# Patient Record
Sex: Female | Born: 1955
Health system: Southern US, Community
[De-identification: ages and names within clinical notes are randomized; demographics above are authoritative.]

## PROBLEM LIST (undated history)

## (undated) DIAGNOSIS — I444 Left anterior fascicular block: Secondary | ICD-10-CM

## (undated) DIAGNOSIS — E119 Type 2 diabetes mellitus without complications: Secondary | ICD-10-CM

## (undated) DIAGNOSIS — E079 Disorder of thyroid, unspecified: Secondary | ICD-10-CM

## (undated) DIAGNOSIS — J45909 Unspecified asthma, uncomplicated: Secondary | ICD-10-CM

## (undated) DIAGNOSIS — I10 Essential (primary) hypertension: Secondary | ICD-10-CM

## (undated) DIAGNOSIS — K219 Gastro-esophageal reflux disease without esophagitis: Secondary | ICD-10-CM

## (undated) DIAGNOSIS — E785 Hyperlipidemia, unspecified: Secondary | ICD-10-CM

## (undated) DIAGNOSIS — G4730 Sleep apnea, unspecified: Secondary | ICD-10-CM

## (undated) DIAGNOSIS — K589 Irritable bowel syndrome without diarrhea: Secondary | ICD-10-CM

## (undated) HISTORY — DX: Type 2 diabetes mellitus without complications: E11.9

## (undated) HISTORY — DX: Essential (primary) hypertension: I10

## (undated) HISTORY — DX: Hyperlipidemia, unspecified: E78.5

## (undated) HISTORY — DX: Unspecified asthma, uncomplicated: J45.909

## (undated) HISTORY — DX: Disorder of thyroid, unspecified: E07.9

## (undated) HISTORY — DX: Left anterior fascicular block: I44.4

---

## 1978-08-26 HISTORY — PX: TONSILLECTOMY: SUR1361

## 1988-08-26 HISTORY — PX: FUNCTIONAL ENDOSCOPIC SINUS SURGERY: SUR616

## 1998-02-07 ENCOUNTER — Other Ambulatory Visit: Admission: RE | Admit: 1998-02-07 | Discharge: 1998-02-07 | Payer: Self-pay | Admitting: Obstetrics and Gynecology

## 1998-06-12 ENCOUNTER — Encounter: Payer: Self-pay | Admitting: Orthopedic Surgery

## 1998-06-12 ENCOUNTER — Ambulatory Visit (HOSPITAL_COMMUNITY): Admission: RE | Admit: 1998-06-12 | Discharge: 1998-06-12 | Payer: Self-pay | Admitting: Orthopedic Surgery

## 1998-09-04 ENCOUNTER — Ambulatory Visit (HOSPITAL_BASED_OUTPATIENT_CLINIC_OR_DEPARTMENT_OTHER): Admission: RE | Admit: 1998-09-04 | Discharge: 1998-09-04 | Payer: Self-pay | Admitting: *Deleted

## 1998-10-19 ENCOUNTER — Ambulatory Visit (HOSPITAL_COMMUNITY): Admission: RE | Admit: 1998-10-19 | Discharge: 1998-10-19 | Payer: Self-pay | Admitting: *Deleted

## 1998-10-27 ENCOUNTER — Encounter: Payer: Self-pay | Admitting: Family Medicine

## 1998-10-27 ENCOUNTER — Ambulatory Visit (HOSPITAL_COMMUNITY): Admission: RE | Admit: 1998-10-27 | Discharge: 1998-10-27 | Payer: Self-pay | Admitting: Family Medicine

## 1998-11-02 ENCOUNTER — Ambulatory Visit (HOSPITAL_COMMUNITY): Admission: RE | Admit: 1998-11-02 | Discharge: 1998-11-02 | Payer: Self-pay | Admitting: Orthopedic Surgery

## 1998-12-22 ENCOUNTER — Ambulatory Visit (HOSPITAL_COMMUNITY): Admission: RE | Admit: 1998-12-22 | Discharge: 1998-12-22 | Payer: Self-pay | Admitting: Orthopaedic Surgery

## 1999-01-05 ENCOUNTER — Ambulatory Visit (HOSPITAL_COMMUNITY): Admission: RE | Admit: 1999-01-05 | Discharge: 1999-01-05 | Payer: Self-pay | Admitting: Orthopaedic Surgery

## 1999-01-19 ENCOUNTER — Ambulatory Visit (HOSPITAL_COMMUNITY): Admission: RE | Admit: 1999-01-19 | Discharge: 1999-01-19 | Payer: Self-pay | Admitting: Orthopaedic Surgery

## 1999-01-30 ENCOUNTER — Ambulatory Visit (HOSPITAL_COMMUNITY): Admission: RE | Admit: 1999-01-30 | Discharge: 1999-01-31 | Payer: Self-pay | Admitting: Orthopaedic Surgery

## 1999-03-06 ENCOUNTER — Other Ambulatory Visit: Admission: RE | Admit: 1999-03-06 | Discharge: 1999-03-06 | Payer: Self-pay | Admitting: Obstetrics and Gynecology

## 2000-03-06 ENCOUNTER — Other Ambulatory Visit: Admission: RE | Admit: 2000-03-06 | Discharge: 2000-03-06 | Payer: Self-pay | Admitting: Obstetrics and Gynecology

## 2000-07-16 ENCOUNTER — Ambulatory Visit (HOSPITAL_COMMUNITY): Admission: RE | Admit: 2000-07-16 | Discharge: 2000-07-16 | Payer: Self-pay | Admitting: Orthopaedic Surgery

## 2000-07-30 ENCOUNTER — Ambulatory Visit (HOSPITAL_COMMUNITY): Admission: RE | Admit: 2000-07-30 | Discharge: 2000-07-30 | Payer: Self-pay | Admitting: Orthopaedic Surgery

## 2000-08-14 ENCOUNTER — Ambulatory Visit (HOSPITAL_COMMUNITY): Admission: RE | Admit: 2000-08-14 | Discharge: 2000-08-14 | Payer: Self-pay | Admitting: *Deleted

## 2000-08-26 HISTORY — PX: LUMBAR LAMINECTOMY: SHX95

## 2001-02-04 ENCOUNTER — Encounter: Payer: Self-pay | Admitting: Gastroenterology

## 2001-02-04 ENCOUNTER — Ambulatory Visit (HOSPITAL_COMMUNITY): Admission: RE | Admit: 2001-02-04 | Discharge: 2001-02-04 | Payer: Self-pay | Admitting: Gastroenterology

## 2001-02-05 ENCOUNTER — Ambulatory Visit (HOSPITAL_COMMUNITY): Admission: RE | Admit: 2001-02-05 | Discharge: 2001-02-05 | Payer: Self-pay | Admitting: Gastroenterology

## 2001-02-12 ENCOUNTER — Encounter: Payer: Self-pay | Admitting: Gastroenterology

## 2001-02-12 ENCOUNTER — Ambulatory Visit (HOSPITAL_COMMUNITY): Admission: RE | Admit: 2001-02-12 | Discharge: 2001-02-12 | Payer: Self-pay | Admitting: Gastroenterology

## 2001-05-12 ENCOUNTER — Other Ambulatory Visit: Admission: RE | Admit: 2001-05-12 | Discharge: 2001-05-12 | Payer: Self-pay | Admitting: Obstetrics and Gynecology

## 2001-07-16 ENCOUNTER — Encounter: Admission: RE | Admit: 2001-07-16 | Discharge: 2001-07-16 | Payer: Self-pay | Admitting: Otolaryngology

## 2001-07-16 ENCOUNTER — Encounter: Payer: Self-pay | Admitting: Otolaryngology

## 2001-08-12 ENCOUNTER — Encounter: Admission: RE | Admit: 2001-08-12 | Discharge: 2001-08-12 | Payer: Self-pay | Admitting: Orthopaedic Surgery

## 2002-04-22 ENCOUNTER — Encounter: Payer: Self-pay | Admitting: Emergency Medicine

## 2002-04-22 ENCOUNTER — Emergency Department (HOSPITAL_COMMUNITY): Admission: EM | Admit: 2002-04-22 | Discharge: 2002-04-22 | Payer: Self-pay | Admitting: Emergency Medicine

## 2002-05-13 ENCOUNTER — Other Ambulatory Visit: Admission: RE | Admit: 2002-05-13 | Discharge: 2002-05-13 | Payer: Self-pay | Admitting: Obstetrics and Gynecology

## 2003-03-02 ENCOUNTER — Encounter: Payer: Self-pay | Admitting: Internal Medicine

## 2003-03-02 ENCOUNTER — Encounter: Admission: RE | Admit: 2003-03-02 | Discharge: 2003-03-02 | Payer: Self-pay | Admitting: Internal Medicine

## 2003-06-07 ENCOUNTER — Other Ambulatory Visit: Admission: RE | Admit: 2003-06-07 | Discharge: 2003-06-07 | Payer: Self-pay | Admitting: Obstetrics and Gynecology

## 2003-08-27 HISTORY — PX: LUMBAR LAMINECTOMY: SHX95

## 2003-09-16 ENCOUNTER — Ambulatory Visit (HOSPITAL_COMMUNITY): Admission: RE | Admit: 2003-09-16 | Discharge: 2003-09-17 | Payer: Self-pay | Admitting: Orthopaedic Surgery

## 2004-06-20 ENCOUNTER — Other Ambulatory Visit: Admission: RE | Admit: 2004-06-20 | Discharge: 2004-06-20 | Payer: Self-pay | Admitting: Obstetrics and Gynecology

## 2005-06-26 ENCOUNTER — Other Ambulatory Visit: Admission: RE | Admit: 2005-06-26 | Discharge: 2005-06-26 | Payer: Self-pay | Admitting: Obstetrics and Gynecology

## 2005-12-09 ENCOUNTER — Other Ambulatory Visit: Admission: RE | Admit: 2005-12-09 | Discharge: 2005-12-09 | Payer: Self-pay | Admitting: Obstetrics and Gynecology

## 2006-03-10 ENCOUNTER — Other Ambulatory Visit: Admission: RE | Admit: 2006-03-10 | Discharge: 2006-03-10 | Payer: Self-pay | Admitting: Obstetrics and Gynecology

## 2006-07-07 ENCOUNTER — Other Ambulatory Visit: Admission: RE | Admit: 2006-07-07 | Discharge: 2006-07-07 | Payer: Self-pay | Admitting: Obstetrics and Gynecology

## 2006-11-03 ENCOUNTER — Other Ambulatory Visit: Admission: RE | Admit: 2006-11-03 | Discharge: 2006-11-03 | Payer: Self-pay | Admitting: Obstetrics and Gynecology

## 2007-03-05 ENCOUNTER — Encounter: Admission: RE | Admit: 2007-03-05 | Discharge: 2007-03-05 | Payer: Self-pay | Admitting: Orthopaedic Surgery

## 2007-07-13 ENCOUNTER — Other Ambulatory Visit: Admission: RE | Admit: 2007-07-13 | Discharge: 2007-07-13 | Payer: Self-pay | Admitting: Obstetrics and Gynecology

## 2011-01-11 NOTE — Procedures (Signed)
[place]  Patient:    Heather Stout, Heather Stout                     MRN: [id number] Proc. Date: 02/05/01 Adm. Date:  [id number] Attending:  Charna Elizabeth CC:         Soyla Murphy. Renne Crigler, M.D.   Procedure Report  DATE OF BIRTH:  November 25, 1965  REFERRING PHYSICIAN:  Soyla Murphy. Renne Crigler, M.D.  PROCEDURE PERFORMED:  Colonoscopy.  ENDOSCOPIST:  Anselmo Rod, M.D.  INSTRUMENT USED:  Olympus video colonoscope.  INDICATIONS FOR PROCEDURE:  Rectal bleeding and severe diarrhea in a 55 year old white female rule out colonic polyps, masses, hemorrhoids, etc.  PREPROCEDURE PREPARATION:  Informed consent was procured from the patient. The patient was fasted for eight hours prior to the procedure and prepped with a bottle of magnesium citrate and a gallon of NuLytely the night prior to the procedure.  PREPROCEDURE PHYSICAL:  The patient had stable vital signs.  Neck supple. Chest clear to auscultation.  S1, S2 regular.  Abdomen soft with normal abdominal bowel sounds.  DESCRIPTION OF PROCEDURE:  The patient was placed in the left lateral decubitus position and sedated with 70 mg of Demerol and 7 mg of Versed intravenously.  Once the patient was adequately sedated and maintained on low-flow oxygen and continuous cardiac monitoring, the Olympus video colonoscope was advanced from the rectum to the cecum and terminal ileum without difficulty.  Except for small internal hemorrhoids, the entire colonic mucosa appeared healthy with a normal vascular pattern.  No erosions, ulcerations, masses or polyps were seen.  IMPRESSION:  Healthy-appearing colon up to the terminal ileum except for small nonbleeding internal hemorrhoids.  RECOMMENDATIONS: 1. It is my suspicion that the patient suffers from severe irritable bowel    syndrome, so a high fiber diet has been advised. 2. Outpatient follow-up is advised in the next two weeks or earlier if needed.DD:  02/05/01 TD:   02/06/01 Job: [id number]

## 2011-01-11 NOTE — Op Note (Signed)
NAME:  Heather Stout, Heather Stout                        ACCOUNT NO.:  [id number]   MEDICAL RECORD NO.:  [id number]                   PATIENT TYPE:  OIB   LOCATION:  2550                                 FACILITY:  [facility]   PHYSICIAN:  Mark C. Ophelia Charter, M.D.                 DATE OF BIRTH:  12/21/1955   DATE OF PROCEDURE:  09/16/2003  DATE OF DISCHARGE:                                 OPERATIVE REPORT   PREOPERATIVE DIAGNOSIS:  Left L5-S1 recurrent herniated nucleus pulposus.   POSTOPERATIVE DIAGNOSIS:  Left L5-S1 recurrent herniated nucleus pulposus.   PROCEDURE:  L5-S1 left microdiskectomy for recurrent herniated nucleus  pulposus.   SURGEON:  Mark C. Ophelia Charter, M.D.   ANESTHESIA:  General endotracheal anesthesia.   ESTIMATED BLOOD LOSS:  Less than 100 cc.   HISTORY:  This 55 year old female had an L5-S1 herniated nucleus pulposus  five years ago in June of 2000.  She did well for a period of time and has  had some recurrent episodes which have been severe for the last few months  with recurrent radicular symptoms and MRI positive for recurrent herniated  nucleus pulposus.  Discussion about fusion was held with the patient and  since she has adjacent disk degenerative changes at L4-5 as well, she  elected to proceed with recurrent micro diskectomy for her radicular  symptoms.  After the first procedure she did well for a period of about  three years.   DESCRIPTION OF PROCEDURE:  After the induction of general anesthesia,  preoperative Ancef prophylaxis, the patient was placed on the Andrews frame  in the kneeling position.  The back was prepped with Duraprep, squared with  towels, sterile skin marker used on the old skin, and Betadine and Ioban  drape application.  Laminectomy sheet and drape.  The back was exposed.  Subperiosteal dissection down to the interlaminar space on the left was  performed.  The #4 was placed lateral in the gutter.  Cross-table lateral x-  ray confirmed it was just  below the level of the disk space.  The bone was  cleaned off with cup curet.  Once the margin of the bone was identified,  laminotomy was performed taking almost all the lamina on the left at L5 and  the top portion of the S1 lamina.  The nerve root was identified and there  was some recurrent ligament in the gutter.  This was removed and the nerve  root was stuck down with a wad of disk material that was recurrent, sticking  to the anterior portion of the dura.  Using the operative microscope with  micro dissection technique, using the titanium micro dissection instruments  and a Black nerve hook, ball-tipped nerve hook, dural separator, and  D'Errico, and a 15-scalpel blade, piece by piece the recurrent disk fragment  was cut loose and it was gently separated from the dura with micro  dissection techniques.  This continued until there was complete  decompression.  The disk space was collapsed and a #4 Penfield tip would  just enter the space and micro pituitary could only be introduced about 5 mm  into the disk space.  There was no disk felt laterally and hockey-stick was  run anterior to the dura with a 180-degree clean sweep, as well as out the  foramina in front of the nerve root, over the shoulder, and down the axilla  of the nerve root with no areas of compression.  There was some small amount  of remaining disk near the midline and the scope was angled progressively  further and further and using the D'Errico and Epstein curets, the pieces  gently separated from the dura, cut with a 15-scalpel blade, removed with  the micro pituitary.  With full decompression and no compression anterior to  the dura and no  compression on the nerve root, the area was irrigated, 0 Vicryl placed in  the fascia, 2-0 in the subcutaneous tissue, 4-0 Vicryl subcuticular skin  closure, tincture of benzoin, Steri-Strips, Marcaine infiltration, Adaptic,  4x4s, and tape dressing.  Instrument count and needle  count was correct.                                               Mark C. Ophelia Charter, M.D.    MCY/MEDQ  D:  09/16/2003  T:  09/16/2003  Job:  [id number]

## 2011-08-27 HISTORY — PX: KNEE ARTHROSCOPY: SUR90

## 2014-03-26 ENCOUNTER — Encounter: Payer: Self-pay | Admitting: *Deleted

## 2014-10-29 ENCOUNTER — Encounter: Payer: 59 | Attending: Internal Medicine

## 2014-10-29 VITALS — Ht 67.0 in | Wt 193.7 lb

## 2014-10-29 DIAGNOSIS — E119 Type 2 diabetes mellitus without complications: Secondary | ICD-10-CM | POA: Insufficient documentation

## 2014-10-29 DIAGNOSIS — Z713 Dietary counseling and surveillance: Secondary | ICD-10-CM | POA: Insufficient documentation

## 2014-10-29 DIAGNOSIS — Z794 Long term (current) use of insulin: Secondary | ICD-10-CM | POA: Diagnosis not present

## 2014-10-29 NOTE — Progress Notes (Signed)
Patient was seen on 10/29/14 for the complete diabetes self-management series at the [facility]. This is a part of the Link to [employer].  Handouts given during class include:  Living Well with Diabetes book  Carb Counting and Meal Planning book  Meal Plan Card  Carbohydrate guide  Meal planning worksheet  Low Sodium Flavoring Tips  The diabetes portion plate  Low Carbohydrate Snack Suggestions  A1c to eAG Conversion Chart  Diabetes Medications  Stress Management  Diabetes Recommended Care Schedule  Diabetes Success Plan  Core Class Satisfaction Survey  The following learning objectives were met by the patient during this course:  Describe diabetes  State some common risk factors for diabetes  Defines the role of glucose and insulin  Identifies type of diabetes and pathophysiology  Describe the relationship between diabetes and cardiovascular risk  State the members of the Healthcare Team  States the rationale for glucose monitoring  State when to test glucose  State their individual Target Range  State the importance of logging glucose readings  Describe how to interpret glucose readings  Identifies A1C target  Explain the correlation between A1c and eAG values  State symptoms and treatment of high blood glucose  State symptoms and treatment of low blood glucose  Explain proper technique for glucose testing  Identifies proper sharps disposal  Describe the role of different macronutrients on glucose  Explain how carbohydrates affect blood glucose  State what foods contain the most carbohydrates  Demonstrate carbohydrate counting  Demonstrate how to read Nutrition Facts food label  Describe effects of various fats on heart health  Describe the importance of good nutrition for health and healthy eating strategies  Describe techniques for managing your shopping, cooking and meal planning  List  strategies to follow meal plan when dining out  Describe the effects of alcohol on glucose and how to use it safely . State the amount of activity recommended for healthy living . Describe activities suitable for individual needs . Identify ways to regularly incorporate activity into daily life . Identify barriers to activity and ways to over come these barriers  Identify diabetes medications being personally used and their primary action for lowering glucose and possible side effects . Describe role of stress on blood glucose and develop strategies to address psychosocial issues . Identify diabetes complications and ways to prevent them  Explain how to manage diabetes during illness . Evaluate success in meeting personal goal . Establish 2-3 goals that they will plan to diligently work on until they return for the  76-monthfollow-up visit  Goals:   I will count my carb choices at most meals and snacks  I will take my diabetes medications as scheduled  I will test my glucose at least 1 times a day, 7 days a week  Your patient has identified these potential barriers to change:  Lack of Family Support  Your patient has identified their diabetes self-care support plan as  Link to Wellness Program   Plan[facility]

## 2014-11-29 ENCOUNTER — Other Ambulatory Visit: Payer: Self-pay

## 2014-11-29 VITALS — BP 120/80 | HR 79 | Ht 67.0 in | Wt 194.2 lb

## 2014-11-29 DIAGNOSIS — E119 Type 2 diabetes mellitus without complications: Secondary | ICD-10-CM

## 2014-11-29 NOTE — Patient Instructions (Signed)
1. Plan to check blood sugar daily either fasting or 1 -2hrs after a meal.  Goals of 80-130 fasting and less than 180 after eating.  Diabetes and Exercise Exercising regularly is important. It is not just about losing weight. It has many health benefits, such as:  Improving your overall fitness, flexibility, and endurance.  Increasing your bone density.  Helping with weight control.  Decreasing your body fat.  Increasing your muscle strength.  Reducing stress and tension.  Improving your overall health. People with diabetes who exercise gain additional benefits because exercise:  Reduces appetite.  Improves the body's use of blood sugar (glucose).  Helps lower or control blood glucose.  Decreases blood pressure.  Helps control blood lipids (such as cholesterol and triglycerides).  Improves the body's use of the hormone insulin by:  Increasing the body's insulin sensitivity.  Reducing the body's insulin needs.  Decreases the risk for heart disease because exercising:  Lowers cholesterol and triglycerides levels.  Increases the levels of good cholesterol (such as high-density lipoproteins [HDL]) in the body.  Lowers blood glucose levels. YOUR ACTIVITY PLAN  Choose an activity that you enjoy and set realistic goals. Your health care provider or diabetes educator can help you make an activity plan that works for you. Exercise regularly as directed by your health care provider. This includes:  Performing resistance training twice a week such as push-ups, sit-ups, lifting weights, or using resistance bands.  Performing 150 minutes of cardio exercises each week such as walking, running, or playing sports.  Staying active and spending no more than 90 minutes at one time being inactive. Even short bursts of exercise are good for you. Three 10-minute sessions spread throughout the day are just as beneficial as a single 30-minute session. Some exercise ideas  include:  Taking the dog for a walk.  Taking the stairs instead of the elevator.  Dancing to your favorite song.  Doing an exercise video.  Doing your favorite exercise with a friend. RECOMMENDATIONS FOR EXERCISING WITH TYPE 1 OR TYPE 2 DIABETES   Check your blood glucose before exercising. If blood glucose levels are greater than 240 mg/dL, check for urine ketones. Do not exercise if ketones are present.  Avoid injecting insulin into areas of the body that are going to be exercised. For example, avoid injecting insulin into:  The arms when playing tennis.  The legs when jogging.  Keep a record of:  Food intake before and after you exercise.  Expected peak times of insulin action.  Blood glucose levels before and after you exercise.  The type and amount of exercise you have done.  Review your records with your health care provider. Your health care provider will help you to develop guidelines for adjusting food intake and insulin amounts before and after exercising.  If you take insulin or oral hypoglycemic agents, watch for signs and symptoms of hypoglycemia. They include:  Dizziness.  Shaking.  Sweating.  Chills.  Confusion.  Drink plenty of water while you exercise to prevent dehydration or heat stroke. Body water is lost during exercise and must be replaced.  Talk to your health care provider before starting an exercise program to make sure it is safe for you. Remember, almost any type of activity is better than none. Document Released: 11/02/2003 Document Revised: 12/27/2013 Document Reviewed: 01/19/2013 [facility] Patient Information 2015 [place], [place]. This information is not intended to replace advice given to you by your health care provider. Make sure you discuss any questions you  have with your health care provider.  

## 2014-11-29 NOTE — Patient Outreach (Signed)
[place] [facility]) Care Management   11/29/2014  MERLIN EGE [birth year] [id number]  Heather Stout is an 59 y.o. female.  Member seen for initial office visit for Link to Wellness program for self management of Type 2 diabetes  Subjective: Member states that she is motivated to keep her diabetes in control and to get free medications.  States that she has been keeping her hemoglobin A1C below 7 but states that last year it did go up without any changes in her diet or lifestyle.  States that is when they started her on Invokana which has helped with her readings.  States she has difficulty with being consistent with exercise because she will develop joint pain.  Objective:   Review of Systems  All other systems reviewed and are negative.   Physical Exam  Filed Vitals:   11/29/14 1523  BP: 120/80  Pulse: 79    Current Medications:   Current Outpatient Prescriptions  Medication Sig Dispense Refill  . albuterol (PROVENTIL HFA;VENTOLIN HFA) 108 (90 BASE) MCG/ACT inhaler Inhale into the lungs every 6 (six) hours as needed for wheezing or shortness of breath.    Marland Kitchen aspirin 81 MG tablet Take 81 mg by mouth daily.    . Azilsartan-Chlorthalidone (EDARBYCLOR) 40-12.5 MG TABS Take by mouth.    . Calcium Carbonate-Vitamin D (CALCIUM 500 + D) 500-125 MG-UNIT TABS Take by mouth.    . canagliflozin (INVOKANA) 100 MG TABS tablet Take 100 mg by mouth daily.    . cholecalciferol (VITAMIN D) 1000 UNITS tablet Take 1,000 Units by mouth daily.    . cholestyramine (QUESTRAN) 4 G packet Take 4 g by mouth 2 (two) times daily as needed.    . Cyanocobalamin (VITAMIN B 12 PO) Take by mouth.    . diphenhydrAMINE (BENADRYL) 25 MG tablet Take 25 mg by mouth at bedtime as needed.    . fenofibrate (TRICOR) 145 MG tablet Take 145 mg by mouth daily.    . insulin glargine (LANTUS) 100 UNIT/ML injection Inject 56 Units into the skin at bedtime.    . Liraglutide 18 MG/3ML SOPN Inject 18 mg into the  skin.    . Magnesium 250 MG TABS Take 1 tablet by mouth.    . Melatonin 10 MG TABS Take 2 tablets by mouth at bedtime.    Marland Kitchen omeprazole (PRILOSEC) 20 MG capsule Take 20 mg by mouth 2 (two) times daily before a meal.    . Probiotic Product (PROBIOTIC PO) Take 1 capsule by mouth.    . Exenatide ER (BYDUREON) 2 MG PEN Inject into the skin once a week.    Marland Kitchen glipiZIDE (GLUCOTROL) 5 MG tablet Take by mouth daily before breakfast.     No current facility-administered medications for this visit.    Functional Status:   In your present state of health, do you have any difficulty performing the following activities: 11/29/2014  Is the patient deaf or have difficulty hearing? N  Hearing N  Vision N  Difficulty concentrating or making decisions N  Walking or climbing stairs? N  Doing errands, shopping? N    Fall/Depression Screening:    PHQ 2/9 Scores 11/29/2014 10/29/2014  PHQ - 2 Score 0 0   THN CM Care Plan        Patient Outreach from 11/29/2014 in [place] Problem One  Potential for elevated blood sugars   Care Plan for Problem One  Active   Interventions  for Problem One Long Term Goal  Reviewed carbohydrate counting portion sizes. Instructed on the importance of regular exercise to control blood sugars,Discussed exercises that would be less stress on her joints and encouraged to discuss with trainer, Issued True Result glucometer and  demonstrated  use   THN Long Term Goal (31-90 days)  Member will maintain hemoglobin A1C of 7.0 or lower for the next 90 days   THN Long Term Goal Start Date  11/29/14      Assessment:  Member has good knowledge of CHO counting and disease processes.  She needs to increase activity and she is encouraged to discuss different exercises she can do with a trainer at her gym that will be less stress on her joints.     Plan: Member to return to Link to Wellness on 04/04/15 as she will see her primary care provider in  July

## 2014-11-30 ENCOUNTER — Other Ambulatory Visit: Payer: Self-pay

## 2015-04-04 ENCOUNTER — Other Ambulatory Visit: Payer: Self-pay

## 2015-04-04 VITALS — BP 118/70 | HR 85 | Resp 16 | Ht 67.0 in | Wt 191.0 lb

## 2015-04-04 DIAGNOSIS — E785 Hyperlipidemia, unspecified: Secondary | ICD-10-CM | POA: Insufficient documentation

## 2015-04-04 DIAGNOSIS — K589 Irritable bowel syndrome without diarrhea: Secondary | ICD-10-CM | POA: Insufficient documentation

## 2015-04-04 DIAGNOSIS — E119 Type 2 diabetes mellitus without complications: Secondary | ICD-10-CM

## 2015-04-04 DIAGNOSIS — I10 Essential (primary) hypertension: Secondary | ICD-10-CM | POA: Insufficient documentation

## 2015-04-04 NOTE — Patient Instructions (Signed)
1. Plan to eat 30-45 GM (2-3) servings of carbohydrate a meal and 15 GM for snacks. Plan to eat protein with snacks 2. Plan to check blood sugar 1-2 either fasting or 1 -2hrs after a meal.  Goals of 80-110 fasting and less than 140 after eating. 3. Plan to walk 3 times a week for 20 minutes 4. Plan to see pharmacist at Dr. Pennie Banter office in November 5. Plan to see Link to [facility] September 05, 2015

## 2015-04-04 NOTE — Patient Outreach (Addendum)
[place] [facility]) Care Management   04/04/2015  Heather Stout [birth year] [id number]  Heather Stout is an 59 y.o. female.   Member seen for follow up office visit for Link to Wellness program for self management of Type 2 diabetes  Subjective: Member states that she saw Heather Stout the PharmD at [place] office yesterday and she increased her Invokana to 300 mg.  Member states she would like to get her hemoglobin A1C lower and her last reading was 6.7 on 03/23/15.  States she wants her to check her blood sugars  2 hours after eating supper to see if the medication change has helped.  States she is trying to follow a low CHO diet and she has been eating more fresh vegetables from the garden.  States she has not been exercising because of the hot weather and time contrains  Objective:   Review of Systems  All other systems reviewed and are negative. Member did not bring glucometer to visit but reviewed log Fasting ranges 79-164   Physical Exam  Today's Vitals   04/04/15 1132  BP: 118/70  Pulse: 85  Resp: 16  Height: 1.702 m (5\' 7" )  Weight: 191 lb (86.637 kg)  SpO2: 96%  PainSc: 0-No pain    Current Medications:   Current Outpatient Prescriptions  Medication Sig Dispense Refill  . albuterol (PROVENTIL HFA;VENTOLIN HFA) 108 (90 BASE) MCG/ACT inhaler Inhale into the lungs every 6 (six) hours as needed for wheezing or shortness of breath.    Marland Kitchen aspirin 81 MG tablet Take 81 mg by mouth daily.    . Azilsartan-Chlorthalidone (EDARBYCLOR) 40-12.5 MG TABS Take by mouth.    . Calcium Carbonate-Vitamin D (CALCIUM 500 + D) 500-125 MG-UNIT TABS Take by mouth.    . canagliflozin (INVOKANA) 300 MG TABS tablet Take 300 mg by mouth daily before breakfast.    . cholecalciferol (VITAMIN D) 1000 UNITS tablet Take 2,000 Units by mouth daily.     . cholestyramine (QUESTRAN) 4 G packet Take 4 g by mouth 2 (two) times daily as needed.    . Cyanocobalamin (VITAMIN B 12 PO) Take by mouth.    .  fenofibrate (TRICOR) 145 MG tablet Take 145 mg by mouth daily.    . insulin glargine (LANTUS) 100 UNIT/ML injection Inject 56 Units into the skin daily.     . insulin lispro (HUMALOG KWIKPEN) 100 UNIT/ML KiwkPen Inject 8 Units into the skin daily before supper.    . Liraglutide 18 MG/3ML SOPN Inject 18 mg into the skin.    . Magnesium 250 MG TABS Take 1 tablet by mouth.    . Melatonin 10 MG TABS Take 1 tablet by mouth at bedtime.     Marland Kitchen omeprazole (PRILOSEC) 20 MG capsule Take 20 mg by mouth daily.     . Probiotic Product (PROBIOTIC PO) Take 1 capsule by mouth.    . canagliflozin (INVOKANA) 100 MG TABS tablet Take 100 mg by mouth daily. Dose changed    . diphenhydrAMINE (BENADRYL) 25 MG tablet Take 25 mg by mouth at bedtime as needed.    . Exenatide ER (BYDUREON) 2 MG PEN Inject into the skin once a week.    Marland Kitchen glipiZIDE (GLUCOTROL) 5 MG tablet Take by mouth daily before breakfast.     No current facility-administered medications for this visit.    Functional Status:   In your present state of health, do you have any difficulty performing the following activities: 04/04/2015 11/29/2014  Hearing? N N  Vision? N N  Difficulty concentrating or making decisions? N N  Walking or climbing stairs? N N  Dressing or bathing? N N  Doing errands, shopping? N N    Fall/Depression Screening:    PHQ 2/9 Scores 04/04/2015 11/29/2014 10/29/2014  PHQ - 2 Score 0 0 0   THN CM Care Plan Problem One        Patient Outreach from 04/04/2015 in [place] Problem One  Potential for elevated blood sugars   Care Plan for Problem One  Active   THN Long Term Goal (31-90 days)  Member will maintain hemoglobin A1C of 7.0 or lower for the next 90 days   THN Long Term Goal Start Date  04/04/15 [Hemoglobin A1C 6.7 on 03/23/15]   Interventions for Problem One Long Term Goal  Reviewed carbohydrate counting portion sizes. Reinforced on the importance of regular exercise to control blood sugars,Instructed  to keep appointment with PharmD at [place] office in November, Reviewed blood sugar goals fasting and post prandial,      Assessment:   Member seen for follow up office visit for Link to Wellness program for self management of Type 2 diabetes.  Member has decreased hemoglobin A1C to 6.7.  She is working with PharmD at [place] office to adjust her medications to lower her CBGs.  She reports she is following a low CHO diet.  She has not been exercising regularly.  She does plan to walk with her Mother to prepare for a trip to [facility].    Plan:  Plan to eat 30-45 GM (2-3) servings of carbohydrate a meal and 15 GM for snacks. Plan to eat protein with snacks Plan to check blood sugar 1-2 either fasting or 1 -2hrs after a meal.  Goals of 80-110 fasting and less than 140 after eating. Plan to walk 3 times a week for 20 minutes Plan to see pharmacist at Dr. Pennie Banter office in November Plan to see Heather Stout to Wellness September 05, 2015  Heather Garter RN, [facility] Care Management Coordinator-Link to [place] Management [phone]

## 2015-05-11 ENCOUNTER — Ambulatory Visit (HOSPITAL_BASED_OUTPATIENT_CLINIC_OR_DEPARTMENT_OTHER): Payer: 59 | Attending: Internal Medicine | Admitting: *Deleted

## 2015-05-11 VITALS — Ht 67.0 in | Wt 189.0 lb

## 2015-05-11 DIAGNOSIS — G4733 Obstructive sleep apnea (adult) (pediatric): Secondary | ICD-10-CM | POA: Diagnosis not present

## 2015-05-11 DIAGNOSIS — R0683 Snoring: Secondary | ICD-10-CM | POA: Insufficient documentation

## 2015-05-13 DIAGNOSIS — G4733 Obstructive sleep apnea (adult) (pediatric): Secondary | ICD-10-CM | POA: Diagnosis not present

## 2015-05-13 NOTE — Progress Notes (Signed)
Patient Name: Heather Stout, Heather Stout Date: 05/11/2015 Gender: Female D.O.B: 12-13-1955 Age (years): 59 Referring Provider: Deland Pretty Height (inches): 5 Interpreting Physician: Baird Lyons MD, ABSM Weight (lbs): 189 RPSGT: Gerhard Perches BMI: 30 MRN: [id number] Neck Size: 15.00 CLINICAL INFORMATION Sleep Study Type: Split Night CPAP  Indication for sleep study: Snoring  Epworth Sleepiness Score: 10  SLEEP STUDY TECHNIQUE As per the AASM Manual for the Scoring of Sleep and Associated Events v2.3 (April 2016) with a hypopnea requiring 4% desaturations.  The channels recorded and monitored were frontal, central and occipital EEG, electrooculogram (EOG), submentalis EMG (chin), nasal and oral airflow, thoracic and abdominal wall motion, anterior tibialis EMG, snore microphone, electrocardiogram, and pulse oximetry. Continuous positive airway pressure (CPAP) was initiated when the patient met split night criteria and was titrated according to treat sleep-disordered breathing.  MEDICATIONS Medications taken by the patient : charted for review Medications administered by patient during sleep study : Sleep medicine administered - Tylenol PM at 10:00:34 PM  RESPIRATORY PARAMETERS Diagnostic  Total AHI (/hr): 32.3 RDI (/hr): 32.3 OA Index (/hr): 5.2 CA Index (/hr): 1.0 REM AHI (/hr): 0.0 NREM AHI (/hr): 32.5 Supine AHI (/hr): 27.5 Non-supine AHI (/hr): 39.88 Min O2 Sat (%): 81.00 Mean O2 (%): 91.24 Time below 88% (min): 28.2   Titration  Optimal Pressure (cm): 9 AHI at Optimal Pressure (/hr): 1.6 Min O2 at Optimal Pressure (%): 91.0 Supine % at Optimal (%): 5 Sleep % at Optimal (%): 95   SLEEP ARCHITECTURE The recording time for the entire night was 366.3 minutes.  During a baseline period of 193.6 minutes, the patient slept for 126.1 minutes in REM and nonREM, yielding a sleep efficiency of 65.2%. Sleep onset after lights out was 24.5 minutes with a REM latency of 124.5  minutes. The patient spent 13.08% of the night in stage N1 sleep, 53.91% in stage N2 sleep, 32.61% in stage N3 and 0.40% in REM.  During the titration period of 170.0 minutes, the patient slept for 134.0 minutes in REM and nonREM, yielding a sleep efficiency of 78.8%. Sleep onset after CPAP initiation was 0.0 minutes with a REM latency of 81.1 minutes. The patient spent 5.22% of the night in stage N1 sleep, 59.70% in stage N2 sleep, 24.25% in stage N3 and 10.82% in REM.  CARDIAC DATA The 2 lead EKG demonstrated sinus rhythm. The mean heart rate was 71.57 beats per minute. Other EKG findings include: None.  LEG MOVEMENT DATA The total Periodic Limb Movements of Sleep (PLMS) were 255. The PLMS index was 58.20 . Limb movement with arousal : 4/ hr.  IMPRESSIONS Severe obstructive sleep apnea occurred during the diagnostic portion of the study (AHI = 32.3/hour). An optimal PAP pressure was selected for this patient ( 9 cm of water) No significant central sleep apnea occurred during the diagnostic portion of the study (CAI = 1.0/hour). The patient had minimal or no oxygen desaturation during the diagnostic portion of the study (Min O2 = 81.00%) The patient snored with Moderate snoring volume during the diagnostic portion of the study. No cardiac abnormalities were noted during this study. Clinically significant periodic limb movements with arousal did not occur during sleep.  DIAGNOSIS Obstructive Sleep Apnea (327.23 [G47.33 ICD-10])  RECOMMENDATIONS Trial of CPAP therapy on 9 cm H2O with a Small size Resmed Nasal Pillow Mask AirFit P10 mask and heated humidification. Avoid alcohol, sedatives and other CNS depressants that may worsen sleep apnea and disrupt normal sleep architecture. Sleep hygiene should be reviewed to  assess factors that may improve sleep quality. Weight management and regular exercise should be initiated or continued.    Deneise Lever Diplomate, American Board of Sleep  Medicine  ELECTRONICALLY SIGNED ON:  05/13/2015, 11:23 AM [place] PH: [phone]   FX: [phone] [place]

## 2015-06-29 ENCOUNTER — Institutional Professional Consult (permissible substitution): Payer: 59 | Admitting: Pulmonary Disease

## 2015-06-29 ENCOUNTER — Other Ambulatory Visit (HOSPITAL_COMMUNITY): Payer: Self-pay | Admitting: Respiratory Therapy

## 2015-06-29 DIAGNOSIS — R05 Cough: Secondary | ICD-10-CM

## 2015-06-29 DIAGNOSIS — R059 Cough, unspecified: Secondary | ICD-10-CM

## 2015-07-07 ENCOUNTER — Ambulatory Visit (HOSPITAL_COMMUNITY)
Admission: RE | Admit: 2015-07-07 | Discharge: 2015-07-07 | Disposition: A | Discharge status: Home / Self Care | Payer: 59 | Source: Ambulatory Visit | Attending: Internal Medicine | Admitting: Internal Medicine

## 2015-07-07 DIAGNOSIS — R05 Cough: Secondary | ICD-10-CM | POA: Diagnosis present

## 2015-07-07 DIAGNOSIS — R059 Cough, unspecified: Secondary | ICD-10-CM

## 2015-07-07 LAB — SPIROMETRY WITH GRAPH
FEF 25-75 PRE: 1.62 L/s
FEF2575-%PRED-PRE: 67 %
FEV1-%Pred-Pre: 79 %
FEV1-Pre: 2.1 L
FEV1FVC-%PRED-PRE: 87 %
FEV6-%Pred-Pre: 93 %
FEV6-Pre: 3.09 L
FEV6FVC-%PRED-PRE: 102 %
FVC-%Pred-Pre: 89 %
FVC-PRE: 3.09 L
Pre FEV1/FVC ratio: 68 %
Pre FEV6/FVC Ratio: 100 %

## 2015-07-26 ENCOUNTER — Encounter: Payer: Self-pay | Admitting: Pulmonary Disease

## 2015-07-27 ENCOUNTER — Encounter: Payer: Self-pay | Admitting: Pulmonary Disease

## 2015-07-27 ENCOUNTER — Ambulatory Visit (INDEPENDENT_AMBULATORY_CARE_PROVIDER_SITE_OTHER): Payer: 59 | Admitting: Pulmonary Disease

## 2015-07-27 ENCOUNTER — Institutional Professional Consult (permissible substitution): Payer: 59 | Admitting: Pulmonary Disease

## 2015-07-27 ENCOUNTER — Other Ambulatory Visit: Payer: 59

## 2015-07-27 VITALS — BP 144/88 | HR 77 | Temp 98.0°F | Ht 67.0 in | Wt 191.6 lb

## 2015-07-27 DIAGNOSIS — R0600 Dyspnea, unspecified: Secondary | ICD-10-CM

## 2015-07-27 NOTE — Patient Instructions (Signed)
We will schedule you for a full set of PFTs Check alpha 1 antitrypsin levels Continue the albuterol as needed and Protonix Continue the Flonase

## 2015-07-27 NOTE — Progress Notes (Signed)
Subjective:    Patient ID: Heather Stout, female    DOB: Oct 02, 1955, 59 y.o.   MRN: [id number]  HPI Consult for evaluation of cough  Heather Stout is a 59 year old history of chronic cough. She reports having cough for many many years. She has history of sinusitis. She was found to have fungus infection in the sinus around 1995. She had to be on amphotericin for some time. She also underwent an FEES. She continues to be on Zyrtec and still has persistent symptoms of rhinitis, postnasal drip. She saw an allergist several years ago who advised her to take omeprazole for laryngeal pharyngeal reflux even though she does not have any overt symptoms of GERD. She reports that the omeprazole has improved her cough a lot. Now she reports a different kind of cough with chest congestion. Is not associated with shortness of breath or wheezing. It is nonproductive in nature. There is no hemoptysis. Cough is exacerbated by laughing, humidity, rain, strong perfumes, strong odors. She's had a chest x-ray on 06/29/15. I do not have these images to review but is reportedly normal. She's had PFTs on 07/07/15 which showed mild-moderate obstruction  She is a never smoker and has a history of childhood asthma. She reports that her mother is a never smoker has Emphysema and feels that she might be developing at as well. She has a recent diagnosis of sleep apnea. Started on CPAP in October 2016. She reports markedly improved sleep quality on using the CPAP.  DATA: PFTs 07/07/15 FVC 3.09 [89%] FEV1 2.10 [79%] F/F 68  Chest x-ray 06/29/15 No active cardiopulmonary disease.  Past Medical History  Diagnosis Date  . Left anterior hemiblock   . Type II or unspecified type diabetes mellitus without mention of complication, not stated as uncontrolled   . Essential hypertension, benign   . Other and unspecified hyperlipidemia   . Asthma     Current outpatient prescriptions:  .  albuterol (PROVENTIL HFA;VENTOLIN HFA)  108 (90 BASE) MCG/ACT inhaler, Inhale into the lungs every 6 (six) hours as needed for wheezing or shortness of breath., Disp: , Rfl:  .  aspirin 81 MG tablet, Take 81 mg by mouth daily., Disp: , Rfl:  .  Azilsartan-Chlorthalidone (EDARBYCLOR) 40-12.5 MG TABS, Take by mouth., Disp: , Rfl:  .  Calcium Carbonate-Vitamin D (CALCIUM 500 + D) 500-125 MG-UNIT TABS, Take by mouth., Disp: , Rfl:  .  canagliflozin (INVOKANA) 300 MG TABS tablet, Take 300 mg by mouth daily before breakfast., Disp: , Rfl:  .  cetirizine (ZYRTEC) 10 MG tablet, Take 10 mg by mouth daily., Disp: , Rfl:  .  cholecalciferol (VITAMIN D) 1000 UNITS tablet, Take 2,000 Units by mouth daily. , Disp: , Rfl:  .  cholestyramine (QUESTRAN) 4 GM/DOSE powder, Take by mouth daily., Disp: , Rfl:  .  Cyanocobalamin (VITAMIN B 12 PO), Take by mouth., Disp: , Rfl:  .  fenofibrate (TRICOR) 145 MG tablet, Take 145 mg by mouth daily., Disp: , Rfl:  .  insulin glargine (LANTUS) 100 UNIT/ML injection, Inject 56 Units into the skin daily. , Disp: , Rfl:  .  insulin lispro (HUMALOG KWIKPEN) 100 UNIT/ML KiwkPen, Inject 8 Units into the skin daily before supper., Disp: , Rfl:  .  Liraglutide 18 MG/3ML SOPN, Inject 18 mg into the skin., Disp: , Rfl:  .  Magnesium 250 MG TABS, Take 1 tablet by mouth., Disp: , Rfl:  .  omeprazole (PRILOSEC) 20 MG capsule, Take 20 mg by  mouth daily. , Disp: , Rfl:  .  Probiotic Product (PROBIOTIC PO), Take 1 capsule by mouth., Disp: , Rfl:   Review of Systems Complains of cough, nonproductive in nature. No wheezing, dyspnea No chest pain, palpitations No nausea, vomiting, diarrhea, constipation. All other review of systems are negative     Objective:   Physical Exam   Blood pressure 144/88, pulse 77, temperature 98 F (36.7 C), temperature source Oral, height 5\' 7"  (1.702 m), weight 191 lb 9.6 oz (86.909 kg), SpO2 94 %.  Gen: No apparent distress Neuro: No gross focal deficits. Neck: No JVD, lymphadenopathy,  thyromegaly. RS: Clear, no wheeze, crackles CVS: S1-S2 heard, no murmurs rubs gallops. Abdomen: Soft, positive bowel sounds. Extremities: No edema.     Assessment & Plan:  Chronic cough This could be recurrence of her childhood asthma. This is suggested by her exacerbation with strong odors and perfumes.The chronic sinusitis, postnasal drip, GERD, LPR is contributing to this issue. I will get a full set of PFTs, she'll continue on her albuterol when necessary, Protonix and start Flonase nasal spray.  Her PFTs show mild-moderate obstruction in a never smoker. While this could be from asthma it is interesting that her mother is a never smoker too has developed emphysema. I'll check alpha 1 antitrypsin levels and genotype.  Sleep apnea She's just started on a CPAP and is tolerating the therapy well.  Plan: - Full set of PFTs with bronchodilator response - Alpha-1 antitrypsin testing - Continue albuterol - Start Flonase.  Marshell Garfinkel MD [facility] Pager [phone] If no answer or after 3pm call: [phone] 07/27/2015, 5:11 PM

## 2015-08-02 ENCOUNTER — Ambulatory Visit (INDEPENDENT_AMBULATORY_CARE_PROVIDER_SITE_OTHER): Payer: 59 | Admitting: Pulmonary Disease

## 2015-08-02 DIAGNOSIS — R0600 Dyspnea, unspecified: Secondary | ICD-10-CM | POA: Diagnosis not present

## 2015-08-02 LAB — PULMONARY FUNCTION TEST
DL/VA % pred: 83 %
DL/VA: 4.3 ml/min/mmHg/L
DLCO UNC % PRED: 66 %
DLCO unc: 18.84 ml/min/mmHg
FEF 25-75 POST: 2.18 L/s
FEF 25-75 PRE: 1.85 L/s
FEF2575-%CHANGE-POST: 17 %
FEF2575-%PRED-POST: 85 %
FEF2575-%PRED-PRE: 72 %
FEV1-%Change-Post: 6 %
FEV1-%PRED-POST: 84 %
FEV1-%Pred-Pre: 79 %
FEV1-Post: 2.42 L
FEV1-Pre: 2.28 L
FEV1FVC-%CHANGE-POST: 2 %
FEV1FVC-%Pred-Pre: 99 %
FEV6-%CHANGE-POST: 3 %
FEV6-%Pred-Post: 84 %
FEV6-%Pred-Pre: 82 %
FEV6-PRE: 2.93 L
FEV6-Post: 3.02 L
FEV6FVC-%Change-Post: 0 %
FEV6FVC-%Pred-Post: 104 %
FEV6FVC-%Pred-Pre: 104 %
FVC-%CHANGE-POST: 3 %
FVC-%PRED-POST: 81 %
FVC-%Pred-Pre: 79 %
FVC-Post: 3.02 L
FVC-Pre: 2.93 L
POST FEV1/FVC RATIO: 80 %
PRE FEV1/FVC RATIO: 78 %
Post FEV6/FVC ratio: 100 %
Pre FEV6/FVC Ratio: 100 %
RV % pred: 103 %
RV: 2.2 L
TLC % pred: 97 %
TLC: 5.34 L

## 2015-08-02 NOTE — Progress Notes (Signed)
PFT done today. 

## 2015-08-03 LAB — ALPHA-1 ANTITRYPSIN PHENOTYPE: A1 ANTITRYPSIN: 164 mg/dL (ref 83–199)

## 2015-08-08 ENCOUNTER — Telehealth: Payer: Self-pay | Admitting: Pulmonary Disease

## 2015-08-08 NOTE — Telephone Encounter (Signed)
Pt is requesting results of PFT that was performed on 08/02/15.  PM - please advise.  Thanks.

## 2015-08-11 NOTE — Telephone Encounter (Signed)
lmomtcb x1 

## 2015-08-11 NOTE — Telephone Encounter (Signed)
Please let her know that PFTs show very mild obstruction consistent with asthma not COPD. Her blood tests do not show any evidence of alpha1 antitrypsin abnormalities.

## 2015-08-11 NOTE — Telephone Encounter (Signed)
lmtcb for pt.  

## 2015-08-11 NOTE — Telephone Encounter (Signed)
Dr. Mannam - please advise. Thanks. 

## 2015-08-11 NOTE — Telephone Encounter (Signed)
[phone], pt cb

## 2015-08-14 NOTE — Telephone Encounter (Signed)
Try mucinex and robitussin DM.

## 2015-08-14 NOTE — Telephone Encounter (Signed)
Patient notified of test results.   Patient wants to know what she needs to do about her cough.  She said that her cough sounds like she has "emphysema".  She is not coughing anything up, just a very raspy, harsh cough.    Dr. Vaughan Browner, please advise.

## 2015-08-14 NOTE — Telephone Encounter (Signed)
Called and spoke with pt  Pt stated that she has tried mucinex DM in past and it has not helped with cough Pt states that cough is chronic and she does not feel comfortable taking mucinex or robitussin every day for the rest of her life Pt would like further rec from PM of what can be done Informed pt that i would send more detailed message to PM for further rec Pt aware that it may be tomorrow before receiving additional rec Pt request that we call work # tomorrow instead of cell #  Dr Vaughan Browner, please advise

## 2015-08-16 NOTE — Telephone Encounter (Signed)
[phone], pt cb will be at this number until 4pm

## 2015-08-17 ENCOUNTER — Encounter: Payer: Self-pay | Admitting: Pulmonary Disease

## 2015-08-17 MED ORDER — BECLOMETHASONE DIPROPIONATE 80 MCG/ACT IN AERS: 1.0000 | INHALATION_SPRAY | Freq: Two times a day (BID) | RESPIRATORY_TRACT | Status: DC

## 2015-08-17 NOTE — Telephone Encounter (Signed)
Noted, thank you. Pt only has one pharmacy on file.  This rx has been sent.  Nothing further needed.

## 2015-08-17 NOTE — Telephone Encounter (Signed)
Qvar 80 mcg 1 puff twice daily. Rinse mouth after each use

## 2015-08-17 NOTE — Telephone Encounter (Signed)
I called and spoke with her. She is still having persistent cough. I will increase oneprezole to 20 mg twice daily. Start qvar inhaler (please order). She will continue the flonase, zyrtec for post nasal drip.

## 2015-08-17 NOTE — Telephone Encounter (Signed)
Spoke with pt. Advised her that we still have not heard back from Dr. Vaughan Browner. States that this is not an emergent issue. Message will be sent back to [facility] to address.  Dr. Vaughan Browner - please advise. Thanks.

## 2015-08-17 NOTE — Telephone Encounter (Signed)
lmtcb X1 for pt to verify which pharmacy this needs to be sent to.   PM please advise on qvar directions.  Thanks!

## 2015-08-17 NOTE — Telephone Encounter (Signed)
lmtcb for pt.  

## 2015-08-17 NOTE — Telephone Encounter (Signed)
[phone] calling back

## 2015-09-05 ENCOUNTER — Other Ambulatory Visit: Payer: Self-pay

## 2015-09-07 ENCOUNTER — Telehealth: Payer: Self-pay

## 2015-09-07 NOTE — Patient Outreach (Signed)
[place] [facility]) Care Management  09/07/2015  Heather Stout 17-Nov-1955 [id number]   Member did not show for scheduled 09/05/15 Link to Wellness appointment.  Missed appointment letter sent. Peter Garter RN, [facility] Care Management Coordinator-Link to [place] Management [phone]

## 2015-09-11 MED FILL — MICROLET LANCETS: 90 days supply | Qty: 100 | Fill #2

## 2015-09-11 MED FILL — CONTOUR TEST STRIPS: 90 days supply | Qty: 100 | Fill #2

## 2015-09-11 MED FILL — HUMALOG 100 UNITS/ML KWIKPE: 100 | 90 days supply | Qty: 15 | Fill #1

## 2015-09-15 MED FILL — UNIFINE PENTIPS 31GX3/16: 31G X 5 MM | 90 days supply | Qty: 300 | Fill #1

## 2015-09-17 DIAGNOSIS — G4733 Obstructive sleep apnea (adult) (pediatric): Secondary | ICD-10-CM | POA: Diagnosis not present

## 2015-09-18 MED FILL — OMEPRAZOLE DR 20 MG CAPSULE: 20 | 30 days supply | Qty: 60 | Fill #1

## 2015-09-18 MED FILL — CHOLESTYRAMINE PACKET: 4 | 30 days supply | Qty: 60 | Fill #2

## 2015-09-19 MED FILL — EDARBYCLOR 40-25 MG TABLET: 40-25 | 30 days supply | Qty: 30 | Fill #6

## 2015-09-26 DIAGNOSIS — G4733 Obstructive sleep apnea (adult) (pediatric): Secondary | ICD-10-CM | POA: Diagnosis not present

## 2015-10-03 DIAGNOSIS — E119 Type 2 diabetes mellitus without complications: Secondary | ICD-10-CM | POA: Diagnosis not present

## 2015-10-05 DIAGNOSIS — E1165 Type 2 diabetes mellitus with hyperglycemia: Secondary | ICD-10-CM | POA: Diagnosis not present

## 2015-10-05 DIAGNOSIS — I10 Essential (primary) hypertension: Secondary | ICD-10-CM | POA: Diagnosis not present

## 2015-10-17 MED FILL — INVOKANA 300 MG TABLET: 300 | 90 days supply | Qty: 90 | Fill #2

## 2015-10-17 MED FILL — OMEPRAZOLE DR 20 MG CAPSULE: 20 | 30 days supply | Qty: 60 | Fill #2

## 2015-10-17 MED FILL — EDARBYCLOR 40-25 MG TABLET: 40-25 | 30 days supply | Qty: 30 | Fill #7

## 2015-10-17 MED FILL — CHOLESTYRAMINE PACKET: 4 | 30 days supply | Qty: 60 | Fill #3

## 2015-10-17 MED FILL — SOLIQUA 100 UNIT-33 MCG/ML: 100-33 | 44 days supply | Qty: 15 | Fill #0

## 2015-10-17 MED FILL — FENOFIBRATE 145 MG TABLET: 145 | 90 days supply | Qty: 90 | Fill #3

## 2015-10-25 DIAGNOSIS — H2513 Age-related nuclear cataract, bilateral: Secondary | ICD-10-CM | POA: Diagnosis not present

## 2015-10-25 DIAGNOSIS — E119 Type 2 diabetes mellitus without complications: Secondary | ICD-10-CM | POA: Diagnosis not present

## 2015-11-02 ENCOUNTER — Ambulatory Visit: Payer: Self-pay

## 2015-11-13 MED FILL — CONTOUR TEST STRIPS: 50 days supply | Qty: 100 | Fill #0

## 2015-11-16 MED FILL — ALPRAZolam 0.5 MG TABS: 0.5 | 10 days supply | Qty: 30 | Fill #0

## 2015-11-20 MED FILL — OMEPRAZOLE DR 20 MG CAPSULE: 20 | 30 days supply | Qty: 60 | Fill #0

## 2015-11-21 MED FILL — SOLIQUA 100 UNIT-33 MCG/ML: 100-33 | 35 days supply | Qty: 15 | Fill #0

## 2015-12-04 MED FILL — MICROLET LANCETS: 90 days supply | Qty: 100 | Fill #3

## 2015-12-04 MED FILL — EDARBYCLOR 40-25 MG TABLET: 40-25 | 30 days supply | Qty: 30 | Fill #8

## 2015-12-21 ENCOUNTER — Other Ambulatory Visit: Payer: Self-pay

## 2015-12-21 VITALS — BP 126/72 | HR 70 | Resp 16 | Ht 67.0 in | Wt 184.0 lb

## 2015-12-21 DIAGNOSIS — E119 Type 2 diabetes mellitus without complications: Secondary | ICD-10-CM

## 2015-12-21 DIAGNOSIS — Z794 Long term (current) use of insulin: Principal | ICD-10-CM

## 2015-12-21 NOTE — Patient Outreach (Signed)
[place] [facility]) Care Management   12/21/2015  NYHLA SALTER 1956/02/02 [id number]  Heather Stout is an 60 y.o. female.   Member seen for follow up office visit for Link to Wellness program for self management of Type 2 diabetes  Subjective:  Member states that she has been under a lot of stress for the last 2 months as her brother was critically ill in ICU and almost died.  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  States he is doing better now.  States she did have an episode when she has been at the hospital with him that her blood sugar dropped to 39.  States that she had not eaten.  States her hemoglobin A1C was down to 6.2 when she had it checked in February.  States she is now on [place] which she thinks is better for her as she is taking less shots.  States her blood sugars have been good most of the time.  States she has joined [employer] and she has lost about 10 lbs since the first of the year.  States she is not exercising regularly other than walking with her work.      Objective:   Review of Systems  All other systems reviewed and are negative.   Physical Exam Today's Vitals   12/21/15 1605  BP: 126/72  Pulse: 70  Resp: 16  Height: 1.702 m (5\' 7" )  Weight: 184 lb (83.462 kg)  SpO2: 97%  PainSc: 0-No pain   Encounter Medications:   Outpatient Encounter Prescriptions as of 12/21/2015  Medication Sig  . albuterol (PROVENTIL HFA;VENTOLIN HFA) 108 (90 BASE) MCG/ACT inhaler Inhale into the lungs every 6 (six) hours as needed for wheezing or shortness of breath.  Marland Kitchen aspirin 81 MG tablet Take 81 mg by mouth daily.  . Azilsartan-Chlorthalidone (EDARBYCLOR) 40-12.5 MG TABS Take by mouth.  . Calcium Carbonate-Vitamin D (CALCIUM 500 + D) 500-125 MG-UNIT TABS Take by mouth.  . canagliflozin (INVOKANA) 300 MG TABS tablet Take 300 mg by mouth daily before breakfast.  . cetirizine (ZYRTEC) 10 MG tablet Take 10 mg by mouth daily.  . cholecalciferol (VITAMIN D) 1000  UNITS tablet Take 2,000 Units by mouth daily.   . cholestyramine (QUESTRAN) 4 GM/DOSE powder Take by mouth daily.  . Cyanocobalamin (VITAMIN B 12 PO) Take by mouth.  . fenofibrate (TRICOR) 145 MG tablet Take 145 mg by mouth daily.  . Insulin Glargine-Lixisenatide (SOLIQUA) 100-33 UNT-MCG/ML SOPN Inject 44 Units into the skin daily.  . insulin lispro (HUMALOG KWIKPEN) 100 UNIT/ML KiwkPen Inject 8 Units into the skin daily before supper.  . Magnesium 250 MG TABS Take 1 tablet by mouth.  Marland Kitchen omeprazole (PRILOSEC) 20 MG capsule Take 20 mg by mouth 2 (two) times daily before a meal.   . Probiotic Product (PROBIOTIC PO) Take 1 capsule by mouth.  . beclomethasone (QVAR) 80 MCG/ACT inhaler Inhale 1 puff into the lungs 2 (two) times daily. (Patient not taking: Reported on 12/21/2015)  . insulin glargine (LANTUS) 100 UNIT/ML injection Inject 56 Units into the skin daily. Reported on 12/22/2015  . Liraglutide 18 MG/3ML SOPN Inject 18 mg into the skin. Reported on 12/22/2015   No facility-administered encounter medications on file as of 12/21/2015.    Functional Status:   In your present state of health, do you have any difficulty performing the following activities: 04/04/2015  Hearing? N  Vision? N  Difficulty concentrating or making decisions? N  Walking or climbing stairs? N  Dressing or bathing? N  Doing errands, shopping? N    Fall/Depression Screening:    PHQ 2/9 Scores 12/21/2015 04/04/2015 11/29/2014 10/29/2014  PHQ - 2 Score 0 0 0 0    Assessment:  Member seen for follow up office visit for Link to Wellness program for self management of Type 2 diabetes. Member has decreased hemoglobin A1C to 6.2 and is meeting her goal of hemoglobin A1C of 6.5% or below. She is working with PharmD at [place] office to adjust her medications to lower her CBGs. She reports she is following a low CHO diet and has joined [employer]. She has lost 7 lbs since last Link to Wellness visit She has not been exercising  regularly. Member up to date with eye and dental exams.  Plan:  Plan to eat 30-45 GM (2-3) servings of carbohydrate a meal and 15 GM for snacks. Plan to eat protein with snacks Plan to check blood sugar twice a day fasting or 1 -2hrs after a meal.  Goals of 80-110 fasting and less than 140 after eating. Plan to walk 3 times a week for 20 minutes and do yoga once a week Plan to see pharmacist at Dr. Pennie Banter office in May Plan to see Norm Parcel to Wellness March 21, 2016 at [place] Problem One        Most Recent Value   Care Plan Problem One  Potential for elevated blood sugars   Role Documenting the Problem One  Care Management [place] for Problem One  Active   [facility] Long Term Goal (31-90 days)  Member will maintain hemoglobin A1C of 7.0 or lower for the next 90 days   [facility] Long Term Goal Start Date  12/21/15 [continued Hemoglobin A1C 6. on 10/03/15]   Interventions for Problem One Long Term Goal  Reviewed carbohydrate counting portion sizes. Instructed on how stress can effect her blood sugars, Instructed on sick day management and given handout on sick days, Reinforced s/s of hypoglycemia and the rule of 15, Given handout on hypoglycemia, encouraged to carry glucose tablets, Encouraged to get a medical alert identification,  Reinforced on the importance of regular exercise to control blood sugars,Instructed to keep appointment with PharmD at [place] office in May     Melissa Sandlin RN, [facility] Care Management Coordinator-Link to [place] Management [phone]

## 2015-12-21 NOTE — Patient Instructions (Signed)
1. Plan to eat 30-45 GM (2-3) servings of carbohydrate a meal and 15 GM for snacks. Plan to eat protein with snacks 2. Plan to check blood sugar twice a day fasting or 1 -2hrs after a meal.  Goals of 80-110 fasting and less than 140 after eating. 3. Plan to walk 3 times a week for 20 minutes and do yoga once a week 4. Plan to see pharmacist at Dr. Pennie Banter office in May 5. Plan to see Link to Wellness March 21, 2016 at [facility]

## 2015-12-27 MED FILL — OMEPRAZOLE DR 20 MG CAPSULE: 20 | 30 days supply | Qty: 60 | Fill #1

## 2015-12-27 MED FILL — EDARBYCLOR 40-25 MG TABLET: 40-25 | 30 days supply | Qty: 30 | Fill #9

## 2015-12-27 MED FILL — SOLIQUA 100 UNIT-33 MCG/ML: 100-33 | 35 days supply | Qty: 15 | Fill #1

## 2015-12-28 DIAGNOSIS — E1165 Type 2 diabetes mellitus with hyperglycemia: Secondary | ICD-10-CM | POA: Diagnosis not present

## 2015-12-28 MED FILL — CONTOUR NEXT METER: W/DEVICE | 30 days supply | Qty: 1 | Fill #0

## 2015-12-28 MED FILL — CONTOUR NEXT STRIPS: 90 days supply | Qty: 200 | Fill #0

## 2016-01-02 DIAGNOSIS — E785 Hyperlipidemia, unspecified: Secondary | ICD-10-CM | POA: Diagnosis not present

## 2016-01-02 DIAGNOSIS — G4733 Obstructive sleep apnea (adult) (pediatric): Secondary | ICD-10-CM | POA: Diagnosis not present

## 2016-01-02 DIAGNOSIS — E119 Type 2 diabetes mellitus without complications: Secondary | ICD-10-CM | POA: Diagnosis not present

## 2016-01-25 MED FILL — SOLIQUA 100 UNIT-33 MCG/ML: 100-33 | 44 days supply | Qty: 15 | Fill #1

## 2016-01-25 MED FILL — EDARBYCLOR 40-25 MG TABLET: 40-25 | 30 days supply | Qty: 30 | Fill #10

## 2016-01-25 MED FILL — FENOFIBRATE 145 MG TABLET: 145 | 90 days supply | Qty: 90 | Fill #0

## 2016-01-25 MED FILL — CHOLESTYRAMINE PACKET: 4 | 30 days supply | Qty: 60 | Fill #4

## 2016-01-25 MED FILL — OMEPRAZOLE DR 20 MG CAPSULE: 20 | 30 days supply | Qty: 60 | Fill #2

## 2016-02-28 MED FILL — UNIFINE PENTIPS 31GX3/16: 31G X 5 MM | 90 days supply | Qty: 300 | Fill #2

## 2016-02-28 MED FILL — EDARBYCLOR 40-25 MG TABLET: 40-25 | 30 days supply | Qty: 30 | Fill #0

## 2016-02-28 MED FILL — HUMALOG 100 UNITS/ML KWIKPE: 100 | 60 days supply | Qty: 6 | Fill #0

## 2016-02-28 MED FILL — OMEPRAZOLE DR 20 MG CAPSULE: 20 | 30 days supply | Qty: 60 | Fill #0

## 2016-03-01 MED FILL — SOLIQUA 100 UNIT-33 MCG/ML: 100-33 | 44 days supply | Qty: 15 | Fill #2

## 2016-03-21 ENCOUNTER — Ambulatory Visit: Payer: 59

## 2016-03-25 DIAGNOSIS — Z01419 Encounter for gynecological examination (general) (routine) without abnormal findings: Secondary | ICD-10-CM | POA: Diagnosis not present

## 2016-03-25 DIAGNOSIS — Z124 Encounter for screening for malignant neoplasm of cervix: Secondary | ICD-10-CM | POA: Diagnosis not present

## 2016-03-25 DIAGNOSIS — Z1231 Encounter for screening mammogram for malignant neoplasm of breast: Secondary | ICD-10-CM | POA: Diagnosis not present

## 2016-03-25 DIAGNOSIS — Z6828 Body mass index (BMI) 28.0-28.9, adult: Secondary | ICD-10-CM | POA: Diagnosis not present

## 2016-03-25 MED FILL — ESCITALOPRAM 10 MG TABLET: 10 | 30 days supply | Qty: 30 | Fill #0

## 2016-03-26 MED FILL — EDARBYCLOR 40-25 MG TABLET: 40-25 | 30 days supply | Qty: 30 | Fill #1

## 2016-03-26 MED FILL — OMEPRAZOLE DR 20 MG CAPSULE: 20 | 30 days supply | Qty: 60 | Fill #1

## 2016-03-26 MED FILL — CHOLESTYRAMINE PACKET: 4 | 30 days supply | Qty: 60 | Fill #5

## 2016-04-01 DIAGNOSIS — E119 Type 2 diabetes mellitus without complications: Secondary | ICD-10-CM | POA: Diagnosis not present

## 2016-04-11 MED FILL — SOLIQUA 100 UNIT-33 MCG/ML: 100-33 | 44 days supply | Qty: 15 | Fill #3

## 2016-04-16 DIAGNOSIS — E119 Type 2 diabetes mellitus without complications: Secondary | ICD-10-CM | POA: Diagnosis not present

## 2016-04-25 MED FILL — OMEPRAZOLE DR 20 MG CAPSULE: 20 | 30 days supply | Qty: 60 | Fill #2

## 2016-04-30 MED FILL — EDARBYCLOR 40-25 MG TABLET: 40-25 | 30 days supply | Qty: 30 | Fill #2

## 2016-04-30 MED FILL — FENOFIBRATE 145 MG TABLET: 145 | 90 days supply | Qty: 90 | Fill #1

## 2016-05-01 MED FILL — INVOKANA 300 MG TABLET: 300 | 90 days supply | Qty: 90 | Fill #0

## 2016-05-02 ENCOUNTER — Other Ambulatory Visit: Payer: Self-pay

## 2016-05-02 DIAGNOSIS — Z794 Long term (current) use of insulin: Principal | ICD-10-CM

## 2016-05-02 DIAGNOSIS — E119 Type 2 diabetes mellitus without complications: Secondary | ICD-10-CM

## 2016-05-02 NOTE — Patient Instructions (Signed)
1. Plan to eat 30-45 GM (2-3) servings of carbohydrate a meal and 15 GM for snacks. Plan to eat protein with snacks 2. Plan to check blood sugar twice a day fasting or 1 -2hrs after a meal.  Goals of 80-110 fasting and less than 140 after eating. 3. Plan to join [facility] and go 2 times a week for 30 minutes 4. Plan to see pharmacist at Dr. Pennie Banter office on 07/22/16 5. Plan to see Link to Wellness 07/29/16 at [facility]

## 2016-05-02 NOTE — Patient Outreach (Signed)
Heather Stout [facility]) Care Management   05/02/2016  Heather Stout [birth year] [id number]  Heather Stout is an 60 y.o. female.   Member seen for follow up office visit for Link to Wellness program for self management of Type 2 diabetes  Subjective: Member states that she saw the pharmacist at her MD's office in August and her hemoglobin A1C was 6.6%.  States that she continues to have a lot of stress as her Mother was sick and in the hospital.  States she has been her caregiver.  States that she is also preparing her office to go on a new medical record system.  States she is trying to eat healthy but she sometimes eats too large a portion.  States she is not exercising regularly but she would like to join the [employer].  States her blood sugars are ranging from 90-150 in the AM.    Objective:   Review of Systems  Musculoskeletal: Positive for joint pain.    Physical Exam Today's Vitals   05/02/16 1605 05/02/16 1609  BP: 128/74   Pulse: 72   Resp: 14   SpO2: 96%   Weight: 185 lb 3.2 oz (84 kg)   Height: 1.702 m (5\' 7" )   PainSc: 0-No pain 0-No pain   Encounter Medications:   Outpatient Encounter Prescriptions as of 05/02/2016  Medication Sig  . albuterol (PROVENTIL HFA;VENTOLIN HFA) 108 (90 BASE) MCG/ACT inhaler Inhale into the lungs every 6 (six) hours as needed for wheezing or shortness of breath.  Marland Kitchen aspirin 81 MG tablet Take 81 mg by mouth daily.  . Azilsartan-Chlorthalidone (EDARBYCLOR) 40-12.5 MG TABS Take by mouth.  . Calcium Carbonate-Vitamin D (CALCIUM 500 + D) 500-125 MG-UNIT TABS Take by mouth.  . canagliflozin (INVOKANA) 300 MG TABS tablet Take 300 mg by mouth daily before breakfast.  . cetirizine (ZYRTEC) 10 MG tablet Take 10 mg by mouth daily.  . cholecalciferol (VITAMIN D) 1000 UNITS tablet Take 2,000 Units by mouth daily.   . cholestyramine (QUESTRAN) 4 GM/DOSE powder Take by mouth daily.  . Cyanocobalamin (VITAMIN B 12 PO) Take by mouth.  . fenofibrate  (TRICOR) 145 MG tablet Take 145 mg by mouth daily.  . Insulin Glargine-Lixisenatide (SOLIQUA) 100-33 UNT-MCG/ML SOPN Inject 44 Units into the skin daily.  . insulin lispro (HUMALOG KWIKPEN) 100 UNIT/ML KiwkPen Inject 8 Units into the skin daily before supper.  . Magnesium 250 MG TABS Take 1 tablet by mouth.  Marland Kitchen omeprazole (PRILOSEC) 20 MG capsule Take 20 mg by mouth 2 (two) times daily before a meal.   . Probiotic Product (PROBIOTIC PO) Take 1 capsule by mouth.  . beclomethasone (QVAR) 80 MCG/ACT inhaler Inhale 1 puff into the lungs 2 (two) times daily. (Patient not taking: Reported on 12/21/2015)  . insulin glargine (LANTUS) 100 UNIT/ML injection Inject 56 Units into the skin daily. Reported on 12/22/2015  . Liraglutide 18 MG/3ML SOPN Inject 18 mg into the skin. Reported on 12/22/2015   No facility-administered encounter medications on file as of 05/02/2016.     Functional Status:   In your present state of health, do you have any difficulty performing the following activities: 05/02/2016  Hearing? N  Vision? N  Difficulty concentrating or making decisions? N  Walking or climbing stairs? N  Dressing or bathing? N  Doing errands, shopping? N  Some recent data might be hidden    Fall/Depression Screening:    PHQ 2/9 Scores 05/02/2016 12/21/2015 04/04/2015 11/29/2014 10/29/2014  PHQ - 2 Score  0 0 0 0 0    Assessment:  Member seen for follow up office visit for Link to Wellness program for self management of Type 2 diabetes. Member has increased  hemoglobin A1C to 6.6% and is slightly above her goal of hemoglobin A1C of 6.5% or below. She is working with PharmD at MD office to adjust her medications to lower her CBGs. She reports she is following a low CHO diet. She has not been exercising regularly. Member up to date with eye and dental exams.  Plan:   Plan to eat 30-45 GM (2-3) servings of carbohydrate a meal and 15 GM for snacks. Plan to eat protein with snacks Plan to check blood sugar twice a  day fasting or 1 -2hrs after a meal.  Goals of 80-110 fasting and less than 140 after eating. Plan to join [facility] and go 2 times a week for 30 minutes Plan to see pharmacist at Dr. Pennie Banter office on 07/22/16 Plan to see Link to Wellness 07/29/16 at [place] Problem One   Flowsheet Row Most Recent Value  Care Plan Problem One  Potential for elevated blood sugars  Role Documenting the Problem One  Care Management [place] for Problem One  Active  THN Long Term Goal (31-90 days)  Member will maintain hemoglobin A1C of 7.0 or lower for the next 90 days  THN Long Term Goal Start Date  05/02/16 [continued Hemoglobin A1C 6.6%]  Interventions for Problem One Long Term Goal  Reviewed carbohydrate counting portion sizes. Reinforced on how stress can effect her blood sugars,  Reinforced s/s of hypoglycemia and the rule of 15,  Encouraged to get a medical alert identification, Encouraged to join the [facility] and restart Weight Watchers,  Reinforced on the importance of regular exercise to control blood sugars,Instructed to keep appointment with PharmD at [place] office in November     Pat Elicker RN, [facility] Care Management Coordinator-Link to [place] Management [phone]

## 2016-05-07 DIAGNOSIS — R197 Diarrhea, unspecified: Secondary | ICD-10-CM | POA: Diagnosis not present

## 2016-05-07 DIAGNOSIS — R140 Abdominal distension (gaseous): Secondary | ICD-10-CM | POA: Diagnosis not present

## 2016-05-07 DIAGNOSIS — K219 Gastro-esophageal reflux disease without esophagitis: Secondary | ICD-10-CM | POA: Diagnosis not present

## 2016-05-07 DIAGNOSIS — R152 Fecal urgency: Secondary | ICD-10-CM | POA: Diagnosis not present

## 2016-05-17 MED FILL — HUMALOG 100 UNITS/ML KWIKPE: 100 | 60 days supply | Qty: 6 | Fill #1

## 2016-05-17 MED FILL — SOLIQUA 100 UNIT-33 MCG/ML: 100-33 | 44 days supply | Qty: 15 | Fill #4

## 2016-05-28 MED FILL — CHOLESTYRAMINE PACKET: 4 | 30 days supply | Qty: 60 | Fill #0

## 2016-05-28 MED FILL — OMEPRAZOLE DR 20 MG CAPSULE: 20 | 90 days supply | Qty: 180 | Fill #0

## 2016-05-28 MED FILL — MICROLET LANCETS: 90 days supply | Qty: 100 | Fill #0

## 2016-06-13 MED FILL — EDARBYCLOR 40-25 MG TABLET: 40-25 | 30 days supply | Qty: 30 | Fill #3

## 2016-06-19 MED FILL — SOLIQUA 100 UNIT-33 MCG/ML: 100-33 | 44 days supply | Qty: 15 | Fill #5

## 2016-07-10 MED FILL — EDARBYCLOR 40-25 MG TABLET: 40-25 | 30 days supply | Qty: 30 | Fill #4

## 2016-07-17 DIAGNOSIS — E119 Type 2 diabetes mellitus without complications: Secondary | ICD-10-CM | POA: Diagnosis not present

## 2016-07-22 DIAGNOSIS — E119 Type 2 diabetes mellitus without complications: Secondary | ICD-10-CM | POA: Diagnosis not present

## 2016-07-23 MED FILL — HUMALOG 100 UNITS/ML KWIKPE: 100 | 90 days supply | Qty: 6 | Fill #2

## 2016-07-23 MED FILL — FENOFIBRATE 145 MG TABLET: 145 | 90 days supply | Qty: 90 | Fill #0

## 2016-07-23 MED FILL — CHOLESTYRAMINE PACKET: 4 | 30 days supply | Qty: 60 | Fill #1

## 2016-07-23 MED FILL — SOLIQUA 100 UNIT-33 MCG/ML: 100-33 | 44 days supply | Qty: 15 | Fill #6

## 2016-07-23 MED FILL — FARXIGA 10 MG TABLET: 10 | 90 days supply | Qty: 90 | Fill #0

## 2016-07-29 ENCOUNTER — Other Ambulatory Visit: Payer: Self-pay

## 2016-07-29 DIAGNOSIS — Z794 Long term (current) use of insulin: Principal | ICD-10-CM

## 2016-07-29 DIAGNOSIS — E119 Type 2 diabetes mellitus without complications: Secondary | ICD-10-CM

## 2016-07-29 NOTE — Patient Instructions (Signed)
1. Plan to eat 30-45 GM (2-3) servings of carbohydrate a meal and 15 GM for snacks. Plan to eat protein with snacks 2. Plan to check blood sugar 1-2 times a day fasting or 1 -2hrs after a meal.  Goals of 80-110 fasting and less than 140 after eating. 3. Plan to try go to Zumba class on Wednesdays at 5:30PM at [facility]  4. Plan to see pharmacist at Dr. Pennie Banter office on 10/24/16 5. Plan to follow with the [facility] program

## 2016-07-29 NOTE — Patient Outreach (Signed)
[facility]) Care Management   07/29/2016  Heather Stout 08-01-1956 [id number]  Heather Stout is an 60 y.o. female.   Member seen for follow up office visit for Link to Wellness program for self management of Type 2 diabetes  Subjective: Member states that she is at a new job now and she is having less stress now.  States she likes her new position.  States she saw the Pharmacist at Dr. Pennie Banter office on 11/27 and her hemoglobin A1C was 6.8%.  States that they changed her Invokana to [place] due to its potential risk for amputations.  States she is trying to watch her diet during the holidays.  States she has not been exercising regularly but she has been walking a lot doing Christmas shopping. States her blood sugars have been good and she has not had any low readings.  Objective:   Review of Systems  All other systems reviewed and are negative.   Physical Exam Today's Vitals   07/29/16 1609 07/29/16 1616  BP: 132/60   Pulse: 74   Resp: 16   SpO2: 96%   Weight: 185 lb 12.8 oz (84.3 kg)   Height: 1.702 m (5\' 7" )   PainSc: 0-No pain 0-No pain   Encounter Medications:   Outpatient Encounter Prescriptions as of 07/29/2016  Medication Sig  . albuterol (PROVENTIL HFA;VENTOLIN HFA) 108 (90 BASE) MCG/ACT inhaler Inhale into the lungs every 6 (six) hours as needed for wheezing or shortness of breath.  Marland Kitchen aspirin 81 MG tablet Take 81 mg by mouth daily.  . Azilsartan-Chlorthalidone (EDARBYCLOR) 40-12.5 MG TABS Take by mouth.  . Calcium Carbonate-Vitamin D (CALCIUM 500 + D) 500-125 MG-UNIT TABS Take by mouth.  . cetirizine (ZYRTEC) 10 MG tablet Take 10 mg by mouth daily.  . cholecalciferol (VITAMIN D) 1000 UNITS tablet Take 2,000 Units by mouth daily.   . cholestyramine (QUESTRAN) 4 GM/DOSE powder Take by mouth daily.  . Cyanocobalamin (VITAMIN B 12 PO) Take by mouth.  . dapagliflozin propanediol (FARXIGA) 10 MG TABS tablet Take 10 mg by mouth daily.  . fenofibrate  (TRICOR) 145 MG tablet Take 145 mg by mouth daily.  . Insulin Glargine-Lixisenatide (SOLIQUA) 100-33 UNT-MCG/ML SOPN Inject 44 Units into the skin daily.  . insulin lispro (HUMALOG KWIKPEN) 100 UNIT/ML KiwkPen Inject 8 Units into the skin daily before supper.  . Liraglutide 18 MG/3ML SOPN Inject 18 mg into the skin. Reported on 12/22/2015  . Magnesium 250 MG TABS Take 1 tablet by mouth.  Marland Kitchen omeprazole (PRILOSEC) 20 MG capsule Take 20 mg by mouth 2 (two) times daily before a meal.   . Probiotic Product (PROBIOTIC PO) Take 1 capsule by mouth.  . beclomethasone (QVAR) 80 MCG/ACT inhaler Inhale 1 puff into the lungs 2 (two) times daily. (Patient not taking: Reported on 07/29/2016)  . canagliflozin (INVOKANA) 300 MG TABS tablet Take 300 mg by mouth daily before breakfast.  . insulin glargine (LANTUS) 100 UNIT/ML injection Inject 56 Units into the skin daily. Reported on 12/22/2015   No facility-administered encounter medications on file as of 07/29/2016.     Functional Status:   In your present state of health, do you have any difficulty performing the following activities: 07/29/2016 05/02/2016  Hearing? N N  Vision? N N  Difficulty concentrating or making decisions? N N  Walking or climbing stairs? N N  Dressing or bathing? N N  Doing errands, shopping? N N  Some recent data might be hidden  Fall/Depression Screening:    PHQ 2/9 Scores 07/29/2016 05/02/2016 12/21/2015 04/04/2015 11/29/2014 10/29/2014  PHQ - 2 Score 0 0 0 0 0 0    Assessment:  Member seen for follow up office visit for Link to Wellness program for self management of Type 2 diabetes. Member had  hemoglobin A1C of 6.8% and is slightly above her goal of hemoglobin A1C of 6.5% or below. She is working with PharmD at MD office to adjust her medications to lower her CBGs. She reports she is following a low CHO diet. She has not been exercising regularly. Member up to date with eye and dental exams.  Member has enrolled in the [place]  program.  Plan:  1. Plan to eat 30-45 GM (2-3) servings of carbohydrate a meal and 15 GM for snacks. Plan to eat protein with snacks 2. Plan to check blood sugar 1-2 times a day fasting or 1 -2hrs after a meal.  Goals of 80-110 fasting and less than 140 after eating. 3. Plan to try go to Zumba class on Wednesdays at 5:30PM at [facility]  4. Plan to see pharmacist at Dr. Pennie Banter office on 10/24/16 5. Plan to follow with the [place] program  [facility] CM Care Plan Problem One   Flowsheet Row Most Recent Value  Care Plan Problem One  Potential for elevated blood sugars  Role Documenting the Problem One  Care Management Coordinator  Care Plan for Problem One  Active  THN Long Term Goal (31-90 days)  Member will maintain hemoglobin A1C of 7.0 or lower for the next 90 days  THN Long Term Goal Start Date  07/29/16 [continued Hemoglobin A1C 6.8%]  Interventions for Problem One Long Term Goal  Reviewed carbohydrate counting portion sizes.  Reinforced s/s of hypoglycemia and the rule of 15,  Reinforced to get a medical alert identification, Encouraged to join the [facility] and to consider going to Zumba classes that [facility] provides, Given exercise schedule for [facility],   Reinforced on the importance of regular exercise to control blood sugars,Instructed to keep appointment with PharmD at [place] office in March, Given handout on the [facility] program and instructed that she will be followed by the [place] program in the new year     Peter Garter RN, [facility] Care Management Coordinator-Link to [place] Management [phone]

## 2016-08-01 ENCOUNTER — Encounter (INDEPENDENT_AMBULATORY_CARE_PROVIDER_SITE_OTHER): Payer: Self-pay | Admitting: Family Medicine

## 2016-08-05 MED FILL — UNIFINE PENTIPS 31GX3/16: 31G X 5 MM | 90 days supply | Qty: 300 | Fill #0

## 2016-08-05 MED FILL — UNIFINE PENTIPS 31GX3/16": 31G X 5 MM | 90 days supply | Qty: 300 | Fill #0

## 2016-08-13 MED FILL — EDARBYCLOR 40-25 MG TABLET: 40-25 | 30 days supply | Qty: 30 | Fill #5

## 2016-08-22 ENCOUNTER — Ambulatory Visit (INDEPENDENT_AMBULATORY_CARE_PROVIDER_SITE_OTHER): Payer: Self-pay | Admitting: Family Medicine

## 2016-08-23 MED FILL — OMEPRAZOLE DR 20 MG CAPSULE: 20 | 90 days supply | Qty: 180 | Fill #0

## 2016-08-27 MED FILL — SOLIQUA 100 UNIT-33 MCG/ML: 100-33 | 44 days supply | Qty: 15 | Fill #7

## 2016-08-27 MED FILL — CHOLESTYRAMINE PACKET: 4 | 30 days supply | Qty: 60 | Fill #2

## 2016-09-13 MED FILL — EDARBYCLOR 40-25 MG TABLET: 40-25 | 30 days supply | Qty: 30 | Fill #6

## 2016-09-16 MED FILL — ACCU-CHEK FASTCLIX LANCETS: 90 days supply | Qty: 306 | Fill #0

## 2016-09-20 MED FILL — ACCU-CHEK GUIDE TEST STRIP: 30 days supply | Qty: 100 | Fill #0

## 2016-09-27 MED FILL — SOLIQUA 100 UNIT-33 MCG/ML: 100-33 | 34 days supply | Qty: 15 | Fill #0

## 2016-10-08 MED FILL — OSELTAMIVIR PHOSPHATE 75 MG: 75 | 10 days supply | Qty: 10 | Fill #0

## 2016-10-10 MED FILL — EDARBYCLOR 40-25 MG TABLET: 40-25 | 30 days supply | Qty: 30 | Fill #7

## 2016-10-21 DIAGNOSIS — E119 Type 2 diabetes mellitus without complications: Secondary | ICD-10-CM | POA: Diagnosis not present

## 2016-10-21 MED FILL — HUMALOG 100 UNITS/ML KWIKPE: 100 | 27 days supply | Qty: 3 | Fill #0

## 2016-10-24 DIAGNOSIS — E119 Type 2 diabetes mellitus without complications: Secondary | ICD-10-CM | POA: Diagnosis not present

## 2016-10-24 DIAGNOSIS — I10 Essential (primary) hypertension: Secondary | ICD-10-CM | POA: Diagnosis not present

## 2016-10-30 MED FILL — FENOFIBRATE 145 MG TABLET: 145 | 90 days supply | Qty: 90 | Fill #0

## 2016-10-30 MED FILL — SOLIQUA 100 UNIT-33 MCG/ML: 100-33 | 34 days supply | Qty: 15 | Fill #1

## 2016-11-11 MED FILL — EDARBYCLOR 40-25 MG TABLET: 40-25 | 30 days supply | Qty: 30 | Fill #8

## 2016-11-11 MED FILL — FARXIGA 10 MG TABLET: 10 | 90 days supply | Qty: 90 | Fill #1

## 2016-11-21 MED FILL — OMEPRAZOLE DR 20 MG CAPSULE: 20 | 90 days supply | Qty: 180 | Fill #1

## 2016-11-27 MED FILL — CHOLESTYRAMINE PACKET: 4 | 30 days supply | Qty: 60 | Fill #3

## 2016-11-27 MED FILL — HUMALOG 100 UNITS/ML KWIKPE: 100 | 27 days supply | Qty: 3 | Fill #1

## 2016-12-04 MED FILL — SOLIQUA 100 UNIT-33 MCG/ML: 100-33 | 34 days supply | Qty: 15 | Fill #2

## 2016-12-10 DIAGNOSIS — E119 Type 2 diabetes mellitus without complications: Secondary | ICD-10-CM | POA: Diagnosis not present

## 2016-12-12 MED FILL — EDARBYCLOR 40-25 MG TABLET: 40-25 | 30 days supply | Qty: 30 | Fill #9

## 2017-01-07 MED FILL — SOLIQUA 100 UNIT-33 MCG/ML: 100-33 | 34 days supply | Qty: 15 | Fill #3

## 2017-01-07 MED FILL — EDARBYCLOR 40-25 MG TABLET: 40-25 | 30 days supply | Qty: 30 | Fill #10

## 2017-01-07 MED FILL — HUMALOG 100 UNITS/ML KWIKPE: 100 | 27 days supply | Qty: 3 | Fill #2

## 2017-01-07 MED FILL — CHOLESTYRAMINE PACKET: 4 | 30 days supply | Qty: 60 | Fill #4

## 2017-01-21 MED FILL — FENOFIBRATE 145 MG TAB: 145 | 90 days supply | Qty: 90 | Fill #1

## 2017-01-21 MED FILL — UNIFINE PENTIPS 31GX3/16": 31G X 5 MM | 90 days supply | Qty: 300 | Fill #1

## 2017-01-21 MED FILL — UNIFINE PENTIPS 31GX3/16: 31G X 5 MM | 90 days supply | Qty: 300 | Fill #1

## 2017-01-27 DIAGNOSIS — I10 Essential (primary) hypertension: Secondary | ICD-10-CM | POA: Diagnosis not present

## 2017-01-27 DIAGNOSIS — E119 Type 2 diabetes mellitus without complications: Secondary | ICD-10-CM | POA: Diagnosis not present

## 2017-01-30 DIAGNOSIS — E119 Type 2 diabetes mellitus without complications: Secondary | ICD-10-CM | POA: Diagnosis not present

## 2017-01-30 DIAGNOSIS — E785 Hyperlipidemia, unspecified: Secondary | ICD-10-CM | POA: Diagnosis not present

## 2017-02-12 MED FILL — EDARBYCLOR 40-25 MG TABLET: 40-25 | 30 days supply | Qty: 30 | Fill #0

## 2017-02-12 MED FILL — SOLIQUA 100 UNIT-33 MCG/ML: 100-33 | 34 days supply | Qty: 15 | Fill #0

## 2017-02-18 MED FILL — FARXIGA 10 MG TABLET: 10 | 90 days supply | Qty: 90 | Fill #2

## 2017-02-20 MED FILL — OMEPRAZOLE DR 20 MG CAPSULE: 20 | 90 days supply | Qty: 180 | Fill #2

## 2017-02-21 MED FILL — ACCU-CHEK GUIDE TEST STRIP: 30 days supply | Qty: 100 | Fill #1

## 2017-03-12 MED FILL — EDARBYCLOR 40-25 MG TABLET: 40-25 | 30 days supply | Qty: 30 | Fill #1

## 2017-03-19 MED FILL — SOLIQUA 100 UNIT-33 MCG/ML: 100-33 | 34 days supply | Qty: 15 | Fill #1

## 2017-03-19 MED FILL — CHOLESTYRAMINE PACKET: 4 | 30 days supply | Qty: 60 | Fill #5

## 2017-04-10 MED FILL — HUMALOG 100 UNITS/ML KWIKPE: 100 | 27 days supply | Qty: 3 | Fill #3

## 2017-04-15 MED FILL — EDARBYCLOR 40-25 MG TABLET: 40-25 | 30 days supply | Qty: 30 | Fill #2

## 2017-04-18 DIAGNOSIS — Z01419 Encounter for gynecological examination (general) (routine) without abnormal findings: Secondary | ICD-10-CM | POA: Diagnosis not present

## 2017-04-18 DIAGNOSIS — R87610 Atypical squamous cells of undetermined significance on cytologic smear of cervix (ASC-US): Secondary | ICD-10-CM | POA: Diagnosis not present

## 2017-04-18 DIAGNOSIS — Z1231 Encounter for screening mammogram for malignant neoplasm of breast: Secondary | ICD-10-CM | POA: Diagnosis not present

## 2017-04-18 DIAGNOSIS — Z124 Encounter for screening for malignant neoplasm of cervix: Secondary | ICD-10-CM | POA: Diagnosis not present

## 2017-04-22 DIAGNOSIS — K828 Other specified diseases of gallbladder: Secondary | ICD-10-CM | POA: Diagnosis not present

## 2017-04-22 DIAGNOSIS — E669 Obesity, unspecified: Secondary | ICD-10-CM | POA: Diagnosis not present

## 2017-04-22 DIAGNOSIS — K909 Intestinal malabsorption, unspecified: Secondary | ICD-10-CM | POA: Diagnosis not present

## 2017-04-22 DIAGNOSIS — R197 Diarrhea, unspecified: Secondary | ICD-10-CM | POA: Diagnosis not present

## 2017-04-22 DIAGNOSIS — K219 Gastro-esophageal reflux disease without esophagitis: Secondary | ICD-10-CM | POA: Diagnosis not present

## 2017-04-22 MED FILL — ACCU-CHEK GUIDE TEST STRIP: 30 days supply | Qty: 100 | Fill #2

## 2017-04-22 MED FILL — UNIFINE PENTIPS 31GX3/16: 31G X 5 MM | 90 days supply | Qty: 300 | Fill #2

## 2017-04-22 MED FILL — UNIFINE PENTIPS 31GX3/16": 31G X 5 MM | 90 days supply | Qty: 300 | Fill #2

## 2017-04-22 MED FILL — SOLIQUA 100 UNIT-33 MCG/ML: 100-33 | 34 days supply | Qty: 15 | Fill #2

## 2017-05-05 DIAGNOSIS — E119 Type 2 diabetes mellitus without complications: Secondary | ICD-10-CM | POA: Diagnosis not present

## 2017-05-05 DIAGNOSIS — E785 Hyperlipidemia, unspecified: Secondary | ICD-10-CM | POA: Diagnosis not present

## 2017-05-08 DIAGNOSIS — I10 Essential (primary) hypertension: Secondary | ICD-10-CM | POA: Diagnosis not present

## 2017-05-08 DIAGNOSIS — E119 Type 2 diabetes mellitus without complications: Secondary | ICD-10-CM | POA: Diagnosis not present

## 2017-05-08 DIAGNOSIS — E785 Hyperlipidemia, unspecified: Secondary | ICD-10-CM | POA: Diagnosis not present

## 2017-05-13 MED FILL — EDARBYCLOR 40-25 MG TABLET: 40-25 | 30 days supply | Qty: 30 | Fill #3

## 2017-05-13 MED FILL — FARXIGA 10 MG TABLET: 10 | 90 days supply | Qty: 90 | Fill #3

## 2017-05-27 MED FILL — HUMALOG 100 UNITS/ML KWIKPE: 100 | 27 days supply | Qty: 3 | Fill #4

## 2017-05-27 MED FILL — OMEPRAZOLE 20 MG CAP: 20 | 90 days supply | Qty: 180 | Fill #3

## 2017-05-27 MED FILL — SOLIQUA 100 UNIT-33 MCG/ML: 100-33 | 34 days supply | Qty: 15 | Fill #3

## 2017-06-11 MED FILL — EDARBYCLOR 40-25 MG TABLET: 40-25 | 30 days supply | Qty: 30 | Fill #4

## 2017-06-11 MED FILL — CHOLESTYRAMINE PACKET: 4 | 30 days supply | Qty: 60 | Fill #0

## 2017-06-19 DIAGNOSIS — G4733 Obstructive sleep apnea (adult) (pediatric): Secondary | ICD-10-CM | POA: Diagnosis not present

## 2017-06-24 DIAGNOSIS — G4733 Obstructive sleep apnea (adult) (pediatric): Secondary | ICD-10-CM | POA: Diagnosis not present

## 2017-06-27 MED FILL — HUMALOG 100 UNITS/ML KWIKPE: 100 | 27 days supply | Qty: 3 | Fill #5

## 2017-06-27 MED FILL — SOLIQUA 100 UNIT-33 MCG/ML: 100-33 | 34 days supply | Qty: 15 | Fill #0

## 2017-07-16 MED FILL — EDARBYCLOR 40-25 MG TABLET: 40-25 | 30 days supply | Qty: 30 | Fill #5

## 2017-07-29 MED FILL — HUMALOG 100 UNITS/ML KWIKPE: 100 | 27 days supply | Qty: 3 | Fill #6

## 2017-07-29 MED FILL — SOLIQUA 100 UNIT-33 MCG/ML: 100-33 | 34 days supply | Qty: 15 | Fill #1

## 2017-07-29 MED FILL — CHOLESTYRAMINE PACKET: 4 | 30 days supply | Qty: 60 | Fill #1

## 2017-08-01 ENCOUNTER — Other Ambulatory Visit: Payer: Self-pay

## 2017-08-01 NOTE — Patient Outreach (Signed)
[facility]) Care Management  08/01/2017  Heather Stout [birth year] [id number]   Case closed in [place].  Member is being followed by the [employer] program Member enrolled in an external program Peter Garter RN, [facility] Care Management Coordinator-Link to [place] Management [phone]

## 2017-08-08 DIAGNOSIS — E119 Type 2 diabetes mellitus without complications: Secondary | ICD-10-CM | POA: Diagnosis not present

## 2017-08-11 MED FILL — EDARBYCLOR 40-25 MG TABLET: 40-25 | 30 days supply | Qty: 30 | Fill #6

## 2017-08-11 MED FILL — FENOFIBRATE 145 MG TABLET: 145 | 90 days supply | Qty: 90 | Fill #2

## 2017-08-11 MED FILL — FARXIGA 10 MG TABLET: 10 | 90 days supply | Qty: 90 | Fill #0

## 2017-08-14 DIAGNOSIS — I10 Essential (primary) hypertension: Secondary | ICD-10-CM | POA: Diagnosis not present

## 2017-08-14 DIAGNOSIS — E785 Hyperlipidemia, unspecified: Secondary | ICD-10-CM | POA: Diagnosis not present

## 2017-08-14 DIAGNOSIS — E119 Type 2 diabetes mellitus without complications: Secondary | ICD-10-CM | POA: Diagnosis not present

## 2017-08-21 MED FILL — OMEPRAZOLE 20 MG CAP: 20 | 30 days supply | Qty: 60 | Fill #0

## 2017-09-15 MED FILL — EDARBYCLOR 40-25 MG TABLET: 40-25 | 30 days supply | Qty: 30 | Fill #7

## 2017-09-15 MED FILL — SOLIQUA 100 UNIT-33 MCG/ML: 100-33 | 34 days supply | Qty: 15 | Fill #2

## 2017-09-15 MED FILL — HUMALOG 100 UNITS/ML KWIKPE: 100 | 27 days supply | Qty: 3 | Fill #7

## 2017-09-24 MED FILL — CHOLESTYRAMINE PACKET: 4 | 30 days supply | Qty: 60 | Fill #2

## 2017-09-24 MED FILL — OMEPRAZOLE 20 MG CAP: 20 | 90 days supply | Qty: 180 | Fill #1

## 2017-10-13 MED FILL — EDARBYCLOR 40-25 MG TABLET: 40-25 | 30 days supply | Qty: 30 | Fill #8

## 2017-10-15 MED FILL — SOLIQUA 100 UNIT-33 MCG/ML: 100-33 | 34 days supply | Qty: 15 | Fill #3

## 2017-10-15 MED FILL — UNIFINE PENTIPS 31GX3/16: 31G X 5 MM | 90 days supply | Qty: 300 | Fill #0

## 2017-10-15 MED FILL — UNIFINE PENTIPS 31GX3/16": 31G X 5 MM | 90 days supply | Qty: 300 | Fill #0

## 2017-10-15 MED FILL — HUMALOG 100 UNITS/ML KWIKPE: 100 | 27 days supply | Qty: 3 | Fill #8

## 2017-10-16 MED FILL — ACCU-CHEK FASTCLIX LANCETS: 90 days supply | Qty: 306 | Fill #0

## 2017-10-16 MED FILL — ACCU-CHEK GUIDE TEST STRIP: 90 days supply | Qty: 300 | Fill #0

## 2017-10-31 DIAGNOSIS — L820 Inflamed seborrheic keratosis: Secondary | ICD-10-CM | POA: Diagnosis not present

## 2017-10-31 DIAGNOSIS — L821 Other seborrheic keratosis: Secondary | ICD-10-CM | POA: Diagnosis not present

## 2017-11-07 DIAGNOSIS — Z13220 Encounter for screening for lipoid disorders: Secondary | ICD-10-CM | POA: Diagnosis not present

## 2017-11-07 DIAGNOSIS — E1165 Type 2 diabetes mellitus with hyperglycemia: Secondary | ICD-10-CM | POA: Diagnosis not present

## 2017-11-07 DIAGNOSIS — N390 Urinary tract infection, site not specified: Secondary | ICD-10-CM | POA: Diagnosis not present

## 2017-11-07 DIAGNOSIS — Z0000 Encounter for general adult medical examination without abnormal findings: Secondary | ICD-10-CM | POA: Diagnosis not present

## 2017-11-07 DIAGNOSIS — Z1321 Encounter for screening for nutritional disorder: Secondary | ICD-10-CM | POA: Diagnosis not present

## 2017-11-11 DIAGNOSIS — F40240 Claustrophobia: Secondary | ICD-10-CM | POA: Diagnosis not present

## 2017-11-11 DIAGNOSIS — E785 Hyperlipidemia, unspecified: Secondary | ICD-10-CM | POA: Diagnosis not present

## 2017-11-11 DIAGNOSIS — E1165 Type 2 diabetes mellitus with hyperglycemia: Secondary | ICD-10-CM | POA: Diagnosis not present

## 2017-11-11 DIAGNOSIS — R5381 Other malaise: Secondary | ICD-10-CM | POA: Diagnosis not present

## 2017-11-11 DIAGNOSIS — I10 Essential (primary) hypertension: Secondary | ICD-10-CM | POA: Diagnosis not present

## 2017-11-11 DIAGNOSIS — Z8619 Personal history of other infectious and parasitic diseases: Secondary | ICD-10-CM | POA: Diagnosis not present

## 2017-11-11 DIAGNOSIS — Z0000 Encounter for general adult medical examination without abnormal findings: Secondary | ICD-10-CM | POA: Diagnosis not present

## 2017-11-11 DIAGNOSIS — G4700 Insomnia, unspecified: Secondary | ICD-10-CM | POA: Diagnosis not present

## 2017-11-11 DIAGNOSIS — J45901 Unspecified asthma with (acute) exacerbation: Secondary | ICD-10-CM | POA: Diagnosis not present

## 2017-11-11 MED FILL — NITROFURANTOIN MONO-MCR 100: 100 | 5 days supply | Qty: 10 | Fill #0

## 2017-11-19 MED FILL — FENOFIBRATE 145 MG TAB: 145 | 90 days supply | Qty: 90 | Fill #0

## 2017-11-20 MED FILL — EDARBYCLOR 40-25 MG TABLET: 40-25 | 30 days supply | Qty: 30 | Fill #9

## 2017-11-25 MED FILL — SOLIQUA 100 UNIT-33 MCG/ML: 100-33 | 34 days supply | Qty: 15 | Fill #0

## 2017-11-25 MED FILL — HUMALOG 100 UNITS/ML KWIKPE: 100 | 84 days supply | Qty: 9 | Fill #0

## 2017-11-27 MED FILL — XIGDUO XR 5 MG-500 MG TAB: 5-500 | 30 days supply | Qty: 30 | Fill #0

## 2017-12-10 MED FILL — CHOLESTYRAMINE PACKET: 4 | 30 days supply | Qty: 60 | Fill #3

## 2017-12-19 MED FILL — EDARBYCLOR 40-25 MG TABLET: 40-25 | 30 days supply | Qty: 30 | Fill #10

## 2017-12-24 MED FILL — OMEPRAZOLE 20 MG CAP: 20 | 90 days supply | Qty: 180 | Fill #0

## 2017-12-24 MED FILL — SOLIQUA 100 UNIT-33 MCG/ML: 100-33 | 34 days supply | Qty: 15 | Fill #1

## 2017-12-24 MED FILL — XIGDUO XR 5 MG-500 MG TAB: 5-500 | 30 days supply | Qty: 30 | Fill #1

## 2018-01-07 DIAGNOSIS — G4733 Obstructive sleep apnea (adult) (pediatric): Secondary | ICD-10-CM | POA: Diagnosis not present

## 2018-01-21 MED FILL — EDARBYCLOR 40-25 MG TABLET: 40-25 | 30 days supply | Qty: 30 | Fill #0

## 2018-01-21 MED FILL — XIGDUO XR 5 MG-500 MG TAB: 5-500 | 30 days supply | Qty: 30 | Fill #2

## 2018-01-21 MED FILL — CHOLESTYRAMINE PACKET: 4 | 30 days supply | Qty: 60 | Fill #4

## 2018-02-05 DIAGNOSIS — E119 Type 2 diabetes mellitus without complications: Secondary | ICD-10-CM | POA: Diagnosis not present

## 2018-02-05 MED FILL — SOLIQUA 100 UNIT-33 MCG/ML: 100-33 | 34 days supply | Qty: 15 | Fill #2

## 2018-02-12 DIAGNOSIS — Z7189 Other specified counseling: Secondary | ICD-10-CM | POA: Diagnosis not present

## 2018-02-12 DIAGNOSIS — E1165 Type 2 diabetes mellitus with hyperglycemia: Secondary | ICD-10-CM | POA: Diagnosis not present

## 2018-02-12 MED FILL — ESZOPICLONE 2 MG TAB: 2 | 10 days supply | Qty: 10 | Fill #0

## 2018-02-13 MED FILL — EDARBYCLOR 40-25 MG TABLET: 40-25 | 30 days supply | Qty: 30 | Fill #1

## 2018-03-02 MED FILL — XIGDUO XR 5 MG-500 MG TAB: 5-500 | 30 days supply | Qty: 30 | Fill #3

## 2018-03-02 MED FILL — FENOFIBRATE 145 MG TAB: 145 | 90 days supply | Qty: 90 | Fill #1

## 2018-03-11 MED FILL — SOLIQUA 100 UNIT-33 MCG/ML: 100-33 | 34 days supply | Qty: 15 | Fill #3

## 2018-03-11 MED FILL — EDARBYCLOR 40-25 MG TABLET: 40-25 | 30 days supply | Qty: 30 | Fill #2

## 2018-03-11 MED FILL — OMEPRAZOLE 20 MG CAP: 20 | 30 days supply | Qty: 60 | Fill #1

## 2018-03-31 DIAGNOSIS — K580 Irritable bowel syndrome with diarrhea: Secondary | ICD-10-CM | POA: Diagnosis not present

## 2018-03-31 DIAGNOSIS — E669 Obesity, unspecified: Secondary | ICD-10-CM | POA: Diagnosis not present

## 2018-03-31 DIAGNOSIS — K219 Gastro-esophageal reflux disease without esophagitis: Secondary | ICD-10-CM | POA: Diagnosis not present

## 2018-03-31 MED FILL — OSCIMIN SR 0.375 MG TABLET: 0.375 | 30 days supply | Qty: 60 | Fill #0

## 2018-03-31 MED FILL — XIGDUO XR 5 MG-500 MG TAB: 5-500 | 30 days supply | Qty: 30 | Fill #4

## 2018-03-31 MED FILL — HUMALOG 100 UNITS/ML KWIKPE: 100 | 84 days supply | Qty: 9 | Fill #1

## 2018-04-15 MED FILL — SOLIQUA 100 UNIT-33 MCG/ML: 100-33 | 34 days supply | Qty: 15 | Fill #0

## 2018-04-15 MED FILL — EDARBYCLOR 40-25 MG TABLET: 40-25 | 30 days supply | Qty: 30 | Fill #3

## 2018-04-15 MED FILL — UNIFINE PENTIPS 31GX3/16": 31G X 5 MM | 90 days supply | Qty: 300 | Fill #1

## 2018-04-15 MED FILL — OMEPRAZOLE 20 MG CAP: 20 | 30 days supply | Qty: 60 | Fill #0

## 2018-04-15 MED FILL — UNIFINE PENTIPS 31GX3/16: 31G X 5 MM | 90 days supply | Qty: 300 | Fill #1

## 2018-05-01 MED FILL — OSCIMIN SR 0.375 MG TABLET: 0.375 | 30 days supply | Qty: 60 | Fill #1

## 2018-05-01 MED FILL — XIGDUO XR 5 MG-500 MG TAB: 5-500 | 30 days supply | Qty: 30 | Fill #5

## 2018-05-04 MED FILL — VIBERZI 100 MG TABLET: 100 | 90 days supply | Qty: 180 | Fill #0

## 2018-05-13 DIAGNOSIS — E1165 Type 2 diabetes mellitus with hyperglycemia: Secondary | ICD-10-CM | POA: Diagnosis not present

## 2018-05-18 DIAGNOSIS — E1165 Type 2 diabetes mellitus with hyperglycemia: Secondary | ICD-10-CM | POA: Diagnosis not present

## 2018-05-18 DIAGNOSIS — J0100 Acute maxillary sinusitis, unspecified: Secondary | ICD-10-CM | POA: Diagnosis not present

## 2018-05-18 DIAGNOSIS — F439 Reaction to severe stress, unspecified: Secondary | ICD-10-CM | POA: Diagnosis not present

## 2018-05-18 MED FILL — AZITHROMYCIN 250 MG TABLET: 250 | 5 days supply | Qty: 6 | Fill #0

## 2018-05-18 MED FILL — OMEPRAZOLE 20 MG CPDR: 20 | 30 days supply | Qty: 60 | Fill #1

## 2018-05-18 MED FILL — SOLIQUA 100 UNIT-33 MCG/ML: 100-33 | 34 days supply | Qty: 15 | Fill #1

## 2018-05-18 MED FILL — EDARBYCLOR 40-25 MG TABLET: 40-25 | 30 days supply | Qty: 30 | Fill #4

## 2018-05-19 DIAGNOSIS — E119 Type 2 diabetes mellitus without complications: Secondary | ICD-10-CM | POA: Diagnosis not present

## 2018-05-19 DIAGNOSIS — E785 Hyperlipidemia, unspecified: Secondary | ICD-10-CM | POA: Diagnosis not present

## 2018-05-19 DIAGNOSIS — Z6828 Body mass index (BMI) 28.0-28.9, adult: Secondary | ICD-10-CM | POA: Diagnosis not present

## 2018-05-19 DIAGNOSIS — I10 Essential (primary) hypertension: Secondary | ICD-10-CM | POA: Diagnosis not present

## 2018-05-19 DIAGNOSIS — E1165 Type 2 diabetes mellitus with hyperglycemia: Secondary | ICD-10-CM | POA: Diagnosis not present

## 2018-05-19 DIAGNOSIS — G4733 Obstructive sleep apnea (adult) (pediatric): Secondary | ICD-10-CM | POA: Diagnosis not present

## 2018-06-01 MED FILL — FENOFIBRATE 145 MG TAB: 145 | 90 days supply | Qty: 90 | Fill #2

## 2018-06-01 MED FILL — XIGDUO XR 5 MG-500 MG TAB: 5-500 | 30 days supply | Qty: 30 | Fill #6

## 2018-06-10 DIAGNOSIS — E119 Type 2 diabetes mellitus without complications: Secondary | ICD-10-CM | POA: Diagnosis not present

## 2018-06-17 DIAGNOSIS — G4733 Obstructive sleep apnea (adult) (pediatric): Secondary | ICD-10-CM | POA: Diagnosis not present

## 2018-06-17 DIAGNOSIS — E119 Type 2 diabetes mellitus without complications: Secondary | ICD-10-CM | POA: Diagnosis not present

## 2018-06-17 DIAGNOSIS — J0100 Acute maxillary sinusitis, unspecified: Secondary | ICD-10-CM | POA: Diagnosis not present

## 2018-06-17 DIAGNOSIS — E785 Hyperlipidemia, unspecified: Secondary | ICD-10-CM | POA: Diagnosis not present

## 2018-06-17 DIAGNOSIS — Z6828 Body mass index (BMI) 28.0-28.9, adult: Secondary | ICD-10-CM | POA: Diagnosis not present

## 2018-06-17 DIAGNOSIS — I10 Essential (primary) hypertension: Secondary | ICD-10-CM | POA: Diagnosis not present

## 2018-06-17 MED FILL — SOLIQUA 100 UNIT-33 MCG/ML: 100-33 | 34 days supply | Qty: 15 | Fill #2

## 2018-06-17 MED FILL — HUMALOG 100 UNITS/ML KWIKPE: 100 | 84 days supply | Qty: 9 | Fill #2

## 2018-06-17 MED FILL — EDARBYCLOR 40-25 MG TABLET: 40-25 | 30 days supply | Qty: 30 | Fill #5

## 2018-06-23 MED FILL — OMEPRAZOLE 20 MG CPDR: 20 | 30 days supply | Qty: 60 | Fill #2

## 2018-06-25 MED FILL — XIGDUO XR 5 MG-500 MG TAB: 5-500 | 30 days supply | Qty: 30 | Fill #0

## 2018-06-26 MED FILL — ACCU-CHEK GUIDE STRP: 90 days supply | Qty: 300 | Fill #1

## 2018-07-20 MED FILL — EDARBYCLOR 40-25 MG TABLET: 40-25 | 30 days supply | Qty: 30 | Fill #6

## 2018-07-20 MED FILL — OMEPRAZOLE 20 MG CPDR: 20 | 30 days supply | Qty: 60 | Fill #3

## 2018-07-20 MED FILL — OSCIMIN SR 0.375 MG TABLET: 0.375 | 30 days supply | Qty: 60 | Fill #2

## 2018-07-20 MED FILL — SOLIQUA 100 UNIT-33 MCG/ML: 100-33 | 34 days supply | Qty: 15 | Fill #3

## 2018-07-20 MED FILL — XIGDUO XR 5 MG-500 MG TAB: 5-500 | 30 days supply | Qty: 30 | Fill #1

## 2018-08-18 MED FILL — EDARBYCLOR 40-25 MG TABLET: 40-25 | 30 days supply | Qty: 30 | Fill #7

## 2018-08-18 MED FILL — SOLIQUA 100 UNIT-33 MCG/ML: 100-33 | 34 days supply | Qty: 15 | Fill #0

## 2018-08-31 MED FILL — CEFPODOXIME 200 MG TABLET: 200 | 7 days supply | Qty: 14 | Fill #0

## 2018-08-31 MED FILL — OSCIMIN SR 0.375 MG TABLET: 0.375 | 30 days supply | Qty: 60 | Fill #3

## 2018-08-31 MED FILL — FENOFIBRATE 145 MG TABLET: 145 | 90 days supply | Qty: 90 | Fill #3

## 2018-08-31 MED FILL — XIGDUO XR 5 MG-500 MG TAB: 5-500 | 30 days supply | Qty: 30 | Fill #2

## 2018-08-31 MED FILL — OMEPRAZOLE 20 MG CPDR: 20 | 30 days supply | Qty: 60 | Fill #4

## 2018-09-17 MED FILL — UNIFINE PENTIPS 31GX3/16: 31G X 5 MM | 90 days supply | Qty: 300 | Fill #2

## 2018-09-17 MED FILL — SOLIQUA 100 UNIT-33 MCG/ML: 100-33 | 30 days supply | Qty: 15 | Fill #0

## 2018-09-17 MED FILL — UNIFINE PENTIPS 31GX3/16": 31G X 5 MM | 90 days supply | Qty: 300 | Fill #2

## 2018-09-17 MED FILL — EDARBYCLOR 40-25 MG TABLET: 40-25 | 30 days supply | Qty: 30 | Fill #8

## 2018-09-24 MED FILL — DOXYCYCLINE HYC 100 MG CAPS: 100 | 7 days supply | Qty: 14 | Fill #0

## 2018-10-05 MED FILL — XIGDUO XR 5 MG-500 MG TAB: 5-500 | 30 days supply | Qty: 30 | Fill #3

## 2018-10-05 MED FILL — OMEPRAZOLE 20 MG CPDR: 20 | 30 days supply | Qty: 60 | Fill #5

## 2018-10-12 MED FILL — VIBERZI 100 MG TABS: 100 | 90 days supply | Qty: 180 | Fill #1

## 2018-10-12 MED FILL — HUMALOG 100 UNITS/ML KWIKPE: 100 | 84 days supply | Qty: 9 | Fill #3 | Status: TO

## 2018-10-19 MED FILL — EDARBYCLOR 40-25 MG TABLET: 40-25 | 30 days supply | Qty: 30 | Fill #9

## 2018-10-19 MED FILL — SOLIQUA 100 UNIT-33 MCG/ML: 100-33 | 30 days supply | Qty: 15 | Fill #1

## 2018-10-28 MED FILL — OMEPRAZOLE 20 MG CPDR: 20 | 30 days supply | Qty: 60 | Fill #6 | Status: TO

## 2018-10-28 MED FILL — XIGDUO XR 5 MG-500 MG TAB: 5-500 | 30 days supply | Qty: 30 | Fill #4 | Status: TO

## 2018-11-10 MED FILL — EDARBYCLOR 40-25 MG TABLET: 40-25 | 30 days supply | Qty: 30 | Fill #10

## 2018-11-10 MED FILL — OSCIMIN SR 0.375 MG TABLET: 0.375 | 30 days supply | Qty: 60 | Fill #4

## 2018-11-10 MED FILL — SOLIQUA 100 UNIT-33 MCG/ML: 100-33 | 30 days supply | Qty: 15 | Fill #2 | Status: TO

## 2018-11-26 MED FILL — OMEPRAZOLE 20 MG CPDR: 20 | 90 days supply | Qty: 180 | Fill #0 | Status: TO

## 2018-11-26 MED FILL — XIGDUO XR 5 MG-500 MG TAB: 5-500 | 30 days supply | Qty: 30 | Fill #0

## 2018-12-15 MED FILL — SOLIQUA 100 UNIT-33 MCG/ML: 100-33 | 30 days supply | Qty: 15 | Fill #0 | Status: TO

## 2018-12-28 MED FILL — XIGDUO XR 5 MG-500 MG TAB: 5-500 | 30 days supply | Qty: 30 | Fill #1

## 2018-12-28 MED FILL — EDARBYCLOR 40-25 MG TABLET: 40-25 | 30 days supply | Qty: 30 | Fill #0 | Status: TO

## 2018-12-28 MED FILL — FENOFIBRATE 145 MG TABS: 145 | 90 days supply | Qty: 90 | Fill #0

## 2018-12-28 MED FILL — HUMALOG 100 UNITS/ML KWIKPE: 100 | 75 days supply | Qty: 6 | Fill #0 | Status: TO

## 2019-01-11 MED FILL — UNIFINE PENTIPS 31GX3/16": 31G X 5 MM | 90 days supply | Qty: 300 | Fill #0 | Status: TO

## 2019-01-11 MED FILL — UNIFINE PENTIPS 31GX3/16: 31G X 5 MM | 90 days supply | Qty: 300 | Fill #0 | Status: TO

## 2019-01-19 MED FILL — SOLIQUA 100 UNIT-33 MCG/ML: 100-33 | 30 days supply | Qty: 15 | Fill #0

## 2019-01-20 MED FILL — VIBERZI 100 MG TABS: 100 | 90 days supply | Qty: 180 | Fill #0

## 2019-01-20 MED FILL — XIGDUO XR 10-500 MG TB24: 10-500 | 30 days supply | Qty: 30 | Fill #0

## 2019-01-21 MED FILL — OSCIMIN SR 0.375 MG TABLET: 0.375 | 30 days supply | Qty: 60 | Fill #5

## 2019-01-25 MED FILL — EDARBYCLOR 40-25 MG TABLET: 40-25 | 30 days supply | Qty: 30 | Fill #0

## 2019-02-08 MED FILL — OMEPRAZOLE 20 MG CPDR: 20 | 90 days supply | Qty: 180 | Fill #0

## 2019-02-08 MED FILL — ALBUTEROL SULFATE HFA 108 (: 108 (90 BAS | 25 days supply | Qty: 18 | Fill #0

## 2019-02-15 MED FILL — SOLIQUA 100 UNIT-33 MCG/ML: 100-33 | 30 days supply | Qty: 15 | Fill #1

## 2019-02-15 MED FILL — XIGDUO XR 10-500 MG TB24: 10-500 | 30 days supply | Qty: 30 | Fill #1

## 2019-02-24 MED FILL — FREESTYLE LIBRE 14 DAY SENS: 28 days supply | Qty: 2 | Fill #0

## 2019-02-24 MED FILL — EDARBYCLOR 40-25 MG TABLET: 40-25 | 30 days supply | Qty: 30 | Fill #1

## 2019-03-01 MED FILL — HUMALOG 100 UNITS/ML KWIKPE: 100 | 56 days supply | Qty: 6 | Fill #0

## 2019-03-17 MED FILL — OSCIMIN SR 0.375 MG TABLET: 0.375 | 30 days supply | Qty: 60 | Fill #6

## 2019-03-17 MED FILL — XIGDUO XR 10-500 MG TB24: 10-500 | 30 days supply | Qty: 30 | Fill #2

## 2019-03-17 MED FILL — SOLIQUA 100 UNIT-33 MCG/ML: 100-33 | 30 days supply | Qty: 15 | Fill #0

## 2019-03-17 MED FILL — FENOFIBRATE 145 MG TAB: 145 | 90 days supply | Qty: 90 | Fill #0

## 2019-03-18 MED FILL — EDARBYCLOR 40-25 MG TABLET: 40-25 | 30 days supply | Qty: 30 | Fill #2

## 2019-03-31 MED FILL — UNIFINE PENTIPS 31GX3/16": 31G X 5 MM | 90 days supply | Qty: 300 | Fill #0

## 2019-03-31 MED FILL — ACCU-CHEK GUIDE STRP: 90 days supply | Qty: 300 | Fill #0

## 2019-03-31 MED FILL — FREESTYLE LIBRE 14 DAY SENS: 28 days supply | Qty: 2 | Fill #0

## 2019-03-31 MED FILL — UNIFINE PENTIPS 31GX3/16: 31G X 5 MM | 90 days supply | Qty: 300 | Fill #0

## 2019-03-31 MED FILL — HYOSCYAMINE 0.125 MG ODT: 0.125 | 25 days supply | Qty: 100 | Fill #0

## 2019-04-15 MED FILL — XIGDUO XR 10-500 MG TB24: 10-500 | 30 days supply | Qty: 30 | Fill #3

## 2019-04-15 MED FILL — SOLIQUA 100 UNIT-33 MCG/ML: 100-33 | 30 days supply | Qty: 15 | Fill #1

## 2019-04-19 MED FILL — OSCIMIN SR 0.375 MG TABLET: 0.375 | 30 days supply | Qty: 60 | Fill #0

## 2019-04-19 MED FILL — EDARBYCLOR 40-25 MG TABLET: 40-25 | 30 days supply | Qty: 30 | Fill #3

## 2019-04-19 MED FILL — HUMALOG 100 UNITS/ML KWIKPE: 100 | 56 days supply | Qty: 6 | Fill #1

## 2019-04-28 MED FILL — FREESTYLE LIBRE 14 DAY SENS: 28 days supply | Qty: 2 | Fill #1

## 2019-05-17 MED FILL — XIGDUO XR 10-500 MG TB24: 10-500 | 30 days supply | Qty: 30 | Fill #4

## 2019-05-17 MED FILL — EDARBYCLOR 40-25 MG TABLET: 40-25 | 30 days supply | Qty: 30 | Fill #4

## 2019-05-17 MED FILL — SOLIQUA 100 UNIT-33 MCG/ML: 100-33 | 30 days supply | Qty: 15 | Fill #2

## 2019-05-31 MED FILL — HUMALOG 100 UNITS/ML KWIKPE: 100 | 30 days supply | Qty: 6 | Fill #0

## 2019-06-01 ENCOUNTER — Other Ambulatory Visit: Payer: Self-pay

## 2019-06-01 DIAGNOSIS — Z20822 Contact with and (suspected) exposure to covid-19: Secondary | ICD-10-CM

## 2019-06-01 MED FILL — AZITHROMYCIN 250 MG TABLET: 250 | 5 days supply | Qty: 6 | Fill #0

## 2019-06-03 MED FILL — FREESTYLE LIBRE 14 DAY SENS: 28 days supply | Qty: 2 | Fill #2

## 2019-06-03 MED FILL — OMEPRAZOLE 20 MG CAPSULE DR: 20 | 90 days supply | Qty: 180 | Fill #1

## 2019-06-04 LAB — NOVEL CORONAVIRUS, NAA: SARS-CoV-2, NAA: NOT DETECTED

## 2019-06-14 MED FILL — SOLIQUA 100 UNIT-33 MCG/ML: 100-33 | 30 days supply | Qty: 15 | Fill #3

## 2019-06-22 MED FILL — XIGDUO XR 10-500 MG TB24: 10-500 | 30 days supply | Qty: 30 | Fill #5

## 2019-06-22 MED FILL — UNIFINE PENTIPS 31GX3/16: 31G X 5 MM | 90 days supply | Qty: 300 | Fill #0

## 2019-06-22 MED FILL — EDARBYCLOR 40-25 MG TABLET: 40-25 | 30 days supply | Qty: 30 | Fill #5

## 2019-06-23 MED FILL — NITROFURANTOIN MONO-MCR 100: 100 | 5 days supply | Qty: 10 | Fill #0

## 2019-06-23 MED FILL — FENOFIBRATE 145 MG TABS: 145 | 90 days supply | Qty: 90 | Fill #0

## 2019-06-29 MED FILL — FREESTYLE LIBRE 14 DAY SENS: 28 days supply | Qty: 2 | Fill #3

## 2019-07-06 MED FILL — HYOSCYAMINE 0.125 MG ODT: 0.125 | 25 days supply | Qty: 100 | Fill #1

## 2019-07-06 MED FILL — ALBUTEROL SULFATE HFA 108 (: 108 (90 BAS | 25 days supply | Qty: 18 | Fill #1

## 2019-07-06 MED FILL — HUMALOG 100 UNITS/ML KWIKPE: 100 | 30 days supply | Qty: 6 | Fill #1

## 2019-07-06 MED FILL — VIBERZI 100 MG TABS: 100 | 90 days supply | Qty: 180 | Fill #1

## 2019-07-07 MED FILL — OSCIMIN SR 0.375 MG TABLET: 0.375 | 30 days supply | Qty: 60 | Fill #1

## 2019-07-08 MED FILL — SOLIQUA 100 UNIT-33 MCG/ML: 100-33 | 30 days supply | Qty: 15 | Fill #4

## 2019-07-20 ENCOUNTER — Other Ambulatory Visit: Payer: Self-pay | Admitting: Internal Medicine

## 2019-07-20 DIAGNOSIS — R911 Solitary pulmonary nodule: Secondary | ICD-10-CM

## 2019-07-27 MED FILL — XIGDUO XR 10-500 MG TB24: 10-500 | 30 days supply | Qty: 30 | Fill #0

## 2019-07-27 MED FILL — FREESTYLE LIBRE 14 DAY SENS: 28 days supply | Qty: 2 | Fill #4

## 2019-07-27 MED FILL — EDARBYCLOR 40-25 MG TABLET: 40-25 | 30 days supply | Qty: 30 | Fill #6

## 2019-08-02 ENCOUNTER — Other Ambulatory Visit: Payer: Self-pay

## 2019-08-02 ENCOUNTER — Ambulatory Visit
Admission: RE | Admit: 2019-08-02 | Discharge: 2019-08-02 | Disposition: A | Discharge status: Home / Self Care | Payer: No Typology Code available for payment source | Source: Ambulatory Visit | Attending: Internal Medicine | Admitting: Internal Medicine

## 2019-08-02 DIAGNOSIS — R911 Solitary pulmonary nodule: Secondary | ICD-10-CM

## 2019-08-11 MED FILL — HUMALOG 100 UNITS/ML KWIKPE: 100 | 30 days supply | Qty: 6 | Fill #2

## 2019-08-11 MED FILL — SOLIQUA 100 UNIT-33 MCG/ML: 100-33 | 30 days supply | Qty: 15 | Fill #5

## 2019-08-11 MED FILL — OSCIMIN SR 0.375 MG TABLET: 0.375 | 30 days supply | Qty: 60 | Fill #2

## 2019-08-23 MED FILL — XIGDUO XR 10-500 MG TB24: 10-500 | 30 days supply | Qty: 30 | Fill #1

## 2019-08-23 MED FILL — EDARBYCLOR 40-25 MG TABLET: 40-25 | 30 days supply | Qty: 30 | Fill #7

## 2019-08-24 MED FILL — FREESTYLE LIBRE 14 DAY SENS: 28 days supply | Qty: 2 | Fill #5

## 2019-09-06 MED FILL — FENOFIBRATE 145 MG TABS: 145 | 90 days supply | Qty: 90 | Fill #0

## 2019-09-06 MED FILL — SOLIQUA 100 UNIT-33 MCG/ML: 100-33 | 30 days supply | Qty: 15 | Fill #6

## 2019-09-20 MED FILL — HUMALOG 100 UNITS/ML KWIKPE: 100 | 30 days supply | Qty: 6 | Fill #3

## 2019-09-20 MED FILL — OMEPRAZOLE DR 20 MG CAPSULE: 20 | 45 days supply | Qty: 90 | Fill #2

## 2019-09-20 MED FILL — XIGDUO XR 10-500 MG TB24: 10-500 | 30 days supply | Qty: 30 | Fill #2

## 2019-09-20 MED FILL — OSCIMIN SR 0.375 MG TABLET: 0.375 | 30 days supply | Qty: 60 | Fill #3

## 2019-09-20 MED FILL — EDARBYCLOR 40-25 MG TABLET: 40-25 | 30 days supply | Qty: 30 | Fill #8

## 2019-09-20 MED FILL — FREESTYLE LIBRE 14 DAY SENS: 28 days supply | Qty: 2 | Fill #6

## 2019-09-29 MED FILL — AMOX-CLAV 875-125 MG TABLET: 875-125 | 7 days supply | Qty: 14 | Fill #0

## 2019-10-04 MED FILL — UNIFINE PENTIPS 31GX3/16": 31G X 5 MM | 90 days supply | Qty: 300 | Fill #1

## 2019-10-04 MED FILL — SOLIQUA 100 UNIT-33 MCG/ML: 100-33 | 30 days supply | Qty: 15 | Fill #7

## 2019-10-04 MED FILL — UNIFINE PENTIPS 31GX3/16: 31G X 5 MM | 90 days supply | Qty: 300 | Fill #1

## 2019-10-04 MED FILL — VIBERZI 100 MG TABS: 100 | 30 days supply | Qty: 60 | Fill #0

## 2019-10-19 MED FILL — FREESTYLE LIBRE 14 DAY SENS: 28 days supply | Qty: 2 | Fill #0

## 2019-10-26 MED FILL — OMEPRAZOLE DR 20 MG CAPSULE: 20 | 45 days supply | Qty: 90 | Fill #0

## 2019-10-26 MED FILL — EDARBYCLOR 40-25 MG TABLET: 40-25 | 30 days supply | Qty: 30 | Fill #9

## 2019-10-27 MED FILL — XIGDUO XR 10-500 MG TB24: 10-500 | 90 days supply | Qty: 90 | Fill #0

## 2019-11-01 MED FILL — VIBERZI 100 MG TABS: 100 | 30 days supply | Qty: 60 | Fill #1

## 2019-11-01 MED FILL — HUMALOG 100 UNITS/ML KWIKPE: 100 | 30 days supply | Qty: 6 | Fill #0

## 2019-11-01 MED FILL — SOLIQUA 100 UNIT-33 MCG/ML: 100-33 | 30 days supply | Qty: 15 | Fill #8

## 2019-11-11 MED FILL — FREESTYLE LIBRE 14 DAY SENS: 28 days supply | Qty: 2 | Fill #1

## 2019-11-22 MED FILL — AMOX-CLAV 875-125 MG TABLET: 875-125 | 10 days supply | Qty: 20 | Fill #0

## 2019-11-29 ENCOUNTER — Other Ambulatory Visit (HOSPITAL_COMMUNITY): Payer: Self-pay | Admitting: Internal Medicine

## 2019-11-29 MED FILL — HUMALOG 100 UNITS/ML KWIKPE: 100 | 16 days supply | Qty: 3 | Fill #1

## 2019-11-29 MED FILL — EDARBYCLOR 40-25 MG TABLET: 40-25 | 30 days supply | Qty: 30 | Fill #0

## 2019-11-29 MED FILL — SOLIQUA 100 UNIT-33 MCG/ML: 100-33 | 30 days supply | Qty: 15 | Fill #9

## 2019-12-06 MED FILL — NITROFURANTOIN MONO-MCR 100: 100 | 7 days supply | Qty: 14 | Fill #0

## 2019-12-07 MED FILL — OMEPRAZOLE DR 20 MG CAPSULE: 20 | 90 days supply | Qty: 180 | Fill #0

## 2019-12-07 MED FILL — VIBERZI 100 MG TABS: 100 | 30 days supply | Qty: 60 | Fill #2

## 2019-12-07 MED FILL — ALBUTEROL SULFATE HFA 108 (: 108 (90 BAS | 25 days supply | Qty: 18 | Fill #2

## 2019-12-09 MED FILL — FREESTYLE LIBRE 14 DAY SENS: 28 days supply | Qty: 2 | Fill #2

## 2019-12-21 MED FILL — FENOFIBRATE 145 MG TABS: 145 | 90 days supply | Qty: 90 | Fill #1

## 2019-12-21 MED FILL — UNIFINE PENTIPS 31GX3/16: 31G X 5 MM | 90 days supply | Qty: 300 | Fill #2

## 2019-12-21 MED FILL — HUMALOG 100 UNITS/ML KWIKPE: 100 | 84 days supply | Qty: 15 | Fill #0

## 2019-12-28 MED FILL — SOLIQUA 100 UNIT-33 MCG/ML: 100-33 | 30 days supply | Qty: 15 | Fill #10

## 2019-12-28 MED FILL — EDARBYCLOR 40-25 MG TABLET: 40-25 | 30 days supply | Qty: 30 | Fill #1

## 2019-12-31 MED FILL — FREESTYLE LIBRE 14 DAY SENS: 28 days supply | Qty: 2 | Fill #3

## 2020-01-04 ENCOUNTER — Other Ambulatory Visit (HOSPITAL_COMMUNITY): Payer: Self-pay | Admitting: Internal Medicine

## 2020-01-11 MED FILL — VIBERZI 100 MG TABS: 100 | 30 days supply | Qty: 60 | Fill #3

## 2020-01-11 MED FILL — OSCIMIN SR 0.375 MG TABLET: 0.375 | 30 days supply | Qty: 60 | Fill #4

## 2020-01-25 MED FILL — EDARBYCLOR 40-25 MG TABLET: 40-25 | 30 days supply | Qty: 30 | Fill #2

## 2020-01-25 MED FILL — FREESTYLE LIBRE 14 DAY SENS: 28 days supply | Qty: 2 | Fill #4

## 2020-01-25 MED FILL — XIGDUO XR 10-500 MG TB24: 10-500 | 90 days supply | Qty: 90 | Fill #0

## 2020-02-15 ENCOUNTER — Other Ambulatory Visit: Payer: Self-pay

## 2020-02-15 ENCOUNTER — Encounter: Payer: Self-pay | Admitting: Allergy and Immunology

## 2020-02-15 ENCOUNTER — Other Ambulatory Visit: Payer: Self-pay | Admitting: Allergy and Immunology

## 2020-02-15 ENCOUNTER — Ambulatory Visit: Payer: No Typology Code available for payment source | Admitting: Allergy and Immunology

## 2020-02-15 VITALS — BP 112/72 | HR 76 | Temp 97.8°F | Resp 16 | Ht 66.5 in | Wt 189.2 lb

## 2020-02-15 DIAGNOSIS — J324 Chronic pansinusitis: Secondary | ICD-10-CM | POA: Diagnosis not present

## 2020-02-15 DIAGNOSIS — J4530 Mild persistent asthma, uncomplicated: Secondary | ICD-10-CM

## 2020-02-15 DIAGNOSIS — R911 Solitary pulmonary nodule: Secondary | ICD-10-CM

## 2020-02-15 DIAGNOSIS — K219 Gastro-esophageal reflux disease without esophagitis: Secondary | ICD-10-CM

## 2020-02-15 DIAGNOSIS — J3089 Other allergic rhinitis: Secondary | ICD-10-CM

## 2020-02-15 DIAGNOSIS — J479 Bronchiectasis, uncomplicated: Secondary | ICD-10-CM

## 2020-02-15 MED ORDER — ALVESCO 160 MCG/ACT IN AERS: INHALATION_SPRAY | RESPIRATORY_TRACT | 5 refills | Status: DC

## 2020-02-15 MED ORDER — OMEPRAZOLE 40 MG PO CPDR: 40.0000 mg | DELAYED_RELEASE_CAPSULE | Freq: Two times a day (BID) | ORAL | 5 refills | Status: DC

## 2020-02-15 MED FILL — OMEPRAZOLE 40 MG CPDR: 40 | 30 days supply | Qty: 60 | Fill #0

## 2020-02-15 NOTE — Patient Instructions (Addendum)
  1.  Allergen avoidance measures - mold  2.  Treat and prevent inflammation:   A. OTC Nasacort - 1 spray each nostril 2 times per day  B. Alvesco 160 - 1 inhalation 2 times per day w/ spacer ([facility])  3.  Treat and prevent reflux/LPR:   A. Omeprazole 40 mg - 1 tablet 2 times per day  B. Slowly consolidate all caffeine consumption  4.  If needed:   A. Albuterol HFA - 2 inhalations every 4-6 hours  B. Cetirizine 10 mg - 1 tablet 1 time per day  5.  Return to clinic in 4 weeks or earlier if problem

## 2020-02-15 NOTE — Progress Notes (Signed)
Heather Stout - Heather Stout - Heather Stout - Heather Stout - Heather Stout   Dear Dr. Shelia Media,  Thank you for referring Heather Stout to the [facility] on 02/15/2020.   Below is a summation of this patient's evaluation and recommendations.  Thank you for your referral. I will keep you informed about this patient's response to treatment.   If you have any questions please do not hesitate to contact me.   Sincerely,  Jiles Prows, MD Allergy / Immunology [place]   ______________________________________________________________________    NEW PATIENT NOTE  Referring Provider: Deland Pretty, MD Primary Provider: Deland Pretty, MD Date of office visit: 02/15/2020    Subjective:   Chief Complaint:  Heather Stout (DOB: 1956/07/12) is a 64 y.o. female who presents to the clinic on 02/15/2020 with a chief complaint of Asthma .     HPI: Heather Stout presents to this clinic in evaluation of respiratory tract symptoms.  I had seen Heather Stout in this clinic over a decade ago for an issue with laryngopharyngeal reflux that appeared to respond quite well to the use of omeprazole.  At this Stout time she has no classic reflux symptoms and very little issue with her throat.  She continues to drink 2 coffees per day.  What is new for her and that is the fact that she has developed a "junky cough" over the course of the past several years.  She feels as though there is phlegm deep down in her chest..  On occasion she will develop issues with shortness of breath.  However, she can exercise without any problem and rarely has any nocturnal cough.  Apparently this past fall she was given a short acting bronchodilator which she does not really think helps her very much.  She was also given a inhaled steroid but she only used a sample.  She does have a long history of having nasal congestion and sneezing and clear rhinorrhea  and stuffiness without any anosmia or headaches.  Zyrtec appears to help this issue.  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  She informs me that she had a chest x-ray which identified a nodule and a CT scan which confirmed the nodule performed within the past year.  She has received 2 Pfizer Covid vaccinations.  Past Medical History:  Diagnosis Date  . Asthma   . Essential hypertension, benign   . Left anterior hemiblock   . Other and unspecified hyperlipidemia   . Type II or unspecified type diabetes mellitus without mention of complication, not stated as uncontrolled     Past Surgical History:  Procedure Laterality Date  . FUNCTIONAL ENDOSCOPIC SINUS SURGERY  1990  . LUMBAR LAMINECTOMY  2002  . LUMBAR LAMINECTOMY  2005  . TONSILLECTOMY  1980    Allergies as of 02/15/2020   No Known Allergies     Medication List    albuterol 108 (90 Base) MCG/ACT inhaler Commonly known as: VENTOLIN HFA Inhale into the lungs every 6 (six) hours as needed for wheezing or shortness of breath.   aspirin 81 MG tablet Take 81 mg by mouth daily.   Calcium 500 + D 500-125 MG-UNIT Tabs Generic drug: Calcium Carbonate-Vitamin D Take by mouth.   cetirizine 10 MG tablet Commonly known as: ZYRTEC Take 10 mg by mouth daily.   cholecalciferol 1000 units tablet Commonly known as: VITAMIN D Take 2,000  Units by mouth daily.   cholestyramine 4 GM/DOSE powder Commonly known as: QUESTRAN Take by mouth daily.   Edarbyclor 40-12.5 MG Tabs Generic drug: Azilsartan-Chlorthalidone Take by mouth.   Farxiga 10 MG Tabs tablet Generic drug: dapagliflozin propanediol Take 10 mg by mouth daily.   fenofibrate 145 MG tablet Commonly known as: TRICOR Take 145 mg by mouth daily.   HumaLOG KwikPen 100 UNIT/ML KwikPen Generic drug: insulin lispro SMARTSIG:Unit(s) SUB-Q   insulin glargine 100 UNIT/ML injection Commonly known as: LANTUS Inject  56 Units into the skin daily. Reported on 12/22/2015   Invokana 300 MG Tabs tablet Generic drug: canagliflozin Take 300 mg by mouth daily before breakfast.   liraglutide 18 MG/3ML Sopn Commonly known as: VICTOZA Inject 18 mg into the skin. Reported on 12/22/2015   Magnesium 250 MG Tabs Take 1 tablet by mouth.   omeprazole 20 MG capsule Commonly known as: PRILOSEC Take 20 mg by mouth 2 (two) times daily before a meal.   PROBIOTIC PO Take 1 capsule by mouth.   Soliqua 100-33 UNT-MCG/ML Sopn Generic drug: Insulin Glargine-Lixisenatide Inject 44 Units into the skin daily.   Viberzi 100 MG Tabs Generic drug: Eluxadoline Viberzi 100 mg tablet   VITAMIN B 12 PO Take by mouth.       Review of systems negative except as noted in HPI / PMHx or noted below:  Review of Systems  Constitutional: Negative.   HENT: Negative.   Eyes: Negative.   Respiratory: Negative.   Cardiovascular: Negative.   Gastrointestinal: Negative.   Genitourinary: Negative.   Musculoskeletal: Negative.   Skin: Negative.   Neurological: Negative.   Endo/Heme/Allergies: Negative.   Psychiatric/Behavioral: Negative.     Family History  Problem Relation Age of Onset  . Hypertension Father   . Hypertension Brother   . Diabetes Brother   . Hypertension Brother   . Crohn's disease Brother   . Hypertension Sister   . Diabetes Sister     Social History   Socioeconomic History  . Marital status: Divorced    Spouse name: Not on file  . Number of children: Not on file  . Years of education: Not on file  . Highest education level: Not on file  Occupational History  . Occupation: Insurance underwriter  Tobacco Use  . Smoking status: Never Smoker  . Smokeless tobacco: Never Used  Substance and Sexual Activity  . Alcohol use: No    Alcohol/week: 0.0 standard drinks  . Drug use: No  . Sexual activity: Not on file  Other Topics Concern  . Not on file  Social History Narrative  . 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  Objective:   Vitals:   02/15/20 1538  BP: 112/72  Pulse: 76  Resp: 16  Temp: 97.8 F (36.6 C)  SpO2: 96%   Height: 5' 6.5" (168.9 cm) Weight: 189 lb 3.2 oz (85.8 kg)  Physical Exam Constitutional:      Appearance: She is not diaphoretic.  HENT:     Head: Normocephalic. No right periorbital erythema or left periorbital erythema.     Right Ear: Tympanic membrane, ear canal and external ear normal.     Left Ear: Tympanic membrane, ear canal and external ear normal.  Nose: Nose normal. No mucosal edema or rhinorrhea.     Mouth/Throat:     Pharynx: No oropharyngeal exudate.  Eyes:     General: Lids are normal.     Conjunctiva/sclera: Conjunctivae normal.     Pupils: Pupils are equal, round, and reactive to light.  Neck:     Thyroid: No thyromegaly.     Trachea: Trachea normal. No tracheal deviation.  Cardiovascular:     Rate and Rhythm: Normal rate and regular rhythm.     Heart sounds: Normal heart sounds, S1 normal and S2 normal. No murmur heard.   Pulmonary:     Effort: Pulmonary effort is normal. No respiratory distress.     Breath sounds: No stridor. No wheezing or rales.  Chest:     Chest wall: No tenderness.  Abdominal:     General: There is no distension.     Palpations: Abdomen is soft. There is no mass.     Tenderness: There is no abdominal tenderness. There is no guarding or rebound.  Musculoskeletal:        General: No tenderness.  Lymphadenopathy:     Head:     Right side of head: No tonsillar adenopathy.     Left side of head: No tonsillar adenopathy.     Cervical: No cervical adenopathy.  Skin:    Coloration: Skin is not pale.     Findings: No erythema or rash.      Nails: There is no clubbing.  Neurological:     Mental Status: She is alert.     Diagnostics: Allergy skin tests were performed.  She demonstrated hypersensitivity to mold.  Spirometry was performed and demonstrated an FEV1 of 2.07 @ 92 % of predicted. FEV1/FVC = 0.78.  Following administration of inhaled albuterol her FEV1 did not improve significantly  Results of a chest CT scan obtained 02 August 2019 identified the following:  Cardiovascular: No significant vascular findings. Normal heart size. No pericardial effusion.  Mediastinum/Nodes: No enlarged mediastinal or axillary lymph nodes. Thyroid gland, trachea, and esophagus demonstrate no significant findings.  Lungs/Pleura: No pneumothorax or pleural effusion is noted. Bronchiectasis and bronchial thickening is seen involving the right lower lobe. Ill-defined opacities are noted in the right lower lobe which may represent scarring or inflammation. 8 mm nodule is noted posteriorly in the right lower lobe best seen on image number 87 of series 8. Left lung is unremarkable.  Upper Abdomen: Hepatic steatosis is noted.  Musculoskeletal: No chest wall mass or suspicious bone lesions identified.  Hepatic steatosis.  Assessment and Plan:    1. Not well controlled mild persistent asthma   2. Other allergic rhinitis   3. Chronic pansinusitis   4. LPRD (laryngopharyngeal reflux disease)   5. Bronchiectasis without complication (HCC)   6. Pulmonary nodule     1.  Allergen avoidance measures - mold  2.  Treat and prevent inflammation:   A. OTC Nasacort - 1 spray each nostril 2 times per day  B. Alvesco 160 - 1 inhalation 2 times per day w/ spacer ([facility])  3.  Treat and prevent reflux/LPR:   A. Omeprazole 40 mg - 1 tablet 2 times per day  B. Slowly consolidate all caffeine consumption  4.  If needed:   A. Albuterol HFA - 2 inhalations every 4-6 hours  B. Cetirizine 10 mg - 1 tablet 1 time per day  5.   Return to clinic in 4 weeks or earlier if problem  We will treat Heather Stout  with anti-inflammatory medications for both her upper and lower airway and be a little bit more aggressive about treating her LPR and see what type of response we received from this approach over the course of the next 4 weeks.  If she still remains symptomatic she is going to require further evaluation for her previously documented bronchiectasis and further evaluation of her previously documented chronic sinusitis.  In addition, she appears to have a pulmonary nodule of 8 mm diameter located in her right lower lobe that will require a repeat CT scan at some Stout in the near future.  I will see her back in this clinic in 4 weeks or earlier if there is a problem.  Jiles Prows, MD Allergy / Immunology [facility] [place]

## 2020-02-16 ENCOUNTER — Encounter: Payer: Self-pay | Admitting: Allergy and Immunology

## 2020-02-16 MED FILL — VIBERZI 100 MG TABS: 100 | 30 days supply | Qty: 60 | Fill #4

## 2020-02-16 MED FILL — FREESTYLE LIBRE 14 DAY SENS: 28 days supply | Qty: 2 | Fill #5

## 2020-02-24 ENCOUNTER — Telehealth: Payer: Self-pay

## 2020-02-24 NOTE — Telephone Encounter (Signed)
Prior auth submitted for Alvesco on covermymeds.

## 2020-02-29 ENCOUNTER — Other Ambulatory Visit (HOSPITAL_COMMUNITY): Payer: Self-pay | Admitting: Internal Medicine

## 2020-02-29 MED FILL — TRESIBA FLEXTOUCH 200 UNITS: 200 | 30 days supply | Qty: 6 | Fill #1

## 2020-02-29 MED FILL — EDARBYCLOR 40-25 MG TABLET: 40-25 | 90 days supply | Qty: 90 | Fill #3

## 2020-03-01 NOTE — Telephone Encounter (Signed)
I spoke with the pharmacy and was informed that they have already shipped the patient a 60 day supply for $50.00. I have also called the patient and informed her of this.

## 2020-03-01 NOTE — Telephone Encounter (Signed)
PA for Alvesco was denied through patients insurance. Patient needs to follow step therapy and try and fail both Arnuity Ellipta and Flovent (Diskus or HFA). Please advise on alternative.

## 2020-03-01 NOTE — Telephone Encounter (Signed)
Please contact Pharma rep. Apparently the company's system should work. Was this prescribed through the [facility] unit in [place]? Please troubleshoot this issue with Pharma rep.

## 2020-03-06 MED FILL — OZEMPIC (1 MG/DOSE) 4 MG/3M: 4 | 28 days supply | Qty: 3 | Fill #0

## 2020-03-07 MED FILL — UNIFINE PENTIPS 31GX3/16: 31G X 5 MM | 75 days supply | Qty: 300 | Fill #0

## 2020-03-20 ENCOUNTER — Other Ambulatory Visit (HOSPITAL_COMMUNITY): Payer: Self-pay | Admitting: Internal Medicine

## 2020-03-20 MED FILL — OSCIMIN SR 0.375 MG TABLET: 0.375 | 30 days supply | Qty: 60 | Fill #5

## 2020-03-20 MED FILL — OMEPRAZOLE 40 MG CPDR: 40 | 30 days supply | Qty: 60 | Fill #1

## 2020-03-20 MED FILL — HUMALOG 100 UNITS/ML KWIKPE: 100 | 30 days supply | Qty: 18 | Fill #1

## 2020-03-20 MED FILL — VIBERZI 100 MG TABS: 100 | 30 days supply | Qty: 60 | Fill #0

## 2020-03-20 MED FILL — FREESTYLE LIBRE 14 DAY SENS: 28 days supply | Qty: 2 | Fill #6

## 2020-03-20 MED FILL — FENOFIBRATE 145 MG TABS: 145 | 90 days supply | Qty: 90 | Fill #0

## 2020-03-21 ENCOUNTER — Other Ambulatory Visit: Payer: Self-pay

## 2020-03-21 ENCOUNTER — Ambulatory Visit: Payer: No Typology Code available for payment source | Admitting: Allergy and Immunology

## 2020-03-21 ENCOUNTER — Encounter: Payer: Self-pay | Admitting: Allergy and Immunology

## 2020-03-21 VITALS — BP 128/72 | HR 79 | Resp 16

## 2020-03-21 DIAGNOSIS — J4530 Mild persistent asthma, uncomplicated: Secondary | ICD-10-CM | POA: Diagnosis not present

## 2020-03-21 DIAGNOSIS — J324 Chronic pansinusitis: Secondary | ICD-10-CM

## 2020-03-21 DIAGNOSIS — J3089 Other allergic rhinitis: Secondary | ICD-10-CM

## 2020-03-21 DIAGNOSIS — J479 Bronchiectasis, uncomplicated: Secondary | ICD-10-CM | POA: Diagnosis not present

## 2020-03-21 DIAGNOSIS — K219 Gastro-esophageal reflux disease without esophagitis: Secondary | ICD-10-CM

## 2020-03-21 DIAGNOSIS — B999 Unspecified infectious disease: Secondary | ICD-10-CM

## 2020-03-21 NOTE — Progress Notes (Signed)
[place]   Follow-up Note  Referring Provider: Deland Pretty, MD Primary Provider: Deland Pretty, MD Date of Office Visit: 03/21/2020   Subjective:   Heather Stout (DOB: 1955-11-15) is a 64 y.o. female who returns to the [facility] on 03/21/2020 in re-evaluation of the following:  HPI: Heather Stout returns to this clinic in evaluation of asthma and history of bronchiectasis and allergic rhinitis and history of chronic sinusitis and LPR.  Her last visit to this clinic was 15 February 2020.  Her cough is about 40% better.  She still feels some phlegm deep down in her chest on occasion.  She has not been using any short acting bronchodilator.  She is consolidated her coffee to 1 coffee per day.  She continues to use a collection of anti-inflammatory agents for her airway and therapy directed against reflux on a consistent basis.  Allergies as of 03/21/2020   No Known Allergies     Medication List      albuterol 108 (90 Base) MCG/ACT inhaler Commonly known as: VENTOLIN HFA Inhale into the lungs every 6 (six) hours as needed for wheezing or shortness of breath.   Alvesco 160 MCG/ACT inhaler Generic drug: ciclesonide 1 inhalation 2 times per day with spacer   aspirin 81 MG tablet Take 81 mg by mouth daily.   Calcium 500 + D 500-125 MG-UNIT Tabs Generic drug: Calcium Carbonate-Vitamin D Take by mouth.   cetirizine 10 MG tablet Commonly known as: ZYRTEC Take 10 mg by mouth daily.   cholecalciferol 1000 units tablet Commonly known as: VITAMIN D Take 2,000 Units by mouth daily.   cholestyramine 4 GM/DOSE powder Commonly known as: QUESTRAN Take by mouth daily.   Edarbyclor 40-12.5 MG Tabs Generic drug: Azilsartan-Chlorthalidone Take by mouth.   Farxiga 10 MG Tabs tablet Generic drug: dapagliflozin propanediol Take 10 mg by mouth daily.   fenofibrate 145 MG tablet Commonly known as: TRICOR Take 145 mg by  mouth daily.   HumaLOG KwikPen 100 UNIT/ML KwikPen Generic drug: insulin lispro SMARTSIG:Unit(s) SUB-Q   insulin glargine 100 UNIT/ML injection Commonly known as: LANTUS Inject 56 Units into the skin daily. Reported on 12/22/2015   Invokana 300 MG Tabs tablet Generic drug: canagliflozin Take 300 mg by mouth daily before breakfast.   liraglutide 18 MG/3ML Sopn Commonly known as: VICTOZA Inject 18 mg into the skin. Reported on 12/22/2015   Magnesium 250 MG Tabs Take 1 tablet by mouth.   omeprazole 40 MG capsule Commonly known as: PRILOSEC Take 1 capsule (40 mg total) by mouth in the morning and at bedtime.   PROBIOTIC PO Take 1 capsule by mouth.   Soliqua 100-33 UNT-MCG/ML Sopn Generic drug: Insulin Glargine-Lixisenatide Inject 44 Units into the skin daily.   Viberzi 100 MG Tabs Generic drug: Eluxadoline Viberzi 100 mg tablet   VITAMIN B 12 PO Take by mouth.       Past Medical History:  Diagnosis Date  . Asthma   . Essential hypertension, benign   . Left anterior hemiblock   . Other and unspecified hyperlipidemia   . Type II or unspecified type diabetes mellitus without mention of complication, not stated as uncontrolled     Past Surgical History:  Procedure Laterality Date  . FUNCTIONAL ENDOSCOPIC SINUS SURGERY  1990  . LUMBAR LAMINECTOMY  2002  . LUMBAR LAMINECTOMY  2005  . TONSILLECTOMY  1980    Review of systems negative except as noted in  HPI / PMHx or noted below:  Review of Systems  Constitutional: Negative.   HENT: Negative.   Eyes: Negative.   Respiratory: Negative.   Cardiovascular: Negative.   Gastrointestinal: Negative.   Genitourinary: Negative.   Musculoskeletal: Negative.   Skin: Negative.   Neurological: Negative.   Endo/Heme/Allergies: Negative.   Psychiatric/Behavioral: Negative.      Objective:   Vitals:   03/21/20 1607  BP: 128/72  Pulse: 79  Resp: 16  SpO2: 97%          Physical Exam Constitutional:       Appearance: She is not diaphoretic.  HENT:     Head: Normocephalic.     Right Ear: Tympanic membrane, ear canal and external ear normal.     Left Ear: Tympanic membrane, ear canal and external ear normal.     Nose: Nose normal. No mucosal edema or rhinorrhea.     Mouth/Throat:     Pharynx: Uvula midline. No oropharyngeal exudate.  Eyes:     Conjunctiva/sclera: Conjunctivae normal.  Neck:     Thyroid: No thyromegaly.     Trachea: Trachea normal. No tracheal tenderness or tracheal deviation.  Cardiovascular:     Rate and Rhythm: Normal rate and regular rhythm.     Heart sounds: Normal heart sounds, S1 normal and S2 normal. No murmur heard.   Pulmonary:     Effort: No respiratory distress.     Breath sounds: Normal breath sounds. No stridor. No wheezing or rales.  Lymphadenopathy:     Head:     Right side of head: No tonsillar adenopathy.     Left side of head: No tonsillar adenopathy.     Cervical: No cervical adenopathy.  Skin:    Findings: No erythema or rash.     Nails: There is no clubbing.  Neurological:     Mental Status: She is alert.     Diagnostics:    Spirometry was performed and demonstrated an FEV1 of 2.05 at 92 % of predicted.  Assessment and Plan:   1. Not well controlled mild persistent asthma   2. Other allergic rhinitis   3. Chronic pansinusitis   4. Bronchiectasis without complication ([place])   5. LPRD (laryngopharyngeal reflux disease)   6. Recurrent infections     1.  Blood - IgA/G/M, anti-pneumo ab, anti-tetanus ab. CBC w/D  2.  Continue to Treat and prevent inflammation:   A. OTC Nasacort - 1 spray each nostril 2 times per day  B. Alvesco 160 - 1 inhalation 2 times per day w/ spacer ([facility])  3.  Continue to Treat and prevent reflux/LPR:   A. Omeprazole 40 mg - 1 tablet 2 times per day  B. Slowly consolidate all caffeine consumption  4.  If needed:   A. Albuterol HFA - 2 inhalations every 4-6 hours  B. Cetirizine 10 mg - 1 tablet 1  time per day  5.  Return to clinic in 8 weeks or earlier if problem  I think that Heather Stout's respiratory tract symptoms are better at this point in time while consistently using anti-inflammatory medications for her airway and consistently addressing her reflux issue.  I would like to see her back in this clinic in 8 weeks which will be a total of 12 weeks of therapy.  Given the fact that she has had chronic sinusitis and bronchiectasis we will evaluate her immune system for a B-cell defect contributing to these issues.  Allena Katz, MD Allergy / Immunology  [facility] 

## 2020-03-21 NOTE — Patient Instructions (Addendum)
  1.  Blood - IgA/G/M, anti-pneumo ab, anti-tetanus ab. CBC w/D  2.  Continue to Treat and prevent inflammation:   A. OTC Nasacort - 1 spray each nostril 2 times per day  B. Alvesco 160 - 1 inhalation 2 times per day w/ spacer ([facility])  3.  Continue to Treat and prevent reflux/LPR:   A. Omeprazole 40 mg - 1 tablet 2 times per day  B. Slowly consolidate all caffeine consumption  4.  If needed:   A. Albuterol HFA - 2 inhalations every 4-6 hours  B. Cetirizine 10 mg - 1 tablet 1 time per day  5.  Return to clinic in 8 weeks or earlier if problem

## 2020-03-22 ENCOUNTER — Encounter: Payer: Self-pay | Admitting: Allergy and Immunology

## 2020-03-22 NOTE — Addendum Note (Signed)
Addended by: Guy Franco on: 03/22/2020 04:13 PM   Modules accepted: Orders

## 2020-03-23 MED FILL — OZEMPIC (1 MG/DOSE) 4 MG/3M: 4 | 28 days supply | Qty: 3 | Fill #1

## 2020-03-30 ENCOUNTER — Other Ambulatory Visit (HOSPITAL_COMMUNITY): Payer: Self-pay | Admitting: Gastroenterology

## 2020-03-30 LAB — STREP PNEUMONIAE 23 SEROTYPES IGG
Pneumo Ab Type 1*: 2.1 ug/mL (ref >1.3)
Pneumo Ab Type 12 (12F)*: 1.5 ug/mL (ref >1.3)
Pneumo Ab Type 14*: 25.8 ug/mL (ref >1.3)
Pneumo Ab Type 17 (17F)*: 5.2 ug/mL (ref >1.3)
Pneumo Ab Type 19 (19F)*: 5 ug/mL (ref >1.3)
Pneumo Ab Type 2*: 6.2 ug/mL (ref >1.3)
Pneumo Ab Type 20*: 6.3 ug/mL (ref >1.3)
Pneumo Ab Type 22 (22F)*: 1.5 ug/mL (ref >1.3)
Pneumo Ab Type 23 (23F)*: 2.4 ug/mL (ref >1.3)
Pneumo Ab Type 26 (6B)*: 11.2 ug/mL (ref >1.3)
Pneumo Ab Type 3*: 7.2 ug/mL (ref >1.3)
Pneumo Ab Type 34 (10A)*: 1.4 ug/mL (ref >1.3)
Pneumo Ab Type 4*: 1.3 ug/mL — ABNORMAL LOW (ref >1.3)
Pneumo Ab Type 43 (11A)*: 2.9 ug/mL (ref >1.3)
Pneumo Ab Type 5*: 3.2 ug/mL (ref >1.3)
Pneumo Ab Type 51 (7F)*: 0.7 ug/mL — ABNORMAL LOW (ref >1.3)
Pneumo Ab Type 54 (15B)*: 11.2 ug/mL (ref >1.3)
Pneumo Ab Type 56 (18C)*: 7.8 ug/mL (ref >1.3)
Pneumo Ab Type 57 (19A)*: 7 ug/mL (ref >1.3)
Pneumo Ab Type 68 (9V)*: 2.4 ug/mL (ref >1.3)
Pneumo Ab Type 70 (33F)*: 2.6 ug/mL (ref >1.3)
Pneumo Ab Type 8*: 6.5 ug/mL (ref >1.3)
Pneumo Ab Type 9 (9N)*: 1.5 ug/mL (ref >1.3)

## 2020-03-30 LAB — CBC WITH DIFFERENTIAL
Basophils Absolute: 0.1 10*3/uL (ref 0.0–0.2)
Basos: 1 %
EOS (ABSOLUTE): 0.2 10*3/uL (ref 0.0–0.4)
Eos: 3 %
Hematocrit: 45.7 % (ref 34.0–46.6)
Hemoglobin: 14.4 g/dL (ref 11.1–15.9)
Immature Grans (Abs): 0 10*3/uL (ref 0.0–0.1)
Immature Granulocytes: 1 %
Lymphocytes Absolute: 1.4 10*3/uL (ref 0.7–3.1)
Lymphs: 24 %
MCH: 26 pg — ABNORMAL LOW (ref 26.6–33.0)
MCHC: 31.5 g/dL (ref 31.5–35.7)
MCV: 83 fL (ref 79–97)
Monocytes Absolute: 0.5 10*3/uL (ref 0.1–0.9)
Monocytes: 9 %
Neutrophils Absolute: 3.6 10*3/uL (ref 1.4–7.0)
Neutrophils: 62 %
RBC: 5.54 x10E6/uL — ABNORMAL HIGH (ref 3.77–5.28)
RDW: 13.1 % (ref 11.7–15.4)
WBC: 5.8 10*3/uL (ref 3.4–10.8)

## 2020-03-30 LAB — IGG, IGA, IGM
IgA/Immunoglobulin A, Serum: 130 mg/dL (ref 87–352)
IgG (Immunoglobin G), Serum: 789 mg/dL (ref 586–1602)
IgM (Immunoglobulin M), Srm: 58 mg/dL (ref 26–217)

## 2020-03-30 LAB — TETANUS ANTIBODY, IGG: Tetanus Ab, IgG: 1.77 IU/mL (ref <0.10)

## 2020-04-05 MED FILL — TRESIBA FLEXTOUCH 200 UNITS: 200 | 30 days supply | Qty: 6 | Fill #2

## 2020-04-06 ENCOUNTER — Other Ambulatory Visit: Payer: Self-pay | Admitting: Internal Medicine

## 2020-04-06 DIAGNOSIS — R1310 Dysphagia, unspecified: Secondary | ICD-10-CM

## 2020-04-07 ENCOUNTER — Ambulatory Visit (INDEPENDENT_AMBULATORY_CARE_PROVIDER_SITE_OTHER): Payer: No Typology Code available for payment source | Admitting: Orthopedic Surgery

## 2020-04-07 ENCOUNTER — Encounter: Payer: Self-pay | Admitting: Orthopedic Surgery

## 2020-04-07 ENCOUNTER — Ambulatory Visit (INDEPENDENT_AMBULATORY_CARE_PROVIDER_SITE_OTHER): Payer: No Typology Code available for payment source

## 2020-04-07 DIAGNOSIS — M25511 Pain in right shoulder: Secondary | ICD-10-CM | POA: Diagnosis not present

## 2020-04-07 DIAGNOSIS — G8929 Other chronic pain: Secondary | ICD-10-CM

## 2020-04-07 NOTE — Progress Notes (Signed)
Office Visit Note   Patient: Heather Stout           Date of Birth: [birth year]           MRN: [id number] Visit Date: 04/07/2020 Requested by: Deland Pretty, MD [address] [place],  [place] [address] PCP: Deland Pretty, MD  Subjective: Chief Complaint  Patient presents with  . Right Shoulder - Pain    HPI: Heather Stout is a 64 year old patient with right shoulder pain. Is been going on for a year. Has worsened over the last months. She does report history of bilateral proximal biceps tendon ruptures which hurt for several weeks and then improved. She describes good range of motion but sometimes she has pain within the last week which radiates down the dorsal hand on the ulnar side. Has some right-sided neck pain as well. Hard for her to sleep on the right-hand side. Hard for her to lift heavy objects.              ROS: All systems reviewed are negative as they relate to the chief complaint within the history of present illness.  Patient denies  fevers or chills.   Assessment & Plan: Visit Diagnoses:  1. Chronic right shoulder pain     Plan: Impression is right shoulder pain with proximal biceps rupture bilaterally. Rotator cuff strength is good. She does have some coarse grinding on exam on the right which is not present on the left. No restriction of passive external rotation. Impression is possible small rotator cuff tear versus biceps tendon remnant giving her some symptoms. She has pain when she pushes herself up out of a chair. Plan is MRI arthrogram to evaluate for small rotator cuff tear versus biceps remnant in the superior glenoid region. Follow-up after that study.  Follow-Up Instructions: Return for after MRI.   Orders:  Orders Placed This Encounter  Procedures  . XR Shoulder Right  . DL FLUORO GUIDED NEEDLE PLC ASPIRATION / INJECTTION/LOC  . MR SHOULDER RIGHT W CONTRAST   No orders of the defined types were placed in this encounter.      Procedures: No procedures performed   Clinical Data: No additional findings.  Objective: Vital Signs: There were no vitals taken for this visit.  Physical Exam:   Constitutional: Patient appears well-developed HEENT:  Head: Normocephalic Eyes:EOM are normal Neck: Normal range of motion Cardiovascular: Normal rate Pulmonary/chest: Effort normal Neurologic: Patient is alert Skin: Skin is warm Psychiatric: Patient has normal mood and affect    Ortho Exam: Ortho exam demonstrates full active and passive range of motion of both shoulders. Popeye deformity is present but it is not really prominent. She has excellent rotator cuff strength infraspinatus supraspinatus and subscap muscle testing. No masses lymphadenopathy or skin changes noted in that shoulder region. No AC joint tenderness bilaterally. Mild grinding on the right with internal extra rotation at 90 degrees of abduction.  Specialty Comments:  No specialty comments available.  Imaging: XR Shoulder Right  Result Date: 04/07/2020 AP outlet axillary right shoulder reviewed. Acromiohumeral distance maintained. Minimal degenerative change in the [facility] joint no degenerative changes in the glenohumeral joint. Shoulder is located. No acute fracture. Visualized lung fields intact.    PMFS History: Patient Active Problem List   Diagnosis Date Noted  . Essential hypertension 04/04/2015  . Hyperlipemia 04/04/2015  . Irritable bowel syndrome 04/04/2015  . Diabetes ([place]) 11/29/2014   Past Medical History:  Diagnosis Date  . Asthma   . Essential  hypertension, benign   . Left anterior hemiblock   . Other and unspecified hyperlipidemia   . Type II or unspecified type diabetes mellitus without mention of complication, not stated as uncontrolled     Family History  Problem Relation Age of Onset  . Hypertension Father   . Hypertension Brother   . Diabetes Brother   . Hypertension Brother   . Crohn's disease Brother   .  Hypertension Sister   . Diabetes Sister     Past Surgical History:  Procedure Laterality Date  . FUNCTIONAL ENDOSCOPIC SINUS SURGERY  1990  . LUMBAR LAMINECTOMY  2002  . LUMBAR LAMINECTOMY  2005  . TONSILLECTOMY  1980   Social History   Occupational History  . Occupation: Insurance underwriter  Tobacco Use  . Smoking status: Never Smoker  . Smokeless tobacco: Never Used  Substance and Sexual Activity  . Alcohol use: No    Alcohol/week: 0.0 standard drinks  . Drug use: No  . Sexual activity: Not on file

## 2020-04-13 ENCOUNTER — Ambulatory Visit
Admission: RE | Admit: 2020-04-13 | Discharge: 2020-04-13 | Disposition: A | Discharge status: Home / Self Care | Payer: No Typology Code available for payment source | Source: Ambulatory Visit | Attending: Internal Medicine | Admitting: Internal Medicine

## 2020-04-13 DIAGNOSIS — R1310 Dysphagia, unspecified: Secondary | ICD-10-CM

## 2020-04-17 MED FILL — OMEPRAZOLE 40 MG CPDR: 40 | 30 days supply | Qty: 60 | Fill #2

## 2020-04-17 MED FILL — OSCIMIN SR 0.375 MG TABLET: 0.375 | 30 days supply | Qty: 60 | Fill #0

## 2020-04-18 ENCOUNTER — Other Ambulatory Visit (HOSPITAL_COMMUNITY): Payer: Self-pay | Admitting: Internal Medicine

## 2020-04-18 MED FILL — FREESTYLE LIBRE 14 DAY SENS: 28 days supply | Qty: 2 | Fill #0

## 2020-04-18 MED FILL — OZEMPIC (1 MG/DOSE) 4 MG/3M: 4 | 28 days supply | Qty: 3 | Fill #0

## 2020-04-19 ENCOUNTER — Other Ambulatory Visit: Payer: Self-pay | Admitting: Orthopedic Surgery

## 2020-04-21 ENCOUNTER — Other Ambulatory Visit: Payer: Self-pay

## 2020-04-21 ENCOUNTER — Ambulatory Visit
Admission: RE | Admit: 2020-04-21 | Discharge: 2020-04-21 | Disposition: A | Discharge status: Home / Self Care | Payer: No Typology Code available for payment source | Source: Ambulatory Visit | Attending: Orthopedic Surgery | Admitting: Orthopedic Surgery

## 2020-04-21 DIAGNOSIS — M25511 Pain in right shoulder: Secondary | ICD-10-CM

## 2020-04-21 DIAGNOSIS — G8929 Other chronic pain: Secondary | ICD-10-CM

## 2020-04-21 MED ORDER — IOPAMIDOL (ISOVUE-M 200) INJECTION 41%
20.0000 mL | Freq: Once | INTRAMUSCULAR | Status: AC
  Administered 2020-04-21: 20 mL via INTRA_ARTICULAR

## 2020-04-25 MED FILL — VIBERZI 100 MG TABS: 100 | 90 days supply | Qty: 180 | Fill #0

## 2020-04-26 ENCOUNTER — Ambulatory Visit (INDEPENDENT_AMBULATORY_CARE_PROVIDER_SITE_OTHER): Payer: No Typology Code available for payment source | Admitting: Orthopedic Surgery

## 2020-04-26 DIAGNOSIS — M75121 Complete rotator cuff tear or rupture of right shoulder, not specified as traumatic: Secondary | ICD-10-CM

## 2020-04-30 ENCOUNTER — Encounter: Payer: Self-pay | Admitting: Orthopedic Surgery

## 2020-04-30 NOTE — Progress Notes (Signed)
   Post-Op Visit Note   Patient: Heather Stout           Date of Birth: 14-Jan-1956           MRN: [id number] Visit Date: 04/26/2020 PCP: Deland Pretty, MD   Assessment & Plan:  Chief Complaint:  Chief Complaint  Patient presents with  . review MRI scan   Visit Diagnoses:  1. Nontraumatic complete tear of right rotator cuff     Plan: Talked with [place] today about her MRI scan.  She has rotator cuff tear.  She also has biceps tendon rupture.  She is having pretty significant pain.  No symptoms of frozen shoulder yet.  We discussed operative and nonoperative options.  Plan at this time is surgical intervention for rotator cuff repair.  May also need debridement of that superior labrum where the biceps is ruptured.  Risk and benefits are discussed include not limited to infection nerve vessel damage shoulder stiffness as well as potential need for more surgery.  I think that she would be somewhat functional for typing after 1 to 2 weeks but we would not have her do any lifting for at least 6 to 8 weeks after surgery.  Patient is diabetic which increases her risk of complications.  Patient understands and wishes to proceed with surgical intervention.  All questions answered  Follow-Up Instructions: No follow-ups on file.   Orders:  No orders of the defined types were placed in this encounter.  No orders of the defined types were placed in this encounter.   Imaging: No results found.  PMFS History: Patient Active Problem List   Diagnosis Date Noted  . Essential hypertension 04/04/2015  . Hyperlipemia 04/04/2015  . Irritable bowel syndrome 04/04/2015  . Diabetes ([place]) 11/29/2014   Past Medical History:  Diagnosis Date  . Asthma   . Essential hypertension, benign   . Left anterior hemiblock   . Other and unspecified hyperlipidemia   . Type II or unspecified type diabetes mellitus without mention of complication, not stated as uncontrolled     Family History  Problem  Relation Age of Onset  . Hypertension Father   . Hypertension Brother   . Diabetes Brother   . Hypertension Brother   . Crohn's disease Brother   . Hypertension Sister   . Diabetes Sister     Past Surgical History:  Procedure Laterality Date  . FUNCTIONAL ENDOSCOPIC SINUS SURGERY  1990  . LUMBAR LAMINECTOMY  2002  . LUMBAR LAMINECTOMY  2005  . TONSILLECTOMY  1980   Social History   Occupational History  . Occupation: Insurance underwriter  Tobacco Use  . Smoking status: Never Smoker  . Smokeless tobacco: Never Used  Substance and Sexual Activity  . Alcohol use: No    Alcohol/week: 0.0 standard drinks  . Drug use: No  . Sexual activity: Not on file

## 2020-05-03 MED FILL — XIGDUO XR 10-500 MG TB24: 10-500 | 90 days supply | Qty: 90 | Fill #0

## 2020-05-08 ENCOUNTER — Other Ambulatory Visit: Payer: Self-pay

## 2020-05-09 MED FILL — TRESIBA FLEXTOUCH 200 UNITS: 200 | 30 days supply | Qty: 6 | Fill #3

## 2020-05-10 ENCOUNTER — Encounter (HOSPITAL_BASED_OUTPATIENT_CLINIC_OR_DEPARTMENT_OTHER): Payer: Self-pay | Admitting: Orthopedic Surgery

## 2020-05-10 ENCOUNTER — Other Ambulatory Visit: Payer: Self-pay

## 2020-05-11 ENCOUNTER — Other Ambulatory Visit: Payer: Self-pay | Admitting: Surgical

## 2020-05-11 ENCOUNTER — Other Ambulatory Visit (HOSPITAL_COMMUNITY): Payer: No Typology Code available for payment source

## 2020-05-11 MED ORDER — OXYCODONE-ACETAMINOPHEN 5-325 MG PO TABS: 1.0000 | ORAL_TABLET | ORAL | 0 refills | Status: DC | PRN

## 2020-05-11 MED ORDER — METHOCARBAMOL 500 MG PO TABS: 500.0000 mg | ORAL_TABLET | Freq: Three times a day (TID) | ORAL | 0 refills | Status: DC | PRN

## 2020-05-11 MED ORDER — CELECOXIB 100 MG PO CAPS: 100.0000 mg | ORAL_CAPSULE | Freq: Two times a day (BID) | ORAL | 0 refills | Status: DC

## 2020-05-11 MED FILL — OXYCODONE-APAP 5-325MG: 5-325 | 7 days supply | Qty: 30 | Fill #0

## 2020-05-11 MED FILL — METHOCARBAMOL 500 MG TABS: 500 | 10 days supply | Qty: 30 | Fill #0

## 2020-05-11 MED FILL — OZEMPIC (1 MG/DOSE) 4 MG/3M: 4 | 28 days supply | Qty: 3 | Fill #1

## 2020-05-11 MED FILL — CELECOXIB 100 MG CAP: 100 | 30 days supply | Qty: 60 | Fill #0

## 2020-05-12 ENCOUNTER — Other Ambulatory Visit (HOSPITAL_COMMUNITY)
Admission: RE | Admit: 2020-05-12 | Discharge: 2020-05-12 | Disposition: A | Discharge status: Home / Self Care | Payer: No Typology Code available for payment source | Source: Ambulatory Visit | Attending: Orthopedic Surgery | Admitting: Orthopedic Surgery

## 2020-05-12 ENCOUNTER — Encounter (HOSPITAL_BASED_OUTPATIENT_CLINIC_OR_DEPARTMENT_OTHER)
Admission: RE | Admit: 2020-05-12 | Discharge: 2020-05-12 | Disposition: A | Discharge status: Home / Self Care | Payer: No Typology Code available for payment source | Source: Ambulatory Visit | Attending: Orthopedic Surgery | Admitting: Orthopedic Surgery

## 2020-05-12 DIAGNOSIS — Z01810 Encounter for preprocedural cardiovascular examination: Secondary | ICD-10-CM | POA: Insufficient documentation

## 2020-05-12 DIAGNOSIS — Z01812 Encounter for preprocedural laboratory examination: Secondary | ICD-10-CM | POA: Insufficient documentation

## 2020-05-12 DIAGNOSIS — Z20822 Contact with and (suspected) exposure to covid-19: Secondary | ICD-10-CM | POA: Insufficient documentation

## 2020-05-12 LAB — BASIC METABOLIC PANEL
Anion gap: 13 (ref 5–15)
BUN: 28 mg/dL — ABNORMAL HIGH (ref 8–23)
CO2: 25 mmol/L (ref 22–32)
Calcium: 10.3 mg/dL (ref 8.9–10.3)
Chloride: 103 mmol/L (ref 98–111)
Creatinine, Ser: 1.08 mg/dL — ABNORMAL HIGH (ref 0.44–1.00)
GFR calc Af Amer: >60 mL/min (ref >60)
GFR calc non Af Amer: 54 mL/min — ABNORMAL LOW (ref >60)
Glucose, Bld: 153 mg/dL — ABNORMAL HIGH (ref 70–99)
Potassium: 3.8 mmol/L (ref 3.5–5.1)
Sodium: 141 mmol/L (ref 135–145)

## 2020-05-12 LAB — CBC
HCT: 43.8 % (ref 36.0–46.0)
Hemoglobin: 13.7 g/dL (ref 12.0–15.0)
MCH: 26.4 pg (ref 26.0–34.0)
MCHC: 31.3 g/dL (ref 30.0–36.0)
MCV: 84.6 fL (ref 80.0–100.0)
Platelets: 249 10*3/uL (ref 150–400)
RBC: 5.18 MIL/uL — ABNORMAL HIGH (ref 3.87–5.11)
RDW: 13.7 % (ref 11.5–15.5)
WBC: 5.8 10*3/uL (ref 4.0–10.5)
nRBC: 0 % (ref 0.0–0.2)

## 2020-05-12 NOTE — Progress Notes (Signed)

## 2020-05-13 LAB — SARS CORONAVIRUS 2 (TAT 6-24 HRS): SARS Coronavirus 2: NEGATIVE

## 2020-05-14 NOTE — Anesthesia Preprocedure Evaluation (Addendum)
Anesthesia Evaluation  Patient identified by MRN, date of birth, ID band Patient awake    Reviewed: Allergy & Precautions, NPO status , Patient's Chart, lab work & pertinent test results  Airway Mallampati: II  TM Distance: >3 FB Neck ROM: Full    Dental no notable dental hx. (+) Teeth Intact, Dental Advisory Given   Pulmonary asthma , sleep apnea ,    Pulmonary exam normal breath sounds clear to auscultation       Cardiovascular hypertension, Pt. on medications negative cardio ROS Normal cardiovascular exam Rhythm:Regular Rate:Normal  9/17 EKG SR R 71 w LAFB + NSST   Neuro/Psych negative neurological ROS  negative psych ROS   GI/Hepatic Neg liver ROS, GERD  ,  Endo/Other  diabetes, Well Controlled, Type 2, Oral Hypoglycemic Agents  Renal/GU negative Renal ROSK+ 3.8 Cr 1.08     Musculoskeletal negative musculoskeletal ROS (+)   Abdominal   Peds negative pediatric ROS (+)  Hematology negative hematology ROS (+) Hgb 13.7   Anesthesia Other Findings   Reproductive/Obstetrics                            Anesthesia Physical Anesthesia Plan  ASA: III  Anesthesia Plan: General   Post-op Pain Management:  Regional for Post-op pain   Induction: Intravenous  PONV Risk Score and Plan: Treatment may vary due to age or medical condition, Ondansetron and Dexamethasone  Airway Management Planned: Oral ETT  Additional Equipment: None  Intra-op Plan:   Post-operative Plan: Extubation in OR  Informed Consent: I have reviewed the patients History and Physical, chart, labs and discussed the procedure including the risks, benefits and alternatives for the proposed anesthesia with the patient or authorized representative who has indicated his/her understanding and acceptance.     Dental advisory given  Plan Discussed with: CRNA  Anesthesia Plan Comments: (GA w R ISB w Exparel)        Anesthesia Quick Evaluation

## 2020-05-15 ENCOUNTER — Encounter: Payer: Self-pay | Admitting: Orthopedic Surgery

## 2020-05-15 ENCOUNTER — Encounter (HOSPITAL_BASED_OUTPATIENT_CLINIC_OR_DEPARTMENT_OTHER): Payer: Self-pay | Admitting: Orthopedic Surgery

## 2020-05-15 ENCOUNTER — Ambulatory Visit (HOSPITAL_BASED_OUTPATIENT_CLINIC_OR_DEPARTMENT_OTHER)
Admission: RE | Admit: 2020-05-15 | Discharge: 2020-05-15 | Disposition: A | Discharge status: Home / Self Care | Payer: No Typology Code available for payment source | Attending: Orthopedic Surgery | Admitting: Orthopedic Surgery

## 2020-05-15 ENCOUNTER — Other Ambulatory Visit: Payer: Self-pay

## 2020-05-15 ENCOUNTER — Ambulatory Visit (HOSPITAL_BASED_OUTPATIENT_CLINIC_OR_DEPARTMENT_OTHER): Payer: No Typology Code available for payment source | Admitting: Anesthesiology

## 2020-05-15 ENCOUNTER — Encounter (HOSPITAL_BASED_OUTPATIENT_CLINIC_OR_DEPARTMENT_OTHER)
Admission: RE | Disposition: A | Discharge status: Home / Self Care | Payer: Self-pay | Source: Home / Self Care | Attending: Orthopedic Surgery

## 2020-05-15 DIAGNOSIS — K219 Gastro-esophageal reflux disease without esophagitis: Secondary | ICD-10-CM | POA: Insufficient documentation

## 2020-05-15 DIAGNOSIS — E119 Type 2 diabetes mellitus without complications: Secondary | ICD-10-CM | POA: Diagnosis not present

## 2020-05-15 DIAGNOSIS — K589 Irritable bowel syndrome without diarrhea: Secondary | ICD-10-CM | POA: Diagnosis not present

## 2020-05-15 DIAGNOSIS — Z7982 Long term (current) use of aspirin: Secondary | ICD-10-CM | POA: Insufficient documentation

## 2020-05-15 DIAGNOSIS — M75121 Complete rotator cuff tear or rupture of right shoulder, not specified as traumatic: Secondary | ICD-10-CM

## 2020-05-15 DIAGNOSIS — Z791 Long term (current) use of non-steroidal anti-inflammatories (NSAID): Secondary | ICD-10-CM | POA: Insufficient documentation

## 2020-05-15 DIAGNOSIS — G4730 Sleep apnea, unspecified: Secondary | ICD-10-CM | POA: Insufficient documentation

## 2020-05-15 DIAGNOSIS — Z79899 Other long term (current) drug therapy: Secondary | ICD-10-CM | POA: Diagnosis not present

## 2020-05-15 DIAGNOSIS — S43431A Superior glenoid labrum lesion of right shoulder, initial encounter: Secondary | ICD-10-CM | POA: Insufficient documentation

## 2020-05-15 DIAGNOSIS — M24111 Other articular cartilage disorders, right shoulder: Secondary | ICD-10-CM

## 2020-05-15 DIAGNOSIS — M75111 Incomplete rotator cuff tear or rupture of right shoulder, not specified as traumatic: Secondary | ICD-10-CM | POA: Insufficient documentation

## 2020-05-15 DIAGNOSIS — Z794 Long term (current) use of insulin: Secondary | ICD-10-CM | POA: Insufficient documentation

## 2020-05-15 DIAGNOSIS — I10 Essential (primary) hypertension: Secondary | ICD-10-CM | POA: Insufficient documentation

## 2020-05-15 DIAGNOSIS — J45909 Unspecified asthma, uncomplicated: Secondary | ICD-10-CM | POA: Diagnosis not present

## 2020-05-15 DIAGNOSIS — X58XXXA Exposure to other specified factors, initial encounter: Secondary | ICD-10-CM | POA: Diagnosis not present

## 2020-05-15 DIAGNOSIS — E785 Hyperlipidemia, unspecified: Secondary | ICD-10-CM | POA: Diagnosis not present

## 2020-05-15 HISTORY — DX: Sleep apnea, unspecified: G47.30

## 2020-05-15 HISTORY — PX: SHOULDER ARTHROSCOPY WITH OPEN ROTATOR CUFF REPAIR: SHX6092

## 2020-05-15 HISTORY — DX: Gastro-esophageal reflux disease without esophagitis: K21.9

## 2020-05-15 HISTORY — DX: Irritable bowel syndrome, unspecified: K58.9

## 2020-05-15 LAB — GLUCOSE, CAPILLARY
Glucose-Capillary: 132 mg/dL — ABNORMAL HIGH (ref 70–99)
Glucose-Capillary: 119 mg/dL — ABNORMAL HIGH (ref 70–99)

## 2020-05-15 SURGERY — ARTHROSCOPY, SHOULDER WITH REPAIR, ROTATOR CUFF, OPEN
Anesthesia: General | Site: Shoulder | Laterality: Right

## 2020-05-15 MED ORDER — ONDANSETRON HCL 4 MG/2ML IJ SOLN
INTRAMUSCULAR | Status: DC | PRN
  Administered 2020-05-15: 4 mg via INTRAVENOUS

## 2020-05-15 MED ORDER — EPHEDRINE 5 MG/ML INJ
INTRAVENOUS | Status: AC
  Filled 2020-05-15: qty 10

## 2020-05-15 MED ORDER — SUGAMMADEX SODIUM 200 MG/2ML IV SOLN
INTRAVENOUS | Status: DC | PRN
  Administered 2020-05-15: 200 mg via INTRAVENOUS

## 2020-05-15 MED ORDER — AMISULPRIDE (ANTIEMETIC) 5 MG/2ML IV SOLN: 10.0000 mg | Freq: Once | INTRAVENOUS | Status: DC | PRN

## 2020-05-15 MED ORDER — FENTANYL CITRATE (PF) 100 MCG/2ML IJ SOLN
INTRAMUSCULAR | Status: AC
  Filled 2020-05-15: qty 2

## 2020-05-15 MED ORDER — FENTANYL CITRATE (PF) 100 MCG/2ML IJ SOLN
100.0000 ug | Freq: Once | INTRAMUSCULAR | Status: AC
  Administered 2020-05-15: 100 ug via INTRAVENOUS

## 2020-05-15 MED ORDER — ONDANSETRON HCL 4 MG/2ML IJ SOLN
INTRAMUSCULAR | Status: AC
  Filled 2020-05-15: qty 2

## 2020-05-15 MED ORDER — ONDANSETRON HCL 4 MG/2ML IJ SOLN: 4.0000 mg | Freq: Once | INTRAMUSCULAR | Status: DC | PRN

## 2020-05-15 MED ORDER — MIDAZOLAM HCL 2 MG/2ML IJ SOLN
INTRAMUSCULAR | Status: DC | PRN
  Administered 2020-05-15: 1 mg via INTRAVENOUS

## 2020-05-15 MED ORDER — EPINEPHRINE PF 1 MG/ML IJ SOLN
INTRAMUSCULAR | Status: AC
  Filled 2020-05-15: qty 1

## 2020-05-15 MED ORDER — ACETAMINOPHEN 10 MG/ML IV SOLN: 1000.0000 mg | Freq: Once | INTRAVENOUS | Status: DC | PRN

## 2020-05-15 MED ORDER — POVIDONE-IODINE 10 % EX SWAB: 2.0000 "application " | Freq: Once | CUTANEOUS | Status: DC

## 2020-05-15 MED ORDER — LIDOCAINE HCL (CARDIAC) PF 100 MG/5ML IV SOSY
PREFILLED_SYRINGE | INTRAVENOUS | Status: DC | PRN
  Administered 2020-05-15: 100 mg via INTRAVENOUS

## 2020-05-15 MED ORDER — BUPIVACAINE HCL (PF) 0.5 % IJ SOLN
INTRAMUSCULAR | Status: AC
  Filled 2020-05-15: qty 30

## 2020-05-15 MED ORDER — MIDAZOLAM HCL 2 MG/2ML IJ SOLN
INTRAMUSCULAR | Status: AC
  Filled 2020-05-15: qty 2

## 2020-05-15 MED ORDER — EPHEDRINE SULFATE 50 MG/ML IJ SOLN
INTRAMUSCULAR | Status: DC | PRN
  Administered 2020-05-15: 10 mg via INTRAVENOUS
  Administered 2020-05-15 (×5): 5 mg via INTRAVENOUS

## 2020-05-15 MED ORDER — LACTATED RINGERS IV SOLN: INTRAVENOUS | Status: DC

## 2020-05-15 MED ORDER — PHENYLEPHRINE 40 MCG/ML (10ML) SYRINGE FOR IV PUSH (FOR BLOOD PRESSURE SUPPORT)
PREFILLED_SYRINGE | INTRAVENOUS | Status: AC
  Filled 2020-05-15: qty 10

## 2020-05-15 MED ORDER — BUPIVACAINE LIPOSOME 1.3 % IJ SUSP
INTRAMUSCULAR | Status: DC | PRN
  Administered 2020-05-15: 10 mL via PERINEURAL

## 2020-05-15 MED ORDER — PROPOFOL 10 MG/ML IV BOLUS
INTRAVENOUS | Status: DC | PRN
  Administered 2020-05-15: 150 mg via INTRAVENOUS

## 2020-05-15 MED ORDER — CEFAZOLIN SODIUM-DEXTROSE 2-4 GM/100ML-% IV SOLN
INTRAVENOUS | Status: AC
  Filled 2020-05-15: qty 100

## 2020-05-15 MED ORDER — PHENYLEPHRINE HCL (PRESSORS) 10 MG/ML IV SOLN
INTRAVENOUS | Status: DC | PRN
  Administered 2020-05-15 (×3): 80 ug via INTRAVENOUS
  Administered 2020-05-15: 40 ug via INTRAVENOUS
  Administered 2020-05-15: 80 ug via INTRAVENOUS

## 2020-05-15 MED ORDER — POVIDONE-IODINE 7.5 % EX SOLN
Freq: Once | CUTANEOUS | Status: DC
  Filled 2020-05-15: qty 118

## 2020-05-15 MED ORDER — MIDAZOLAM HCL 2 MG/2ML IJ SOLN
2.0000 mg | Freq: Once | INTRAMUSCULAR | Status: AC
  Administered 2020-05-15: 2 mg via INTRAVENOUS

## 2020-05-15 MED ORDER — FENTANYL CITRATE (PF) 100 MCG/2ML IJ SOLN
INTRAMUSCULAR | Status: DC | PRN
Start: 2020-05-15 — End: 2020-05-15
  Administered 2020-05-15 (×2): 50 ug via INTRAVENOUS

## 2020-05-15 MED ORDER — BUPIVACAINE-EPINEPHRINE (PF) 0.5% -1:200000 IJ SOLN
INTRAMUSCULAR | Status: DC | PRN
  Administered 2020-05-15: 16 mL via PERINEURAL

## 2020-05-15 MED ORDER — ROCURONIUM BROMIDE 10 MG/ML (PF) SYRINGE
PREFILLED_SYRINGE | INTRAVENOUS | Status: AC
  Filled 2020-05-15: qty 10

## 2020-05-15 MED ORDER — FENTANYL CITRATE (PF) 100 MCG/2ML IJ SOLN: 25.0000 ug | INTRAMUSCULAR | Status: DC | PRN

## 2020-05-15 MED ORDER — ROCURONIUM BROMIDE 100 MG/10ML IV SOLN
INTRAVENOUS | Status: DC | PRN
  Administered 2020-05-15: 60 mg via INTRAVENOUS

## 2020-05-15 MED ORDER — CEFAZOLIN SODIUM-DEXTROSE 2-4 GM/100ML-% IV SOLN
2.0000 g | INTRAVENOUS | Status: AC
  Administered 2020-05-15: 2 g via INTRAVENOUS

## 2020-05-15 MED ORDER — LIDOCAINE 2% (20 MG/ML) 5 ML SYRINGE
INTRAMUSCULAR | Status: AC
  Filled 2020-05-15: qty 5

## 2020-05-15 MED ORDER — PROPOFOL 10 MG/ML IV BOLUS
INTRAVENOUS | Status: AC
  Filled 2020-05-15: qty 20

## 2020-05-15 SURGICAL SUPPLY — 88 items
ANCHOR FBRTK 2.6 SUTURETAP 1.3 (Anchor) ×4 IMPLANT
ANCHOR SL BIO 4.75 W/TIGERTAPE (Anchor) ×4 IMPLANT
BLADE EXCALIBUR 4.0X13 (MISCELLANEOUS) IMPLANT
BLADE SHAVER BONE 5.0X13 (MISCELLANEOUS) IMPLANT
BLADE SURG 10 STRL SS (BLADE) IMPLANT
BLADE SURG 15 STRL LF DISP TIS (BLADE) IMPLANT
BLADE SURG 15 STRL SS (BLADE)
BURR OVAL 8 FLU 5.0X13 (MISCELLANEOUS) IMPLANT
CANNULA 5.75X71 LONG (CANNULA) IMPLANT
CANNULA TWIST IN 8.25X7CM (CANNULA) IMPLANT
COVER WAND RF STERILE (DRAPES) IMPLANT
DECANTER SPIKE VIAL GLASS SM (MISCELLANEOUS) IMPLANT
DISSECTOR  3.8MM X 13CM (MISCELLANEOUS) ×2
DISSECTOR 3.8MM X 13CM (MISCELLANEOUS) ×1 IMPLANT
DISSECTOR 4.0MM X 13CM (MISCELLANEOUS) ×2 IMPLANT
DRAPE IMP U-DRAPE 54X76 (DRAPES) ×2 IMPLANT
DRAPE INCISE IOBAN 66X45 STRL (DRAPES) ×4 IMPLANT
DRAPE STERI 35X30 U-POUCH (DRAPES) ×2 IMPLANT
DRAPE U-SHAPE 47X51 STRL (DRAPES) ×4 IMPLANT
DRAPE U-SHAPE 76X120 STRL (DRAPES) ×4 IMPLANT
DRSG AQUACEL AG ADV 3.5X 6 (GAUZE/BANDAGES/DRESSINGS) IMPLANT
DURAPREP 26ML APPLICATOR (WOUND CARE) ×2 IMPLANT
DW OUTFLOW CASSETTE/TUBE SET (MISCELLANEOUS) ×2 IMPLANT
ELECT REM PT RETURN 9FT ADLT (ELECTROSURGICAL) ×2
ELECTRODE REM PT RTRN 9FT ADLT (ELECTROSURGICAL) ×1 IMPLANT
EXCALIBUR 3.8MM X 13CM (MISCELLANEOUS) IMPLANT
GAUZE SPONGE 4X4 12PLY STRL (GAUZE/BANDAGES/DRESSINGS) ×2 IMPLANT
GAUZE XEROFORM 1X8 LF (GAUZE/BANDAGES/DRESSINGS) ×2 IMPLANT
GLOVE BIO SURGEON STRL SZ 6.5 (GLOVE) ×2 IMPLANT
GLOVE BIO SURGEON STRL SZ7 (GLOVE) ×2 IMPLANT
GLOVE BIOGEL PI IND STRL 6.5 (GLOVE) ×2 IMPLANT
GLOVE BIOGEL PI IND STRL 7.0 (GLOVE) ×1 IMPLANT
GLOVE BIOGEL PI IND STRL 8 (GLOVE) ×1 IMPLANT
GLOVE BIOGEL PI INDICATOR 6.5 (GLOVE) ×2
GLOVE BIOGEL PI INDICATOR 7.0 (GLOVE) ×1
GLOVE BIOGEL PI INDICATOR 8 (GLOVE) ×1
GLOVE SURG ORTHO 8.0 STRL STRW (GLOVE) ×2 IMPLANT
GOWN STRL REUS W/ TWL LRG LVL3 (GOWN DISPOSABLE) ×2 IMPLANT
GOWN STRL REUS W/ TWL XL LVL3 (GOWN DISPOSABLE) ×1 IMPLANT
GOWN STRL REUS W/TWL LRG LVL3 (GOWN DISPOSABLE) ×4
GOWN STRL REUS W/TWL XL LVL3 (GOWN DISPOSABLE) ×2
KIT BIO-TENODESIS 3X8 DISP (MISCELLANEOUS)
KIT INSRT BABSR STRL DISP BTN (MISCELLANEOUS) IMPLANT
KIT STR SPEAR 1.8 FBRTK DISP (KITS) ×2 IMPLANT
LOOP 2 FIBERLINK CLOSED (SUTURE) ×2 IMPLANT
MANIFOLD NEPTUNE II (INSTRUMENTS) ×2 IMPLANT
NDL SAFETY ECLIPSE 18X1.5 (NEEDLE) ×1 IMPLANT
NDL SUT 6 .5 CRC .975X.05 MAYO (NEEDLE) ×1 IMPLANT
NEEDLE HYPO 18GX1.5 SHARP (NEEDLE) ×2
NEEDLE MAYO TAPER (NEEDLE) ×2
NEEDLE SCORPION MULTI FIRE (NEEDLE) ×2 IMPLANT
NEEDLE SPNL 18GX3.5 QUINCKE PK (NEEDLE) IMPLANT
NS IRRIG 1000ML POUR BTL (IV SOLUTION) IMPLANT
PACK ARTHROSCOPY DSU (CUSTOM PROCEDURE TRAY) ×2 IMPLANT
PACK BASIN DAY SURGERY FS (CUSTOM PROCEDURE TRAY) ×2 IMPLANT
PENCIL SMOKE EVACUATOR (MISCELLANEOUS) ×2 IMPLANT
PORT APPOLLO RF 90DEGREE MULTI (SURGICAL WAND) ×2 IMPLANT
RESTRAINT HEAD UNIVERSAL NS (MISCELLANEOUS) ×2 IMPLANT
SHEET MEDIUM DRAPE 40X70 STRL (DRAPES) IMPLANT
SLEEVE SCD COMPRESS KNEE MED (MISCELLANEOUS) ×2 IMPLANT
SLING ARM FOAM STRAP LRG (SOFTGOODS) IMPLANT
SLING ARM IMMOBILIZER LRG (SOFTGOODS) ×2 IMPLANT
SPONGE LAP 4X18 RFD (DISPOSABLE) ×2 IMPLANT
SPONGE SURGIFOAM ABS GEL 12-7 (HEMOSTASIS) IMPLANT
SUCTION FRAZIER HANDLE 10FR (MISCELLANEOUS) ×2
SUCTION TUBE FRAZIER 10FR DISP (MISCELLANEOUS) ×1 IMPLANT
SUT BONE WAX W31G (SUTURE) IMPLANT
SUT ETHIBOND 2 OS 4 DA (SUTURE) IMPLANT
SUT ETHILON 3 0 PS 1 (SUTURE) ×2 IMPLANT
SUT FIBERWIRE #2 38 T-5 BLUE (SUTURE)
SUT FIBERWIRE 2-0 18 17.9 3/8 (SUTURE) ×6
SUT MNCRL AB 3-0 PS2 18 (SUTURE) ×2 IMPLANT
SUT PDS AB 0 CT 36 (SUTURE) IMPLANT
SUT TICRON 1 T 12 (SUTURE) IMPLANT
SUT VIC AB 1 CT1 27 (SUTURE)
SUT VIC AB 1 CT1 27XBRD ANBCTR (SUTURE) IMPLANT
SUT VIC AB 2-0 SH 27 (SUTURE)
SUT VIC AB 2-0 SH 27XBRD (SUTURE) IMPLANT
SUT VICRYL 0 UR6 27IN ABS (SUTURE) ×8 IMPLANT
SUT VICRYL 4-0 PS2 18IN ABS (SUTURE) ×2 IMPLANT
SUTURE FIBERWR #2 38 T-5 BLUE (SUTURE) IMPLANT
SUTURE FIBERWR 2-0 18 17.9 3/8 (SUTURE) ×3 IMPLANT
SYR 5ML LL (SYRINGE) IMPLANT
TOWEL GREEN STERILE FF (TOWEL DISPOSABLE) ×2 IMPLANT
TRAY DSU PREP LF (CUSTOM PROCEDURE TRAY) ×2 IMPLANT
TUBE CONNECTING 20X1/4 (TUBING) ×2 IMPLANT
TUBING ARTHROSCOPY IRRIG 16FT (MISCELLANEOUS) ×2 IMPLANT
YANKAUER SUCT BULB TIP NO VENT (SUCTIONS) ×2 IMPLANT

## 2020-05-15 NOTE — H&P (Signed)
Heather Stout is an 64 y.o. female.   Chief Complaint: Right shoulder pain HPI: Heather Stout is a 64 year old patient with right shoulder pain.  Has history of right shoulder pain for several months.  It has been interfering with her activities of daily living as well as sleep.  MRI scan shows rotator cuff tear with mild retraction but no atrophy of the muscle of the supraspinatus.  Subscap intact.  She presents now for operative management after explanation of risks and benefits.  Past Medical History:  Diagnosis Date  . Asthma   . Essential hypertension, benign   . GERD (gastroesophageal reflux disease)   . IBS (irritable bowel syndrome)   . Left anterior hemiblock   . Other and unspecified hyperlipidemia   . Sleep apnea    cpap  . Type II or unspecified type diabetes mellitus without mention of complication, not stated as uncontrolled     Past Surgical History:  Procedure Laterality Date  . FUNCTIONAL ENDOSCOPIC SINUS SURGERY  1990  . LUMBAR LAMINECTOMY  2002  . LUMBAR LAMINECTOMY  2005  . TONSILLECTOMY  1980    Family History  Problem Relation Age of Onset  . Hypertension Father   . Hypertension Brother   . Diabetes Brother   . Hypertension Brother   . Crohn's disease Brother   . Hypertension Sister   . Diabetes Sister    Social History:  reports that she has never smoked. She has never used smokeless tobacco. She reports that she does not drink alcohol and does not use drugs.  Allergies: No Known Allergies  Medications Prior to Admission  Medication Sig Dispense Refill  . albuterol (PROVENTIL HFA;VENTOLIN HFA) 108 (90 BASE) MCG/ACT inhaler Inhale into the lungs every 6 (six) hours as needed for wheezing or shortness of breath.    Marland Kitchen aspirin 81 MG tablet Take 81 mg by mouth daily.    . Azilsartan-Chlorthalidone (EDARBYCLOR) 40-12.5 MG TABS Take by mouth.    . Calcium Carbonate-Vitamin D (CALCIUM 500 + D) 500-125 MG-UNIT TABS Take by mouth.    . celecoxib (CELEBREX) 100  MG capsule Take 1 capsule (100 mg total) by mouth 2 (two) times daily. 60 capsule 0  . cetirizine (ZYRTEC) 10 MG tablet Take 10 mg by mouth daily.    . cholecalciferol (VITAMIN D) 1000 UNITS tablet Take 2,000 Units by mouth daily.     . ciclesonide (ALVESCO) 160 MCG/ACT inhaler 1 inhalation 2 times per day with spacer 6.1 g 5  . Cyanocobalamin (VITAMIN B 12 PO) Take by mouth.    . Dapagliflozin-metFORMIN HCl (XIGDUO XR PO) Take by mouth.    . Eluxadoline (VIBERZI) 100 MG TABS Viberzi 100 mg tablet    . fenofibrate (TRICOR) 145 MG tablet Take 145 mg by mouth daily.    Marland Kitchen HUMALOG KWIKPEN 100 UNIT/ML KwikPen SMARTSIG:Unit(s) SUB-Q    . Insulin Degludec (TRESIBA [place]) Inject into the skin.    . Magnesium 250 MG TABS Take 1 tablet by mouth.    Marland Kitchen omeprazole (PRILOSEC) 40 MG capsule Take 1 capsule (40 mg total) by mouth in the morning and at bedtime. 60 capsule 5  . Probiotic Product (PROBIOTIC PO) Take 1 capsule by mouth.    . Semaglutide (OZEMPIC, 1 MG/DOSE, [place]) Inject into the skin.    . Triamcinolone Acetonide (NASACORT ALLERGY 24HR NA) Place into the nose.    . dapagliflozin propanediol (FARXIGA) 10 MG TABS tablet Take 10 mg by mouth daily.    . methocarbamol (ROBAXIN)  500 MG tablet Take 1 tablet (500 mg total) by mouth every 8 (eight) hours as needed. 30 tablet 0  . oxyCODONE-acetaminophen (PERCOCET) 5-325 MG tablet Take 1 tablet by mouth every 4 (four) hours as needed for severe pain. 30 tablet 0    Results for orders placed or performed during the hospital encounter of 05/15/20 (from the past 48 hour(s))  Glucose, capillary     Status: Abnormal   Collection Time: 05/15/20  7:25 AM  Result Value Ref Range   Glucose-Capillary 132 (H) 70 - 99 mg/dL    Comment: Glucose reference range applies only to samples taken after fasting for at least 8 hours.   No results found.  Review of Systems  Musculoskeletal: Positive for arthralgias.  All other systems reviewed and are negative.   Blood  pressure 138/76, pulse 78, temperature 98.3 F (36.8 C), temperature source Oral, resp. rate 18, height 5\' 6"  (1.676 m), weight 85 kg, SpO2 100 %. Physical Exam Vitals reviewed.  HENT:     Head: Normocephalic.     Nose: Nose normal.     Mouth/Throat:     Mouth: Mucous membranes are moist.  Eyes:     Pupils: Pupils are equal, round, and reactive to light.  Cardiovascular:     Rate and Rhythm: Normal rate.     Pulses: Normal pulses.  Pulmonary:     Effort: Pulmonary effort is normal.  Abdominal:     General: Abdomen is flat.  Skin:    General: Skin is warm.     Capillary Refill: Capillary refill takes less than 2 seconds.  Neurological:     General: No focal deficit present.     Mental Status: She is alert.  Psychiatric:        Mood and Affect: Mood normal.   Examination of the right shoulder demonstrates some weakness to supraspinatus testing.  Grip EPL FPL interosseous strength intact.  Does have maintained passive range of motion between right and left shoulder.  No AC joint tenderness to direct palpation.  No other masses lymphadenopathy or skin changes noted in the shoulder girdle region.  Assessment/Plan Impression is right shoulder rotator cuff tear plan is right shoulder arthroscopy with evaluation of the biceps tendon possible biceps tenodesis and mini open rotator cuff tear repair.  Risk and benefits are discussed include not limited to infection nerve vessel damage shoulder stiffness as well as potential failure of the repair.  Patient understands risk benefits as well as the extensive rehabilitative process involved.  All questions answered  Anderson Malta, MD 05/15/2020, 8:52 AM

## 2020-05-15 NOTE — Brief Op Note (Signed)
   05/15/2020  11:39 AM  PATIENT:  Heather Stout  64 y.o. female  PRE-OPERATIVE DIAGNOSIS:  right shoulder rotator cuff tear  POST-OPERATIVE DIAGNOSIS:  right shoulder rotator cuff tear  PROCEDURE:  Procedure(s): right shoulder arthroscopy, debridement, mini open rotator cuff tear repair,  SURGEON:  Surgeon(s): Marlou Sa Tonna Corner, MD  ASSISTANT: magnant pa  ANESTHESIA:   general  EBL: 25 ml    Total I/O In: 1000 [I.V.:1000] Out: 50 [Blood:50]  BLOOD ADMINISTERED: none  DRAINS: none   LOCAL MEDICATIONS USED:  none  SPECIMEN:  No Specimen  COUNTS:  YES  TOURNIQUET:  * No tourniquets in log *  DICTATION: .Other Dictation: Dictation Number [id number]  PLAN OF CARE: Discharge to home after PACU  PATIENT DISPOSITION:  PACU - hemodynamically stable

## 2020-05-15 NOTE — Anesthesia Postprocedure Evaluation (Signed)
Anesthesia Post Note  Patient: Heather Stout  Procedure(s) Performed: right shoulder arthroscopy, debridement, mini open rotator cuff tear repair, biceps tenodesis (Right Shoulder)     Patient location during evaluation: PACU Anesthesia Type: General Level of consciousness: awake and alert Pain management: pain level controlled Vital Signs Assessment: post-procedure vital signs reviewed and stable Respiratory status: spontaneous breathing, nonlabored ventilation, respiratory function stable and patient connected to nasal cannula oxygen Cardiovascular status: blood pressure returned to baseline and stable Postop Assessment: no apparent nausea or vomiting Anesthetic complications: no   No complications documented.  Last Vitals:  Vitals:   05/15/20 1213 05/15/20 1229  BP: 118/63 124/70  Pulse: 83 78  Resp: 19 19  Temp:  36.8 C  SpO2: 94% 95%    Last Pain:  Vitals:   05/15/20 1229  TempSrc:   PainSc: 0-No pain                 Barnet Glasgow

## 2020-05-15 NOTE — Op Note (Signed)
NAME: Heather Stout, DEIST MEDICAL RECORD NF:[id number] ACCOUNT [id number] DATE OF BIRTH:[birth year] FACILITY: [facility] LOCATION: [facility] PHYSICIAN:Tanessa Tidd Randel Pigg, MD  OPERATIVE REPORT  DATE OF PROCEDURE:  05/15/2020  PREOPERATIVE DIAGNOSIS:  Right shoulder rotator cuff tear.  POSTOPERATIVE DIAGNOSIS:  Right shoulder rotator cuff tear.  PROCEDURE:  Right shoulder arthroscopy with limited debridement of superior labrum where the biceps had ruptured with subsequent mini open rotator cuff tear repair.  SURGEON:  Meredith Pel, MD  ASSISTANT:  Annie Main, PA.  INDICATIONS:  This is a 64 year old patient with right shoulder pain of long duration, atraumatic with rotator cuff tear by MRI scanning, presents for operative management after explanation of risks and benefits.  PROCEDURE IN DETAIL:  The patient was brought to the operating room where general anesthetic was induced.  Preoperative antibiotics administered.  Timeout was called.  The patient was placed in the beach chair position with the head in neutral position.   Right arm, shoulder and hand prescrubbed with alcohol and Betadine, allowed to air dry, prepped with DuraPrep solution and draped in sterile manner.  Charlie Pitter was used to seal the operative field as well as cover the axilla.  Timeout was called.  Posterior  portal was created 2 cm medial and inferior to the posterolateral margin of the acromion.  Diagnostic arthroscopy was performed.  Anterior portal created under direct visualization.  The patient did have a tear of the supraspinatus, which was teardrop  in nature, but about a 1.5 to 2 cm medial to the attachment of the greater tuberosity.  There was also partial thickness tearing of the infraspinatus.  Subscap was intact.  Biceps tendon was out of the joint and out of the groove.  The anterior portal  was created under direct visualization and debridement of degenerative SLAP tear was performed which was present after  rupture of the biceps tendon.  Following this debridement, the glenohumeral articular surfaces were intact.  Anterior, inferior,  posterior inferior glenohumeral ligaments intact.  Next, instruments were removed.  Portals were closed using 3-0 nylon.  Ioban then used to cover the entire operative field.  Incision made off the anterolateral margin of the acromion.  Deltoid was split  a measured distance of 4 cm between the middle and anterior raphae, marked with #1 Vicryl suture.  Bursectomy performed.  The patient did have a small spur, which was removed with a rasp.  The bursa was removed.  Biceps tendon was not present in the  bicipital groove consistent with her preoperative popeye deformity.  Next, the rotator cuff tear was identified.  There was a teardrop-shaped tear, which was primarily medial to the greater tuberosity attachment.  This was an unusual tear.  Traction  sutures placed on each limb of the tear, which was about 1.5 to 2 cm longitudinally.  The footprint was debrided with a curette.  One Arthrex SutureTak was placed at the articular margin interface with the greater tuberosity.  The 4 suture limbs were  then passed through the infraspinatus and held.  Next, a second anchor was placed, which was another knotless suture anchor with suture tapes.  These 2 limbs were also passed through each limb of the teardrop-shaped tear of the infraspinatus.  Prior to  tying these, one 2-0 FiberWire was placed in inverted fashion to close the medial most aspect of the tear.  Two more 2-0 FiberWires were placed on the lateral most aspect of the tear within healthy tendon which was still attached to the greater  tuberosity.  The suture limbs were then passed using a Scorpion suture passer medially and laterally through each flap of the longitudinal teardrop-type tear.  The 2-0 FiberWire suture medially was tied and then the 2 sutures were tied to bring the flaps  together.  The suture tapes were held.  Two  other FiberWires were tied in inverted fashion and the end was then sutured out through a free needle at the rotator cuff attachment site margin to the greater tuberosity.  This will be later incorporated into  a SwiveLock.  The infraspinatus sutures were then tied.  The limbs were crossed.  Sutures were then placed into 2 SwiveLocks, which were tapped into position with excellent waterproof and watertight repair achieved.  A thorough irrigation was then  performed.  Self-retaining retractor was removed.  Deltoid split closed using #1 Vicryl suture, followed by interrupted inverted 0 Vicryl suture, 2-0 Vicryl suture and 3-0 Monocryl.  Impervious dressings and shoulder immobilizer placed.  The patient  tolerated the procedure well without immediate complications, transferred to the recovery room in stable condition.  Luke's assistance was required at all times for retraction, opening and closing.  His assistance was a medical necessity.  VN/NUANCE  D:05/15/2020 T:05/15/2020 JOB:[id number]

## 2020-05-15 NOTE — Anesthesia Procedure Notes (Signed)
Procedure Name: Intubation Date/Time: 05/15/2020 9:05 AM Performed by: Bufford Spikes, CRNA Pre-anesthesia Checklist: Patient identified, Emergency Drugs available, Suction available and Patient being monitored Patient Re-evaluated:Patient Re-evaluated prior to induction Oxygen Delivery Method: Circle system utilized Preoxygenation: Pre-oxygenation with 100% oxygen Induction Type: IV induction Ventilation: Mask ventilation without difficulty Laryngoscope Size: Miller and 2 Grade View: Grade II Tube type: Oral Tube size: 7.0 mm Number of attempts: 1 Airway Equipment and Method: Stylet and Oral airway Placement Confirmation: ETT inserted through vocal cords under direct vision,  positive ETCO2 and breath sounds checked- equal and bilateral Secured at: 21 cm Tube secured with: Tape Dental Injury: Teeth and Oropharynx as per pre-operative assessment

## 2020-05-15 NOTE — Discharge Instructions (Signed)

## 2020-05-15 NOTE — Progress Notes (Signed)
AssistedDr. Houser with right, ultrasound guided, interscalene  block. Side rails up, monitors on throughout procedure. See vital signs in flow sheet. Tolerated Procedure well.  

## 2020-05-15 NOTE — Anesthesia Procedure Notes (Signed)
Anesthesia Regional Block: Interscalene brachial plexus block   Pre-Anesthetic Checklist: ,, timeout performed, Correct Patient, Correct Site, Correct Laterality, Correct Procedure, Correct Position, site marked, Risks and benefits discussed,  Surgical consent,  Pre-op evaluation,  At surgeon's request and post-op pain management  Laterality: Upper and Right  Prep: Maximum Sterile Barrier Precautions used, chloraprep       Needles:  Injection technique: Single-shot  Needle Type: Echogenic Needle     Needle Length: 5cm  Needle Gauge: 21     Additional Needles:   Procedures:,,,, ultrasound used (permanent image in chart),,,,  Narrative:  Start time: 05/15/2020 7:50 AM End time: 05/15/2020 7:58 AM Injection made incrementally with aspirations every 5 mL.  Performed by: Personally  Anesthesiologist: Barnet Glasgow, MD  Additional Notes: Block assessed prior to procedure. Patient tolerated procedure well.

## 2020-05-15 NOTE — Transfer of Care (Signed)
Immediate Anesthesia Transfer of Care Note  Patient: Heather Stout  Procedure(s) Performed: right shoulder arthroscopy, debridement, mini open rotator cuff tear repair, biceps tenodesis (Right Shoulder)  Patient Location: PACU  Anesthesia Type:General  Level of Consciousness: awake, alert  and oriented  Airway & Oxygen Therapy: Patient Spontanous Breathing and Patient connected to nasal cannula oxygen  Post-op Assessment: Report given to RN, Post -op Vital signs reviewed and stable and Patient moving all extremities X 4  Post vital signs: Reviewed and stable  Last Vitals:  Vitals Value Taken Time  BP 109/63 05/15/20 1152  Temp    Pulse 91 05/15/20 1154  Resp 24 05/15/20 1154  SpO2 100 % 05/15/20 1154  Vitals shown include unvalidated device data.  Last Pain:  Vitals:   05/15/20 0721  TempSrc: Oral  PainSc: 0-No pain      Patients Stated Pain Goal: 3 (12/82/08 1388)  Complications: No complications documented.

## 2020-05-16 ENCOUNTER — Telehealth: Payer: Self-pay

## 2020-05-16 ENCOUNTER — Encounter (HOSPITAL_BASED_OUTPATIENT_CLINIC_OR_DEPARTMENT_OTHER): Payer: Self-pay | Admitting: Orthopedic Surgery

## 2020-05-16 MED ORDER — ONDANSETRON HCL 4 MG PO TABS: 4.0000 mg | ORAL_TABLET | Freq: Three times a day (TID) | ORAL | 0 refills | Status: DC | PRN

## 2020-05-16 MED FILL — ONDANSETRON HCL 4 MG TABLET: 4 | 10 days supply | Qty: 30 | Fill #0

## 2020-05-16 NOTE — Telephone Encounter (Signed)
Pt would like something called in for nausea. She also states that [facility] called in Celebrex for her but her d/c instructions say not to take Celebrex. Please call to discuss.

## 2020-05-16 NOTE — Telephone Encounter (Signed)
Spoke with patient and advised

## 2020-05-16 NOTE — Addendum Note (Signed)
Addended byLaurann Montana on: 05/16/2020 08:52 AM   Modules accepted: Orders

## 2020-05-16 NOTE — Telephone Encounter (Signed)
I sent in nausea medication for patient. Please advise on celebrex.

## 2020-05-16 NOTE — Telephone Encounter (Signed)
Yes on celebrex for post op pain

## 2020-05-17 NOTE — Addendum Note (Signed)
Addendum  created 05/17/20 0756 by Eriberto Felch, Ernesta Amble, CRNA   Charge Capture section accepted

## 2020-05-19 ENCOUNTER — Telehealth: Payer: Self-pay | Admitting: Orthopedic Surgery

## 2020-05-19 NOTE — Telephone Encounter (Signed)
Pt called stating she has some questions about some bruises on her arm  [phone]

## 2020-05-19 NOTE — Telephone Encounter (Signed)
Can you please call patient.

## 2020-05-22 ENCOUNTER — Ambulatory Visit (INDEPENDENT_AMBULATORY_CARE_PROVIDER_SITE_OTHER): Payer: No Typology Code available for payment source | Admitting: Orthopedic Surgery

## 2020-05-22 ENCOUNTER — Telehealth: Payer: Self-pay

## 2020-05-22 ENCOUNTER — Other Ambulatory Visit: Payer: Self-pay | Admitting: Surgical

## 2020-05-22 DIAGNOSIS — M75121 Complete rotator cuff tear or rupture of right shoulder, not specified as traumatic: Secondary | ICD-10-CM

## 2020-05-22 MED ORDER — OXYCODONE-ACETAMINOPHEN 5-325 MG PO TABS
1.0000 | ORAL_TABLET | Freq: Four times a day (QID) | ORAL | 0 refills | Status: DC | PRN
Start: 2020-05-22 — End: 2021-05-11

## 2020-05-22 MED FILL — OXYCODONE-APAP 5-325MG: 5-325 | 9 days supply | Qty: 35 | Fill #0

## 2020-05-22 NOTE — Telephone Encounter (Signed)
Submitted RX 

## 2020-05-22 NOTE — Telephone Encounter (Signed)
Patient needing refill on oxy. Per Dr Marlou Sa, please submit oxy 1 po q 4-6hrs prn pain #35

## 2020-05-23 ENCOUNTER — Encounter: Payer: Self-pay | Admitting: Orthopedic Surgery

## 2020-05-23 ENCOUNTER — Encounter: Payer: Self-pay | Admitting: Allergy and Immunology

## 2020-05-23 ENCOUNTER — Ambulatory Visit (INDEPENDENT_AMBULATORY_CARE_PROVIDER_SITE_OTHER): Payer: No Typology Code available for payment source | Admitting: Allergy and Immunology

## 2020-05-23 ENCOUNTER — Other Ambulatory Visit: Payer: Self-pay

## 2020-05-23 VITALS — BP 118/78 | HR 62 | Temp 98.0°F | Resp 16

## 2020-05-23 DIAGNOSIS — J324 Chronic pansinusitis: Secondary | ICD-10-CM | POA: Diagnosis not present

## 2020-05-23 DIAGNOSIS — J479 Bronchiectasis, uncomplicated: Secondary | ICD-10-CM

## 2020-05-23 DIAGNOSIS — J4530 Mild persistent asthma, uncomplicated: Secondary | ICD-10-CM | POA: Diagnosis not present

## 2020-05-23 DIAGNOSIS — J3089 Other allergic rhinitis: Secondary | ICD-10-CM

## 2020-05-23 DIAGNOSIS — K219 Gastro-esophageal reflux disease without esophagitis: Secondary | ICD-10-CM

## 2020-05-23 NOTE — Progress Notes (Signed)
[place]   Follow-up Note  Referring Provider: Deland Pretty, MD Primary Provider: Deland Pretty, MD Date of Office Visit: 05/23/2020  Subjective:   Heather Stout (DOB: [birth year]) is a 64 y.o. female who returns to the [facility] on 05/23/2020 in re-evaluation of the following:  HPI: Rim presents to this clinic in evaluation of asthma and bronchiectasis and allergic rhinitis and chronic sinusitis and LPR contributing to cough.  Her last visit to this clinic was 21 March 2020 at which point in time her cough was 40% better.  She has continued to improve regarding her cough and this is a minimal issue at this point in time.  As well the sensation of an irritated throat appears to have improved significantly and she has very little problems with her upper airway.  She does not use a short acting bronchodilator.  She had recent right rotator cuff surgery and is undergoing rehab for that condition.  Allergies as of 05/23/2020   No Known Allergies     Medication List      albuterol 108 (90 Base) MCG/ACT inhaler Commonly known as: VENTOLIN HFA Inhale into the lungs every 6 (six) hours as needed for wheezing or shortness of breath.   Alvesco 160 MCG/ACT inhaler Generic drug: ciclesonide 1 inhalation 2 times per day with spacer   aspirin 81 MG tablet Take 81 mg by mouth daily.   Calcium 500 + D 500-125 MG-UNIT Tabs Generic drug: Calcium Carbonate-Vitamin D Take by mouth.   cetirizine 10 MG tablet Commonly known as: ZYRTEC Take 10 mg by mouth daily.   cholecalciferol 1000 units tablet Commonly known as: VITAMIN D Take 2,000 Units by mouth daily.   Edarbyclor 40-12.5 MG Tabs Generic drug: Azilsartan-Chlorthalidone Take by mouth.   Farxiga 10 MG Tabs tablet Generic drug: dapagliflozin propanediol Take 10 mg by mouth daily.   fenofibrate 145 MG tablet Commonly known as: TRICOR Take 145 mg by mouth  daily.   HumaLOG KwikPen 100 UNIT/ML KwikPen Generic drug: insulin lispro SMARTSIG:Unit(s) SUB-Q   Magnesium 250 MG Tabs Take 1 tablet by mouth.   methocarbamol 500 MG tablet Commonly known as: Robaxin Take 1 tablet (500 mg total) by mouth every 8 (eight) hours as needed.   NASACORT ALLERGY 24HR NA Place into the nose.   omeprazole 40 MG capsule Commonly known as: PRILOSEC Take 1 capsule (40 mg total) by mouth in the morning and at bedtime.   ondansetron 4 MG tablet Commonly known as: Zofran Take 1 tablet (4 mg total) by mouth every 8 (eight) hours as needed for nausea or vomiting.   oxyCODONE-acetaminophen 5-325 MG tablet Commonly known as: Percocet Take 1 tablet by mouth every 6 (six) hours as needed for severe pain.   OZEMPIC (1 MG/DOSE) [place] Inject into the skin.   PROBIOTIC PO Take 1 capsule by mouth.   TRESIBA  Inject into the skin.   Viberzi 100 MG Tabs Generic drug: Eluxadoline Viberzi 100 mg tablet   VITAMIN B 12 PO Take by mouth.   XIGDUO XR PO Take by mouth.       Past Medical History:  Diagnosis Date  . Asthma   . Essential hypertension, benign   . GERD (gastroesophageal reflux disease)   . IBS (irritable bowel syndrome)   . Left anterior hemiblock   . Other and unspecified hyperlipidemia   . Sleep apnea    cpap  . Type II or unspecified  type diabetes mellitus without mention of complication, not stated as uncontrolled     Past Surgical History:  Procedure Laterality Date  . FUNCTIONAL ENDOSCOPIC SINUS SURGERY  1990  . LUMBAR LAMINECTOMY  2002  . LUMBAR LAMINECTOMY  2005  . SHOULDER ARTHROSCOPY WITH OPEN ROTATOR CUFF REPAIR Right 05/15/2020   Procedure: right shoulder arthroscopy, debridement, mini open rotator cuff tear repair, biceps tenodesis;  Surgeon: Meredith Pel, MD;  Location: [facility];  Service: Orthopedics;  Laterality: Right;  . TONSILLECTOMY  1980    Review of systems negative except as noted in  HPI / PMHx or noted below:  Review of Systems  Constitutional: Negative.   HENT: Negative.   Eyes: Negative.   Respiratory: Negative.   Cardiovascular: Negative.   Gastrointestinal: Negative.   Genitourinary: Negative.   Musculoskeletal: Negative.   Skin: Negative.   Neurological: Negative.   Endo/Heme/Allergies: Negative.   Psychiatric/Behavioral: Negative.      Objective:   Vitals:   05/23/20 1617  BP: 118/78  Pulse: 62  Resp: 16  Temp: 98 F (36.7 C)  SpO2: 97%           Physical Exam Constitutional:      Appearance: She is not diaphoretic.  HENT:     Head: Normocephalic.     Right Ear: Tympanic membrane, ear canal and external ear normal.     Left Ear: Tympanic membrane, ear canal and external ear normal.     Nose: Nose normal. No mucosal edema or rhinorrhea.     Mouth/Throat:     Pharynx: Uvula midline. No oropharyngeal exudate.  Eyes:     Conjunctiva/sclera: Conjunctivae normal.  Neck:     Thyroid: No thyromegaly.     Trachea: Trachea normal. No tracheal tenderness or tracheal deviation.  Cardiovascular:     Rate and Rhythm: Normal rate and regular rhythm.     Heart sounds: Normal heart sounds, S1 normal and S2 normal. No murmur heard.   Pulmonary:     Effort: No respiratory distress.     Breath sounds: Normal breath sounds. No stridor. No wheezing or rales.  Lymphadenopathy:     Head:     Right side of head: No tonsillar adenopathy.     Left side of head: No tonsillar adenopathy.     Cervical: No cervical adenopathy.  Skin:    Findings: No erythema or rash.     Nails: There is no clubbing.  Neurological:     Mental Status: She is alert.     Diagnostics:    Spirometry was performed and demonstrated an FEV1 of 1.94 at 73 % of predicted.  Results of blood tests obtained 21 March 2020 identified IgG 789 mg/DL, IgA 130 NG/DL, IgM 58 NG/DL, good levels of antibodies directed against multiple serotypes of pneumococcus on a pneumo 23 assay, tetanus  IgG 1.77U/mL, WBC 5.8, absolute eosinophil 200, absolute lymphocyte 1400, hemoglobin 14.4  Assessment and Plan:   1. Asthma, well controlled, mild persistent   2. Other allergic rhinitis   3. Chronic pansinusitis   4. Bronchiectasis without complication ([place])   5. LPRD (laryngopharyngeal reflux disease)     1.  Continue to Treat and prevent inflammation:   A. OTC Nasacort - 1 spray each nostril 1- 2 times per day  B. Alvesco 160 - 1 inhalation 1- 2 times per day w/ spacer   2.  Continue to Treat and prevent reflux/LPR:   A. Omeprazole 40 mg - 1 tablet 2  times per day  B. Slowly consolidate all caffeine consumption  C. Replace throat clearing maneuver with swallowing maneuver  3.  If needed:   A. Albuterol HFA - 2 inhalations every 4-6 hours  B. Cetirizine 10 mg - 1 tablet 1 time per day  4.  Return to clinic in 12 weeks or earlier if problem  5. Obtain fall flu vaccine  Bryla appears to be doing much better at this point in time I think there may be an opportunity to consolidate some of her medical therapy and she can attempt to decrease her Nasacort and Alvesco to just 1 time per day while she continues to utilize therapy directed against reflux with omeprazole twice a day.  Assuming she continues to do well with this plan I will see her back in his clinic in 12 weeks or earlier if there is a problem.  Allena Katz, MD Allergy / Immunology [place]

## 2020-05-23 NOTE — Patient Instructions (Addendum)
  1.  Continue to Treat and prevent inflammation:   A. OTC Nasacort - 1 spray each nostril 1- 2 times per day  B. Alvesco 160 - 1 inhalation 1- 2 times per day w/ spacer   2.  Continue to Treat and prevent reflux/LPR:   A. Omeprazole 40 mg - 1 tablet 2 times per day  B. Slowly consolidate all caffeine consumption  C. Replace throat clearing maneuver with swallowing maneuver  3.  If needed:   A. Albuterol HFA - 2 inhalations every 4-6 hours  B. Cetirizine 10 mg - 1 tablet 1 time per day  4.  Return to clinic in 12 weeks or earlier if problem  5. Obtain fall flu vaccine

## 2020-05-23 NOTE — Progress Notes (Signed)
   Post-Op Visit Note   Patient: Heather Stout           Date of Birth: 01-24-1956           MRN: [id number] Visit Date: 05/22/2020 PCP: Deland Pretty, MD   Assessment & Plan:  Chief Complaint:  Chief Complaint  Patient presents with  . Right Shoulder - Routine Post Op   Visit Diagnoses:  1. Nontraumatic complete tear of right rotator cuff     Plan: Heather Stout is a 64 year old patient is now about a week out right shoulder rotator cuff repair.  In the CPM machine at 72 degrees.  Sleeping upright in the sofa.  On exam she has good passive range of motion and functional deltoid.  Plan is to continue CPM for 2 more weeks and then start physical therapy beginning week 3 postop for passive range of motion and isometrics.  Okay to discontinue the sling in 2 weeks but no lifting with that right arm.  I will see her back in 3 weeks for clinical recheck.  Follow-Up Instructions: Return in about 3 weeks (around 06/12/2020).   Orders:  Orders Placed This Encounter  Procedures  . Ambulatory referral to Physical Therapy   No orders of the defined types were placed in this encounter.   Imaging: No results found.  PMFS History: Patient Active Problem List   Diagnosis Date Noted  . Essential hypertension 04/04/2015  . Hyperlipemia 04/04/2015  . Irritable bowel syndrome 04/04/2015  . Diabetes ([place]) 11/29/2014   Past Medical History:  Diagnosis Date  . Asthma   . Essential hypertension, benign   . GERD (gastroesophageal reflux disease)   . IBS (irritable bowel syndrome)   . Left anterior hemiblock   . Other and unspecified hyperlipidemia   . Sleep apnea    cpap  . Type II or unspecified type diabetes mellitus without mention of complication, not stated as uncontrolled     Family History  Problem Relation Age of Onset  . Hypertension Father   . Hypertension Brother   . Diabetes Brother   . Hypertension Brother   . Crohn's disease Brother   . Hypertension Sister   . Diabetes  Sister     Past Surgical History:  Procedure Laterality Date  . FUNCTIONAL ENDOSCOPIC SINUS SURGERY  1990  . LUMBAR LAMINECTOMY  2002  . LUMBAR LAMINECTOMY  2005  . SHOULDER ARTHROSCOPY WITH OPEN ROTATOR CUFF REPAIR Right 05/15/2020   Procedure: right shoulder arthroscopy, debridement, mini open rotator cuff tear repair, biceps tenodesis;  Surgeon: Meredith Pel, MD;  Location: [facility];  Service: Orthopedics;  Laterality: Right;  . TONSILLECTOMY  1980   Social History   Occupational History  . Occupation: Insurance underwriter  Tobacco Use  . Smoking status: Never Smoker  . Smokeless tobacco: Never Used  Substance and Sexual Activity  . Alcohol use: No    Alcohol/week: 0.0 standard drinks  . Drug use: No  . Sexual activity: Not on file

## 2020-05-24 ENCOUNTER — Encounter: Payer: Self-pay | Admitting: Allergy and Immunology

## 2020-05-25 MED FILL — OMEPRAZOLE 40 MG CPDR: 40 | 30 days supply | Qty: 60 | Fill #3

## 2020-05-25 MED FILL — EDARBYCLOR 40-25 MG TABLET: 40-25 | 90 days supply | Qty: 90 | Fill #4

## 2020-05-25 MED FILL — UNIFINE PENTIPS 31GX3/16: 31G X 5 MM | 75 days supply | Qty: 300 | Fill #1

## 2020-05-25 MED FILL — HUMALOG 100 UNITS/ML KWIKPE: 100 | 30 days supply | Qty: 18 | Fill #2

## 2020-06-07 ENCOUNTER — Telehealth: Payer: Self-pay

## 2020-06-07 ENCOUNTER — Ambulatory Visit (INDEPENDENT_AMBULATORY_CARE_PROVIDER_SITE_OTHER): Payer: No Typology Code available for payment source | Admitting: Rehabilitative and Restorative Service Providers"

## 2020-06-07 ENCOUNTER — Encounter: Payer: Self-pay | Admitting: Rehabilitative and Restorative Service Providers"

## 2020-06-07 ENCOUNTER — Other Ambulatory Visit: Payer: Self-pay

## 2020-06-07 DIAGNOSIS — R600 Localized edema: Secondary | ICD-10-CM

## 2020-06-07 DIAGNOSIS — M6281 Muscle weakness (generalized): Secondary | ICD-10-CM | POA: Diagnosis not present

## 2020-06-07 DIAGNOSIS — M25512 Pain in left shoulder: Secondary | ICD-10-CM | POA: Diagnosis not present

## 2020-06-07 DIAGNOSIS — G8929 Other chronic pain: Secondary | ICD-10-CM

## 2020-06-07 DIAGNOSIS — M25612 Stiffness of left shoulder, not elsewhere classified: Secondary | ICD-10-CM | POA: Diagnosis not present

## 2020-06-07 NOTE — Patient Instructions (Signed)
Access Code: [email or web address] URL: [email or web address] Date: 06/07/2020 Prepared by: Vista Mink  Exercises Circular Shoulder Pendulum with Table Support - 3-5 x daily - 7 x weekly - 1 sets - 20 reps Flexion-Extension Shoulder Pendulum with Table Support - 3-5 x daily - 7 x weekly - 1 sets - 20 reps

## 2020-06-07 NOTE — Telephone Encounter (Signed)
Parker for that thx

## 2020-06-07 NOTE — Telephone Encounter (Signed)
Patient called stated that she feels like she will need an additional week out of work. I provided her a note to keep her out through 06/18/20 and to return on 06/19/20. Also requested that the note be faxed to [facility] Claim# [id number] to fax number [phone]

## 2020-06-07 NOTE — Therapy (Signed)
[facility] Physical Therapy [address] [place], [place], [address] Phone: [phone]   Fax:  [phone]  Physical Therapy Evaluation  Patient Details  Name: Heather Stout MRN: [id number] Date of Birth: 1956/04/27 Referring Provider (PT): Meredith Pel MD   Encounter Date: 06/07/2020   PT End of Session - 06/07/20 1710    Visit Number 1    Number of Visits 18    PT Start Time 2094    PT Stop Time 7096    PT Time Calculation (min) 40 min    Activity Tolerance Patient tolerated treatment well;No increased pain    Behavior During Therapy WFL for tasks assessed/performed           Past Medical History:  Diagnosis Date  . Asthma   . Essential hypertension, benign   . GERD (gastroesophageal reflux disease)   . IBS (irritable bowel syndrome)   . Left anterior hemiblock   . Other and unspecified hyperlipidemia   . Sleep apnea    cpap  . Type II or unspecified type diabetes mellitus without mention of complication, not stated as uncontrolled     Past Surgical History:  Procedure Laterality Date  . FUNCTIONAL ENDOSCOPIC SINUS SURGERY  1990  . LUMBAR LAMINECTOMY  2002  . LUMBAR LAMINECTOMY  2005  . SHOULDER ARTHROSCOPY WITH OPEN ROTATOR CUFF REPAIR Right 05/15/2020   Procedure: right shoulder arthroscopy, debridement, mini open rotator cuff tear repair, biceps tenodesis;  Surgeon: Meredith Pel, MD;  Location: [place];  Service: Orthopedics;  Laterality: Right;  . TONSILLECTOMY  1980    There were no vitals filed for this visit.    Subjective Assessment - 06/07/20 1706    Subjective R RTC repair 05/15/20 with biceps tenodesis.  Heather Stout slept in the bed last night and has had minimal pain today.  Still aches at times and AROM and strength are limited.    Limitations Other (comment)   Any R UE use   Currently in Pain? No/denies              [facility] PT Assessment - 06/07/20 0001      Assessment   Medical Diagnosis  R RTC repair    Referring Provider (PT) Meredith Pel MD    Onset Date/Surgical Date 05/15/20      Balance Screen   Has the patient fallen in the past 6 months No    Has the patient had a decrease in activity level because of a fear of falling?  No    Is the patient reluctant to leave their home because of a fear of falling?  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    Leisure Physical work      Cognition   Overall Cognitive Status Within Functional Limits for tasks assessed      ROM / Strength   AROM / PROM / Strength AROM      AROM   Overall AROM  Deficits    AROM Assessment Site Shoulder    Right/Left Shoulder Left;Right    Right Shoulder Flexion 120 Degrees    Right Shoulder Internal Rotation 65 Degrees    Right Shoulder External Rotation 70 Degrees    Left Shoulder Flexion 170 Degrees    Left Shoulder Internal Rotation 65 Degrees    Left Shoulder External Rotation 110  Degrees                      Objective measurements completed on examination: See above findings.       [facility] Adult PT Treatment/Exercise - 06/07/20 0001      Therapeutic Activites    Therapeutic Activities Other Therapeutic Activities   Education regarding expected outsomes, PT phases & timeline     Exercises   Exercises Shoulder      Shoulder Exercises: Standing   Other Standing Exercises Pendulum: F/B; CW; CCW 20X each                  PT Education - 06/07/20 1709    Education Details Reviewed timelines and expectations post-RTC repair.  Discussed ideas for comfort sleeping and encouraged her to not be in a rush to get rid of her brace/sling expecially with return to work.    Person(s) Educated Patient    Methods Explanation;Demonstration;Tactile cues;Verbal cues;Handout    Comprehension Verbal cues required;Need further instruction;Returned  demonstration;Verbalized understanding;Tactile cues required               PT Long Term Goals - 06/07/20 1719      PT LONG TERM GOAL #1   Title Heather Stout will have an improved FOTO score at DC.    Baseline COMPLETE VISIT 2    Time 9    Period Weeks    Status New    Target Date 08/11/20      PT LONG TERM GOAL #2   Title Improve R shoulder AROM for flexion to 170 degrees; ER to 90; IR to 60 and horizontal adduction to 40.    Baseline See objective.    Time 9    Period Weeks    Status New    Target Date 08/11/20      PT LONG TERM GOAL #3   Title Improve R shoulder strength to 4-/5 MMT for ER and IR    Baseline Deferred due to < 4 weeks post-surgery.  90% strength expected 9-12 months post-surgery.    Time 9    Period Weeks    Status New    Target Date 08/11/20      PT LONG TERM GOAL #4   Title Heather Stout will be independent with her long-term HEP at DC.    Time 9    Period Weeks    Status New    Target Date 08/11/20                  Plan - 06/07/20 1711    Clinical Impression Statement Heather Stout is doing very well just over 3 weeks past her R RTC repair with biceps tenodesis.  AAROM and AROM will be the early emphasis with strengthening delayed until week 10-12 or until recommended by Dr. Marlou Sa.  Her prognosis is good to meet LTGs.    Personal Factors and Comorbidities Comorbidity 1    Comorbidities Multiple back surgeries    Examination-Activity Limitations Dressing;Sleep;Lift;Carry;Reach Overhead    Examination-Participation Restrictions Occupation;Community Activity    Stability/Clinical Decision Making Stable/Uncomplicated    Clinical Decision Making Low    Rehab Potential Good    PT Frequency 2x / week    PT Duration Other (comment)   9 weeks   PT Treatment/Interventions ADLs/Self Care Home Management;Cryotherapy;Therapeutic activities;Therapeutic exercise;Neuromuscular re-education;Patient/family education;Manual techniques;Passive range of  motion;Vasopneumatic Device    PT Next Visit Plan PROM, AAROM and AROM as appropriate for < 4 weeks post-surgery  PT Home Exercise Plan Access Code: [email or web address]    Consulted and Agree with Plan of Care Patient           Patient will benefit from skilled therapeutic intervention in order to improve the following deficits and impairments:  Decreased activity tolerance, Decreased endurance, Decreased range of motion, Decreased strength, Hypomobility, Impaired UE functional use, Pain, Increased edema  Visit Diagnosis: Muscle weakness (generalized)  Stiffness of left shoulder, not elsewhere classified  Chronic left shoulder pain  Localized edema     Problem List Patient Active Problem List   Diagnosis Date Noted  . Essential hypertension 04/04/2015  . Hyperlipemia 04/04/2015  . Irritable bowel syndrome 04/04/2015  . Diabetes ([place]) 11/29/2014    Farley Ly PT, MPT 06/07/2020, 5:24 PM  [facility] Physical Therapy [address] [place], [place], [address] Phone: [phone]   Fax:  [phone]  Name: Heather Stout MRN: [id number] Date of Birth: 19-Aug-1956

## 2020-06-09 ENCOUNTER — Ambulatory Visit (INDEPENDENT_AMBULATORY_CARE_PROVIDER_SITE_OTHER): Payer: No Typology Code available for payment source | Admitting: Orthopedic Surgery

## 2020-06-09 ENCOUNTER — Encounter: Payer: Self-pay | Admitting: Orthopedic Surgery

## 2020-06-09 DIAGNOSIS — M75121 Complete rotator cuff tear or rupture of right shoulder, not specified as traumatic: Secondary | ICD-10-CM

## 2020-06-10 ENCOUNTER — Encounter: Payer: Self-pay | Admitting: Orthopedic Surgery

## 2020-06-10 NOTE — Progress Notes (Signed)
Post-Op Visit Note   Patient: Heather Stout           Date of Birth: February 21, 1956           MRN: [id number] Visit Date: 06/09/2020 PCP: Deland Pretty, MD   Assessment & Plan:  Chief Complaint:  Chief Complaint  Patient presents with  . Right Shoulder - Routine Post Op, Follow-up   Visit Diagnoses:  1. Nontraumatic complete tear of right rotator cuff     Plan: Patient is a 64 year old female presents s/p right shoulder arthroscopy with mini open rotator cuff repair on 05/15/2020.  She notes that she is doing well and continues to progress.  She is only taking Tylenol as needed for pain.  She reports that she is sleeping better recently and has returned to sleeping in her bed rather than in a chair on a couch.  She is going to physical therapy 2 times per week.  On exam she has well-healing incisions without any surrounding erythema or expressible drainage.  She has 50 degrees external rotation, 100 degrees abduction, 120 degrees forward flexion.  There is no crepitus is felt with passive range of motion of the right shoulder.  Plan for her to return to work on 06/19/2020.  Follow-up in 4 weeks for clinical recheck.  Patient agreed with plan.  Plan on beginning strengthening at the 6-week postop mark.  At this time okay for isometric rotator cuff and deltoid strengthening.  Range of motion is excellent passively.  Still has a little pain with forward flexion but that should improve as the rotator cuff strengthens.  Follow-Up Instructions: Return in about 4 weeks (around 07/07/2020).   Orders:  No orders of the defined types were placed in this encounter.  No orders of the defined types were placed in this encounter.   Imaging: No results found.  PMFS History: Patient Active Problem List   Diagnosis Date Noted  . Essential hypertension 04/04/2015  . Hyperlipemia 04/04/2015  . Irritable bowel syndrome 04/04/2015  . Diabetes ([place]) 11/29/2014   Past Medical History:  Diagnosis  Date  . Asthma   . Essential hypertension, benign   . GERD (gastroesophageal reflux disease)   . IBS (irritable bowel syndrome)   . Left anterior hemiblock   . Other and unspecified hyperlipidemia   . Sleep apnea    cpap  . Type II or unspecified type diabetes mellitus without mention of complication, not stated as uncontrolled     Family History  Problem Relation Age of Onset  . Hypertension Father   . Hypertension Brother   . Diabetes Brother   . Hypertension Brother   . Crohn's disease Brother   . Hypertension Sister   . Diabetes Sister     Past Surgical History:  Procedure Laterality Date  . FUNCTIONAL ENDOSCOPIC SINUS SURGERY  1990  . LUMBAR LAMINECTOMY  2002  . LUMBAR LAMINECTOMY  2005  . SHOULDER ARTHROSCOPY WITH OPEN ROTATOR CUFF REPAIR Right 05/15/2020   Procedure: right shoulder arthroscopy, debridement, mini open rotator cuff tear repair, biceps tenodesis;  Surgeon: Meredith Pel, MD;  Location: [place];  Service: Orthopedics;  Laterality: Right;  . TONSILLECTOMY  1980   Social History   Occupational History  . Occupation: Insurance underwriter  Tobacco Use  . Smoking status: Never Smoker  . Smokeless tobacco: Never Used  Substance and Sexual Activity  . Alcohol use: No    Alcohol/week: 0.0 standard drinks  . Drug use: No  . Sexual  activity: Not on file

## 2020-06-12 ENCOUNTER — Encounter: Payer: No Typology Code available for payment source | Admitting: Physical Therapy

## 2020-06-12 ENCOUNTER — Ambulatory Visit: Payer: No Typology Code available for payment source | Admitting: Rehabilitative and Restorative Service Providers"

## 2020-06-12 ENCOUNTER — Ambulatory Visit: Payer: No Typology Code available for payment source | Admitting: Orthopedic Surgery

## 2020-06-12 ENCOUNTER — Other Ambulatory Visit (HOSPITAL_COMMUNITY): Payer: Self-pay | Admitting: Internal Medicine

## 2020-06-12 MED FILL — OZEMPIC (1 MG/DOSE) 4 MG/3M: 4 | 28 days supply | Qty: 3 | Fill #0

## 2020-06-13 ENCOUNTER — Encounter: Payer: Self-pay | Admitting: Physical Therapy

## 2020-06-13 ENCOUNTER — Other Ambulatory Visit: Payer: Self-pay

## 2020-06-13 ENCOUNTER — Ambulatory Visit (INDEPENDENT_AMBULATORY_CARE_PROVIDER_SITE_OTHER): Payer: No Typology Code available for payment source | Admitting: Physical Therapy

## 2020-06-13 DIAGNOSIS — R600 Localized edema: Secondary | ICD-10-CM

## 2020-06-13 DIAGNOSIS — M25512 Pain in left shoulder: Secondary | ICD-10-CM

## 2020-06-13 DIAGNOSIS — M25612 Stiffness of left shoulder, not elsewhere classified: Secondary | ICD-10-CM

## 2020-06-13 DIAGNOSIS — G8929 Other chronic pain: Secondary | ICD-10-CM

## 2020-06-13 DIAGNOSIS — M6281 Muscle weakness (generalized): Secondary | ICD-10-CM | POA: Diagnosis not present

## 2020-06-13 NOTE — Patient Instructions (Signed)
Access Code: [email or web address] URL: [email or web address] Date: 06/13/2020 Prepared by: Faustino Congress  Exercises Circular Shoulder Pendulum with Table Support - 3-5 x daily - 7 x weekly - 1 sets - 20 reps Flexion-Extension Shoulder Pendulum with Table Support - 3-5 x daily - 7 x weekly - 1 sets - 20 reps Isometric Shoulder Flexion at Wall - 3 x daily - 7 x weekly - 1 sets - 10 reps - 5 sec hold Isometric Shoulder Extension at Wall - 3 x daily - 7 x weekly - 1 sets - 10 reps - 5 sec hold Standing Isometric Shoulder Internal Rotation at Doorway - 3 x daily - 7 x weekly - 1 sets - 10 reps - 5 sec hold Standing Isometric Shoulder External Rotation with Doorway - 3 x daily - 7 x weekly - 1 sets - 10 reps - 5 sec hold Isometric Shoulder Abduction at Wall - 3 x daily - 7 x weekly - 1 sets - 10 reps - 5 sec hold

## 2020-06-13 NOTE — Therapy (Signed)
[facility] Physical Therapy [address] [place], [place], [address] Phone: [phone]   Fax:  [phone]  Physical Therapy Treatment  Patient Details  Name: Heather Stout MRN: [id number] Date of Birth: 15-Mar-1956 Referring Provider (PT): Heather Pel MD   Encounter Date: 06/13/2020   PT End of Session - 06/13/20 1254    Visit Number 2    Number of Visits 18    PT Start Time 4128    PT Stop Time 7867    PT Time Calculation (min) 30 min    Activity Tolerance Patient tolerated treatment well;No increased pain    Behavior During Therapy WFL for tasks assessed/performed           Past Medical History:  Diagnosis Date  . Asthma   . Essential hypertension, benign   . GERD (gastroesophageal reflux disease)   . IBS (irritable bowel syndrome)   . Left anterior hemiblock   . Other and unspecified hyperlipidemia   . Sleep apnea    cpap  . Type II or unspecified type diabetes mellitus without mention of complication, not stated as uncontrolled     Past Surgical History:  Procedure Laterality Date  . FUNCTIONAL ENDOSCOPIC SINUS SURGERY  1990  . LUMBAR LAMINECTOMY  2002  . LUMBAR LAMINECTOMY  2005  . SHOULDER ARTHROSCOPY WITH OPEN ROTATOR CUFF REPAIR Right 05/15/2020   Procedure: right shoulder arthroscopy, debridement, mini open rotator cuff tear repair, biceps tenodesis;  Surgeon: Heather Pel, MD;  Location: [place];  Service: Orthopedics;  Laterality: Right;  . TONSILLECTOMY  1980    There were no vitals filed for this visit.   Subjective Assessment - 06/13/20 1030    Subjective shoulder is aching, but overall doing better.  feels like she's using her shoulder a little more.    Limitations Other (comment)   Any R UE use   Currently in Pain? No/denies                             [facility] Adult PT Treatment/Exercise - 06/13/20 1041      Shoulder Exercises: Isometric Strengthening   Flexion  Limitations 10x5"    Extension Limitations 10x5"    External Rotation Limitations 10x5"    Internal Rotation Limitations 10x5"    ABduction Limitations 10x5"      Manual Therapy   Manual Therapy Joint mobilization;Passive ROM    Joint Mobilization G1-2 A/P and inf Rt shoulder    Passive ROM Rt shoulder in supine all motions to tolerance                  PT Education - 06/13/20 1253    Education Details isometrics    Person(s) Educated Patient    Methods Explanation;Demonstration;Handout    Comprehension Returned demonstration;Verbalized understanding;Need further instruction               PT Long Term Goals - 06/07/20 1719      PT LONG TERM GOAL #1   Title [name] will have an improved FOTO score at DC.    Baseline COMPLETE VISIT 2    Time 9    Period Weeks    Status New    Target Date 08/11/20      PT LONG TERM GOAL #2   Title Improve R shoulder AROM for flexion to 170 degrees; ER to 90; IR to 60 and horizontal adduction to 40.    Baseline See  objective.    Time 9    Period Weeks    Status New    Target Date 08/11/20      PT LONG TERM GOAL #3   Title Improve R shoulder strength to 4-/5 MMT for ER and IR    Baseline Deferred due to < 4 weeks post-surgery.  90% strength expected 9-12 months post-surgery.    Time 9    Period Weeks    Status New    Target Date 08/11/20      PT LONG TERM GOAL #4   Title [name] will be independent with her long-term HEP at DC.    Time 9    Period Weeks    Status New    Target Date 08/11/20                 Plan - 06/13/20 1254    Clinical Impression Statement Pt tolerated session well today, and unable to complete FOTO as she arrived late to session (had appt time mixed up).  Her PROM is excellent with some mild end range deficits but overall doing well.  Progressed to isometrics as allowed by MD today and tolerated well with pain 3/10.  Will continue to benefit from PT to maximize function.    Personal Factors  and Comorbidities Comorbidity 1    Comorbidities Multiple back surgeries    Examination-Activity Limitations Dressing;Sleep;Lift;Carry;Reach Overhead    Examination-Participation Restrictions Occupation;Community Activity    Stability/Clinical Decision Making Stable/Uncomplicated    Rehab Potential Good    PT Frequency 2x / week    PT Duration Other (comment)   9 weeks   PT Treatment/Interventions ADLs/Self Care Home Management;Cryotherapy;Therapeutic activities;Therapeutic exercise;Neuromuscular re-education;Patient/family education;Manual techniques;Passive range of motion;Vasopneumatic Device    PT Next Visit Plan PROM until 6 weeks post-op, isometrics, needs FOTO    PT Home Exercise Plan Access Code: [email or web address]    Consulted and Agree with Plan of Care Patient           Patient will benefit from skilled therapeutic intervention in order to improve the following deficits and impairments:  Decreased activity tolerance, Decreased endurance, Decreased range of motion, Decreased strength, Hypomobility, Impaired UE functional use, Pain, Increased edema  Visit Diagnosis: Muscle weakness (generalized)  Stiffness of left shoulder, not elsewhere classified  Chronic left shoulder pain  Localized edema     Problem List Patient Active Problem List   Diagnosis Date Noted  . Essential hypertension 04/04/2015  . Hyperlipemia 04/04/2015  . Irritable bowel syndrome 04/04/2015  . Diabetes ([place]) 11/29/2014      Heather Stout, PT, DPT 06/13/20 12:56 PM    [facility] [address] [place], [place], [address] Phone: [phone]   Fax:  [phone]  Name: Heather Stout MRN: [id number] Date of Birth: 1955/11/29

## 2020-06-16 ENCOUNTER — Ambulatory Visit (INDEPENDENT_AMBULATORY_CARE_PROVIDER_SITE_OTHER): Payer: No Typology Code available for payment source | Admitting: Physical Therapy

## 2020-06-16 ENCOUNTER — Encounter: Payer: Self-pay | Admitting: Physical Therapy

## 2020-06-16 ENCOUNTER — Other Ambulatory Visit: Payer: Self-pay

## 2020-06-16 DIAGNOSIS — R600 Localized edema: Secondary | ICD-10-CM

## 2020-06-16 DIAGNOSIS — M25512 Pain in left shoulder: Secondary | ICD-10-CM

## 2020-06-16 DIAGNOSIS — G8929 Other chronic pain: Secondary | ICD-10-CM

## 2020-06-16 DIAGNOSIS — M25612 Stiffness of left shoulder, not elsewhere classified: Secondary | ICD-10-CM | POA: Diagnosis not present

## 2020-06-16 DIAGNOSIS — M6281 Muscle weakness (generalized): Secondary | ICD-10-CM

## 2020-06-16 NOTE — Therapy (Signed)
[facility] Physical Therapy [address] [place], [place], [address] Phone: [phone]   Fax:  [phone]  Physical Therapy Treatment  Patient Details  Name: Heather Stout MRN: [id number] Date of Birth: [birth year] Referring Provider (PT): Meredith Pel MD   Encounter Date: 06/16/2020   PT End of Session - 06/16/20 0959    Visit Number 3    Number of Visits 18    PT Start Time 0930    PT Stop Time 1010    PT Time Calculation (min) 40 min    Activity Tolerance Patient tolerated treatment well;No increased pain    Behavior During Therapy WFL for tasks assessed/performed           Past Medical History:  Diagnosis Date  . Asthma   . Essential hypertension, benign   . GERD (gastroesophageal reflux disease)   . IBS (irritable bowel syndrome)   . Left anterior hemiblock   . Other and unspecified hyperlipidemia   . Sleep apnea    cpap  . Type II or unspecified type diabetes mellitus without mention of complication, not stated as uncontrolled     Past Surgical History:  Procedure Laterality Date  . FUNCTIONAL ENDOSCOPIC SINUS SURGERY  1990  . LUMBAR LAMINECTOMY  2002  . LUMBAR LAMINECTOMY  2005  . SHOULDER ARTHROSCOPY WITH OPEN ROTATOR CUFF REPAIR Right 05/15/2020   Procedure: right shoulder arthroscopy, debridement, mini open rotator cuff tear repair, biceps tenodesis;  Surgeon: Meredith Pel, MD;  Location: [place];  Service: Orthopedics;  Laterality: Right;  . TONSILLECTOMY  1980    There were no vitals filed for this visit.   Subjective Assessment - 06/16/20 0932    Subjective shoulder is sore - starting to wake up a little more    Limitations Other (comment)   Any R UE use   Patient Stated Goals regain use of arm    Currently in Pain? Yes    Pain Score 2     Pain Location Shoulder    Pain Orientation Right    Pain Descriptors / Indicators Aching    Pain Type Acute pain;Surgical pain    Pain Onset 1 to 4  weeks ago    Pain Frequency Intermittent    Aggravating Factors  using arm    Pain Relieving Factors rest              [facility] PT Assessment - 06/16/20 1004      Assessment   Medical Diagnosis R RTC repair    Referring Provider (PT) Meredith Pel MD    Onset Date/Surgical Date 05/15/20      Observation/Other Assessments   Focus on Therapeutic Outcomes (FOTO)  45 (predicted 52)                         [facility] Adult PT Treatment/Exercise - 06/16/20 0934      Shoulder Exercises: Pulleys   Flexion 3 minutes    Scaption 3 minutes      Shoulder Exercises: Isometric Strengthening   Flexion Limitations 10x5"    Extension Limitations 10x5"    External Rotation Limitations 10x5"    Internal Rotation Limitations 10x5"    ABduction Limitations 10x5"      Modalities   Modalities Vasopneumatic      Vasopneumatic   Number Minutes Vasopneumatic  10 minutes    Vasopnuematic Location  Shoulder    Vasopneumatic Pressure Low    Vasopneumatic Temperature  34      Manual Therapy   Manual Therapy Joint mobilization;Passive ROM    Joint Mobilization G1-2 A/P and inf Rt shoulder    Passive ROM Rt shoulder in supine all motions to tolerance                       PT Long Term Goals - 06/16/20 1004      PT LONG TERM GOAL #1   Title Waneda will have an improved FOTO score at DC.    Baseline COMPLETE VISIT 2    Time 9    Period Weeks    Status On-going    Target Date 08/11/20      PT LONG TERM GOAL #2   Title Improve R shoulder AROM for flexion to 170 degrees; ER to 90; IR to 60 and horizontal adduction to 40.    Baseline See objective.    Time 9    Period Weeks    Status On-going    Target Date 08/11/20      PT LONG TERM GOAL #3   Title Improve R shoulder strength to 4-/5 MMT for ER and IR    Baseline Deferred due to < 4 weeks post-surgery.  90% strength expected 9-12 months post-surgery.    Time 9    Period Weeks    Status On-going    Target  Date 08/11/20      PT LONG TERM GOAL #4   Title Shanee will be independent with her long-term HEP at DC.    Time 9    Period Weeks    Status On-going    Target Date 08/11/20                 Plan - 06/16/20 1005    Clinical Impression Statement Pt tolerated session well today, demonstrating near full PROM at this time.  Isometrics casue expected post-op soreness but overall progressing well.  Will continue to benefit from PT to maximize function.    Personal Factors and Comorbidities Comorbidity 1    Comorbidities Multiple back surgeries    Examination-Activity Limitations Dressing;Sleep;Lift;Carry;Reach Overhead    Examination-Participation Restrictions Occupation;Community Activity    Stability/Clinical Decision Making Stable/Uncomplicated    Rehab Potential Good    PT Frequency 2x / week    PT Duration Other (comment)   9 weeks   PT Treatment/Interventions ADLs/Self Care Home Management;Cryotherapy;Therapeutic activities;Therapeutic exercise;Neuromuscular re-education;Patient/family education;Manual techniques;Passive range of motion;Vasopneumatic Device    PT Next Visit Plan PROM until 6 weeks post-op, isometrics    PT Home Exercise Plan Access Code: [email or web address]    Consulted and Agree with Plan of Care Patient           Patient will benefit from skilled therapeutic intervention in order to improve the following deficits and impairments:  Decreased activity tolerance, Decreased endurance, Decreased range of motion, Decreased strength, Hypomobility, Impaired UE functional use, Pain, Increased edema  Visit Diagnosis: Muscle weakness (generalized)  Stiffness of left shoulder, not elsewhere classified  Chronic left shoulder pain  Localized edema     Problem List Patient Active Problem List   Diagnosis Date Noted  . Essential hypertension 04/04/2015  . Hyperlipemia 04/04/2015  . Irritable bowel syndrome 04/04/2015  . Diabetes ([place]) 11/29/2014      Laureen Abrahams, PT, DPT 06/16/20 10:12 AM     [facility] Physical Therapy [address] [place], [place], [address] Phone: [phone]   Fax:  [phone]  Name: Rai Sinagra  [place]ary MRN: [id number] Date of Birth: 09-01-1955

## 2020-06-19 ENCOUNTER — Ambulatory Visit (INDEPENDENT_AMBULATORY_CARE_PROVIDER_SITE_OTHER): Payer: No Typology Code available for payment source | Admitting: Physical Therapy

## 2020-06-19 ENCOUNTER — Other Ambulatory Visit: Payer: Self-pay

## 2020-06-19 ENCOUNTER — Encounter: Payer: Self-pay | Admitting: Physical Therapy

## 2020-06-19 DIAGNOSIS — M25512 Pain in left shoulder: Secondary | ICD-10-CM | POA: Diagnosis not present

## 2020-06-19 DIAGNOSIS — M6281 Muscle weakness (generalized): Secondary | ICD-10-CM

## 2020-06-19 DIAGNOSIS — M25612 Stiffness of left shoulder, not elsewhere classified: Secondary | ICD-10-CM

## 2020-06-19 DIAGNOSIS — G8929 Other chronic pain: Secondary | ICD-10-CM

## 2020-06-19 DIAGNOSIS — R600 Localized edema: Secondary | ICD-10-CM

## 2020-06-19 NOTE — Therapy (Signed)
[facility] Physical Therapy [address] [place], [place], [address] Phone: [phone]   Fax:  [phone]  Physical Therapy Treatment  Patient Details  Name: Heather Stout MRN: [id number] Date of Birth: [birth year] Referring Provider (PT): Meredith Pel MD   Encounter Date: 06/19/2020   PT End of Session - 06/19/20 1410    Visit Number 4    Number of Visits 18    PT Start Time 7782    PT Stop Time 4235    PT Time Calculation (min) 40 min    Activity Tolerance Patient tolerated treatment well;No increased pain    Behavior During Therapy WFL for tasks assessed/performed           Past Medical History:  Diagnosis Date  . Asthma   . Essential hypertension, benign   . GERD (gastroesophageal reflux disease)   . IBS (irritable bowel syndrome)   . Left anterior hemiblock   . Other and unspecified hyperlipidemia   . Sleep apnea    cpap  . Type II or unspecified type diabetes mellitus without mention of complication, not stated as uncontrolled     Past Surgical History:  Procedure Laterality Date  . FUNCTIONAL ENDOSCOPIC SINUS SURGERY  1990  . LUMBAR LAMINECTOMY  2002  . LUMBAR LAMINECTOMY  2005  . SHOULDER ARTHROSCOPY WITH OPEN ROTATOR CUFF REPAIR Right 05/15/2020   Procedure: right shoulder arthroscopy, debridement, mini open rotator cuff tear repair, biceps tenodesis;  Surgeon: Meredith Pel, MD;  Location: [place];  Service: Orthopedics;  Laterality: Right;  . TONSILLECTOMY  1980    There were no vitals filed for this visit.   Subjective Assessment - 06/19/20 1406    Subjective Pt arriving stating her R shoulder is a little more sore today due to today being her first day back at work.    Patient Stated Goals regain use of arm    Currently in Pain? Yes    Pain Score 2     Pain Location Shoulder    Pain Orientation Right    Pain Descriptors / Indicators Aching    Pain Type Acute pain;Surgical pain    Pain Onset  1 to 4 weeks ago                             [facility] Adult PT Treatment/Exercise - 06/19/20 0001      Exercises   Exercises Shoulder      Lumbar Exercises: Stretches   Active Hamstring Stretch --      Shoulder Exercises: Pulleys   Flexion 3 minutes    Scaption 3 minutes      Shoulder Exercises: Isometric Strengthening   Flexion Limitations 10x5"    Extension Limitations 10x5"    External Rotation Limitations 10x5"    Internal Rotation Limitations 10x5"    ABduction Limitations 10x5"      Shoulder Exercises: Stretch   Table Stretch - External Rotation 2 reps;10 seconds      Vasopneumatic   Number Minutes Vasopneumatic  10 minutes    Vasopnuematic Location  Shoulder    Vasopneumatic Pressure Low    Vasopneumatic Temperature  34      Manual Therapy   Manual Therapy Joint mobilization;Passive ROM    Manual therapy comments soft tissue mobs active trigger point release to R upper trap and medial sacpuar border. soft tissue mobs using a tennis ball with pt instructions    Joint Mobilization G1-2  A/P and inf Rt shoulder    Passive ROM Rt shoulder in supine all motions to tolerance                  PT Education - 06/19/20 1408    Education Details Discussion on precautions while shoulder is healing along with PT POC to start more AAROM per protocol.    Person(s) Educated Patient    Methods Explanation    Comprehension Verbalized understanding               PT Long Term Goals - 06/19/20 1426      PT LONG TERM GOAL #1   Title Genasis will have an improved FOTO score at DC.    Status On-going      PT LONG TERM GOAL #2   Title Improve R shoulder AROM for flexion to 170 degrees; ER to 90; IR to 60 and horizontal adduction to 40.    Status On-going      PT LONG TERM GOAL #3   Title Improve R shoulder strength to 4-/5 MMT for ER and IR    Baseline Deferred due to < 4 weeks post-surgery.  90% strength expected 9-12 months post-surgery.     Status On-going      PT LONG TERM GOAL #4   Title Zylie will be independent with her long-term HEP at DC.    Status On-going                 Plan - 06/19/20 1412    Clinical Impression Statement Pt arriving to therapy and tolerating well. Pt able to demonstrate full PROM with mild pain reported. Pt tender with palpation of her R upper trap and medial scapular border with active trigger points noted. Pt was instructed in self soft tissue mobs using a tennis ball. Will continue to benefit from skilled PT to progress toward pt's PLOF.    Personal Factors and Comorbidities Comorbidity 1    Comorbidities Multiple back surgeries    Examination-Activity Limitations Dressing;Sleep;Lift;Carry;Reach Overhead    Examination-Participation Restrictions Occupation;Community Activity    Stability/Clinical Decision Making Stable/Uncomplicated    Rehab Potential Good    PT Frequency 2x / week    PT Duration Other (comment)    PT Treatment/Interventions ADLs/Self Care Home Management;Cryotherapy;Therapeutic activities;Therapeutic exercise;Neuromuscular re-education;Patient/family education;Manual techniques;Passive range of motion;Vasopneumatic Device    PT Next Visit Plan PROM until 6 weeks post-op, isometrics    PT Home Exercise Plan Access Code: [email or web address]    Consulted and Agree with Plan of Care Patient           Patient will benefit from skilled therapeutic intervention in order to improve the following deficits and impairments:  Decreased activity tolerance, Decreased endurance, Decreased range of motion, Decreased strength, Hypomobility, Impaired UE functional use, Pain, Increased edema  Visit Diagnosis: Muscle weakness (generalized)  Stiffness of left shoulder, not elsewhere classified  Chronic left shoulder pain  Localized edema     Problem List Patient Active Problem List   Diagnosis Date Noted  . Essential hypertension 04/04/2015  . Hyperlipemia 04/04/2015  . Irritable  bowel syndrome 04/04/2015  . Diabetes ([place]) 11/29/2014    [name], PT, MPT 06/19/2020, 2:47 PM  [facility] Physical Therapy [address] [place], [place], [address] Phone: [phone]   Fax:  [phone]  Name: ANELLY SAMARIN MRN: [id number] Date of Birth: 19-Nov-1955

## 2020-06-21 MED FILL — FREESTYLE LIBRE 14 DAY SENS: 28 days supply | Qty: 2 | Fill #1

## 2020-06-21 MED FILL — FENOFIBRATE 145 MG TABS: 145 | 90 days supply | Qty: 90 | Fill #1

## 2020-06-21 MED FILL — OMEPRAZOLE 40 MG CPDR: 40 | 30 days supply | Qty: 60 | Fill #4

## 2020-06-21 MED FILL — TRESIBA FLEXTOUCH 200 UNITS: 200 | 30 days supply | Qty: 6 | Fill #4

## 2020-06-22 ENCOUNTER — Encounter: Payer: No Typology Code available for payment source | Admitting: Rehabilitative and Restorative Service Providers"

## 2020-06-26 ENCOUNTER — Ambulatory Visit (INDEPENDENT_AMBULATORY_CARE_PROVIDER_SITE_OTHER): Payer: No Typology Code available for payment source | Admitting: Physical Therapy

## 2020-06-26 ENCOUNTER — Other Ambulatory Visit: Payer: Self-pay

## 2020-06-26 DIAGNOSIS — M6281 Muscle weakness (generalized): Secondary | ICD-10-CM | POA: Diagnosis not present

## 2020-06-26 DIAGNOSIS — R600 Localized edema: Secondary | ICD-10-CM | POA: Diagnosis not present

## 2020-06-26 DIAGNOSIS — M25612 Stiffness of left shoulder, not elsewhere classified: Secondary | ICD-10-CM | POA: Diagnosis not present

## 2020-06-26 DIAGNOSIS — G8929 Other chronic pain: Secondary | ICD-10-CM

## 2020-06-26 DIAGNOSIS — M25512 Pain in left shoulder: Secondary | ICD-10-CM

## 2020-06-26 NOTE — Therapy (Signed)
[facility] Physical Therapy [address] [place], [place], [address] Phone: [phone]   Fax:  [phone]  Physical Therapy Treatment  Patient Details  Name: Heather Stout MRN: [id number] Date of Birth: [birth year] Referring Provider (PT): Meredith Pel MD   Encounter Date: 06/26/2020   PT End of Session - 06/26/20 1621    Visit Number 5    Number of Visits 18    PT Start Time 9833    PT Stop Time 1630    PT Time Calculation (min) 44 min    Activity Tolerance Patient tolerated treatment well;No increased pain    Behavior During Therapy WFL for tasks assessed/performed           Past Medical History:  Diagnosis Date  . Asthma   . Essential hypertension, benign   . GERD (gastroesophageal reflux disease)   . IBS (irritable bowel syndrome)   . Left anterior hemiblock   . Other and unspecified hyperlipidemia   . Sleep apnea    cpap  . Type II or unspecified type diabetes mellitus without mention of complication, not stated as uncontrolled     Past Surgical History:  Procedure Laterality Date  . FUNCTIONAL ENDOSCOPIC SINUS SURGERY  1990  . LUMBAR LAMINECTOMY  2002  . LUMBAR LAMINECTOMY  2005  . SHOULDER ARTHROSCOPY WITH OPEN ROTATOR CUFF REPAIR Right 05/15/2020   Procedure: right shoulder arthroscopy, debridement, mini open rotator cuff tear repair, biceps tenodesis;  Surgeon: Meredith Pel, MD;  Location: [place];  Service: Orthopedics;  Laterality: Right;  . TONSILLECTOMY  1980    There were no vitals filed for this visit.   Subjective Assessment - 06/26/20 1552    Subjective she relays her shoulder feels pretty good upon arrival.    Limitations Other (comment)   Any R UE use   Patient Stated Goals regain use of arm    Pain Onset 1 to 4 weeks ago              [facility] PT Assessment - 06/26/20 0001      Assessment   Medical Diagnosis R RTC repair    Referring Provider (PT) Meredith Pel MD    Onset  Date/Surgical Date 05/15/20      AROM   Right Shoulder Flexion 145 Degrees    Right Shoulder ABduction 145 Degrees    Right Shoulder Internal Rotation --   75% of other side   Right Shoulder External Rotation --   [facility]                        [facility] Adult PT Treatment/Exercise - 06/26/20 0001      Shoulder Exercises: Standing   External Rotation Right;20 reps    Theraband Level (Shoulder External Rotation) Level 2 (Red)    Internal Rotation Right;20 reps    Theraband Level (Shoulder Internal Rotation) Level 2 (Red)    Extension Both;20 reps    Theraband Level (Shoulder Extension) Level 2 (Red)    Row Both;20 reps    Theraband Level (Shoulder Row) Level 2 (Red)    Other Standing Exercises wall ladder 5 sec hold X 10 reps flexion and scaption    Other Standing Exercises bicep curl 2 lbs X 20 reps      Shoulder Exercises: Pulleys   Flexion 2 minutes    Scaption 2 minutes      Shoulder Exercises: ROM/Strengthening   UBE (Upper Arm Bike) L2 2 min  fwd/ 2 min retro      Shoulder Exercises: Stretch   Other Shoulder Stretches ER stretch in doorway 10 sec X 10 reps    Other Shoulder Stretches IR stretch with towel behind back 5 sec X 15 reps      Vasopneumatic   Number Minutes Vasopneumatic  10 minutes    Vasopnuematic Location  Shoulder    Vasopneumatic Pressure Medium    Vasopneumatic Temperature  34                  PT Education - 06/26/20 1621    Education Details HEP progression    Person(s) Educated Patient    Methods Explanation;Demonstration;Verbal cues;Handout    Comprehension Verbalized understanding               PT Long Term Goals - 06/19/20 1426      PT LONG TERM GOAL #1   Title Heather Stout will have an improved FOTO score at DC.    Status On-going      PT LONG TERM GOAL #2   Title Improve R shoulder AROM for flexion to 170 degrees; ER to 90; IR to 60 and horizontal adduction to 40.    Status On-going      PT LONG TERM GOAL #3    Title Improve R shoulder strength to 4-/5 MMT for ER and IR    Baseline Deferred due to < 4 weeks post-surgery.  90% strength expected 9-12 months post-surgery.    Status On-going      PT LONG TERM GOAL #4   Title Heather Stout will be independent with her long-term HEP at DC.    Status On-going                 Plan - 06/26/20 1608    Clinical Impression Statement She is now 6 weeks post op so progressed to light RTC with good tolerance and without complaints. Her HEP was progressed to now add strengthening. Continue POC    Personal Factors and Comorbidities Comorbidity 1    Comorbidities Multiple back surgeries    Examination-Activity Limitations Dressing;Sleep;Lift;Carry;Reach Overhead    Examination-Participation Restrictions Occupation;Community Activity    Stability/Clinical Decision Making Stable/Uncomplicated    Rehab Potential Good    PT Frequency 2x / week    PT Duration Other (comment)    PT Treatment/Interventions ADLs/Self Care Home Management;Cryotherapy;Therapeutic activities;Therapeutic exercise;Neuromuscular re-education;Patient/family education;Manual techniques;Passive range of motion;Vasopneumatic Device    PT Next Visit Plan PROM until 6 weeks post-op, isometrics    PT Home Exercise Plan Access Code: [email or web address]    Consulted and Agree with Plan of Care Patient           Patient will benefit from skilled therapeutic intervention in order to improve the following deficits and impairments:  Decreased activity tolerance, Decreased endurance, Decreased range of motion, Decreased strength, Hypomobility, Impaired UE functional use, Pain, Increased edema  Visit Diagnosis: Muscle weakness (generalized)  Stiffness of left shoulder, not elsewhere classified  Chronic left shoulder pain  Localized edema     Problem List Patient Active Problem List   Diagnosis Date Noted  . Essential hypertension 04/04/2015  . Hyperlipemia 04/04/2015  . Irritable bowel syndrome  04/04/2015  . Diabetes ([place]) 11/29/2014    Heather Stout 06/26/2020, 4:22 PM  [facility] Physical Therapy [address] [place], [place], [address] Phone: [phone]   Fax:  [phone]  Name: Heather Stout MRN: [id number] Date of Birth: 07-Apr-1956

## 2020-06-26 NOTE — Patient Instructions (Signed)
Access Code: [email or web address] URL: [email or web address] Date: 06/26/2020 Prepared by: Elsie Ra  Exercises Scaption Wall Slide with Towel - 2 x daily - 6 x weekly - 1-2 sets - 10 reps - 5 sec hold Standing Shoulder Internal Rotation Stretch with Towel - 2 x daily - 6 x weekly - 10 reps - 1 sets - 10 sec hold Standing Row with Anchored Resistance - 2 x daily - 6 x weekly - 10-20 reps - 2-3 sets Shoulder extension with resistance - Neutral - 2 x daily - 6 x weekly - 10 reps - 2-3 sets Shoulder External Rotation with Anchored Resistance - 2 x daily - 6 x weekly - 10 reps - 2-3 sets Shoulder Internal Rotation with Resistance - 2 x daily - 6 x weekly - 10 reps - 2-3 sets Shoulder Abduction - Thumbs Up - 2 x daily - 6 x weekly - 2-3 sets - 10 reps

## 2020-06-28 ENCOUNTER — Ambulatory Visit (INDEPENDENT_AMBULATORY_CARE_PROVIDER_SITE_OTHER): Payer: No Typology Code available for payment source | Admitting: Physical Therapy

## 2020-06-28 ENCOUNTER — Other Ambulatory Visit: Payer: Self-pay

## 2020-06-28 DIAGNOSIS — M6281 Muscle weakness (generalized): Secondary | ICD-10-CM | POA: Diagnosis not present

## 2020-06-28 DIAGNOSIS — M25512 Pain in left shoulder: Secondary | ICD-10-CM

## 2020-06-28 DIAGNOSIS — M25612 Stiffness of left shoulder, not elsewhere classified: Secondary | ICD-10-CM

## 2020-06-28 DIAGNOSIS — G8929 Other chronic pain: Secondary | ICD-10-CM

## 2020-06-28 DIAGNOSIS — R600 Localized edema: Secondary | ICD-10-CM

## 2020-06-28 NOTE — Therapy (Signed)
[facility] Physical Therapy [address] [place], [place], [address] Phone: [phone]   Fax:  [phone]  Physical Therapy Treatment  Patient Details  Name: Heather Stout MRN: [id number] Date of Birth: December 10, 1955 Referring Provider (PT): Meredith Pel MD   Encounter Date: 06/28/2020   PT End of Session - 06/28/20 1636    Visit Number 6    Number of Visits 18    PT Start Time 6387    PT Stop Time 5643    PT Time Calculation (min) 38 min    Activity Tolerance Patient tolerated treatment well;No increased pain    Behavior During Therapy WFL for tasks assessed/performed           Past Medical History:  Diagnosis Date  . Asthma   . Essential hypertension, benign   . GERD (gastroesophageal reflux disease)   . IBS (irritable bowel syndrome)   . Left anterior hemiblock   . Other and unspecified hyperlipidemia   . Sleep apnea    cpap  . Type II or unspecified type diabetes mellitus without mention of complication, not stated as uncontrolled     Past Surgical History:  Procedure Laterality Date  . FUNCTIONAL ENDOSCOPIC SINUS SURGERY  1990  . LUMBAR LAMINECTOMY  2002  . LUMBAR LAMINECTOMY  2005  . SHOULDER ARTHROSCOPY WITH OPEN ROTATOR CUFF REPAIR Right 05/15/2020   Procedure: right shoulder arthroscopy, debridement, mini open rotator cuff tear repair, biceps tenodesis;  Surgeon: Meredith Pel, MD;  Location: [place];  Service: Orthopedics;  Laterality: Right;  . TONSILLECTOMY  1980    There were no vitals filed for this visit.   Subjective Assessment - 06/28/20 1624    Subjective she relays her shoulder was really sore last time but it feels better now    Limitations Other (comment)   Any R UE use   Patient Stated Goals regain use of arm    Pain Onset 1 to 4 weeks ago            [facility] Adult PT Treatment/Exercise - 06/28/20 0001      Shoulder Exercises: Supine   External Rotation AAROM;Right;15 reps    External  Rotation Weight (lbs) 2    Flexion AAROM;15 reps    Shoulder Flexion Weight (lbs) 2    ABduction AAROM;15 reps    Shoulder ABduction Weight (lbs) 2      Shoulder Exercises: Sidelying   External Rotation Right    External Rotation Limitations 2 sets of 10    Flexion Right;10 reps    ABduction Right;10 reps      Shoulder Exercises: Standing   External Rotation Right;15 reps    Theraband Level (Shoulder External Rotation) Level 2 (Red);Level 1 (Yellow)    Internal Rotation Right;15 reps    Theraband Level (Shoulder Internal Rotation) Level 1 (Yellow)    Extension Both;15 reps    Theraband Level (Shoulder Extension) Level 2 (Red)    Row Both;15 reps    Theraband Level (Shoulder Row) Level 2 (Red)      Shoulder Exercises: Pulleys   Flexion 2 minutes    Scaption 2 minutes      Shoulder Exercises: ROM/Strengthening   UBE (Upper Arm Bike) no resitance 2 min fwd, 2 min retro      Shoulder Exercises: Stretch   Other Shoulder Stretches ER stretch in doorway 10 sec X 10 reps    Other Shoulder Stretches IR stretch with towel behind back 5 sec X 15 reps  PT Long Term Goals - 06/19/20 1426      PT LONG TERM GOAL #1   Title Heather Stout will have an improved FOTO score at DC.    Status On-going      PT LONG TERM GOAL #2   Title Improve R shoulder AROM for flexion to 170 degrees; ER to 90; IR to 60 and horizontal adduction to 40.    Status On-going      PT LONG TERM GOAL #3   Title Improve R shoulder strength to 4-/5 MMT for ER and IR    Baseline Deferred due to < 4 weeks post-surgery.  90% strength expected 9-12 months post-surgery.    Status On-going      PT LONG TERM GOAL #4   Title Heather Stout will be independent with her long-term HEP at DC.    Status On-going                 Plan - 06/28/20 1638    Clinical Impression Statement She was very sore after last session so her exercises and strengthening was backed down some to accomodate for  this. Continue POC    Personal Factors and Comorbidities Comorbidity 1    Comorbidities Multiple back surgeries    Examination-Activity Limitations Dressing;Sleep;Lift;Carry;Reach Overhead    Examination-Participation Restrictions Occupation;Community Activity    Stability/Clinical Decision Making Stable/Uncomplicated    Rehab Potential Good    PT Frequency 2x / week    PT Duration Other (comment)    PT Treatment/Interventions ADLs/Self Care Home Management;Cryotherapy;Therapeutic activities;Therapeutic exercise;Neuromuscular re-education;Patient/family education;Manual techniques;Passive range of motion;Vasopneumatic Device    PT Next Visit Plan now past 6 weeks post op so can strengthen as tolerated    PT Home Exercise Plan Access Code: [email or web address]    Consulted and Agree with Plan of Care Patient           Patient will benefit from skilled therapeutic intervention in order to improve the following deficits and impairments:  Decreased activity tolerance, Decreased endurance, Decreased range of motion, Decreased strength, Hypomobility, Impaired UE functional use, Pain, Increased edema  Visit Diagnosis: Muscle weakness (generalized)  Stiffness of left shoulder, not elsewhere classified  Chronic left shoulder pain  Localized edema     Problem List Patient Active Problem List   Diagnosis Date Noted  . Essential hypertension 04/04/2015  . Hyperlipemia 04/04/2015  . Irritable bowel syndrome 04/04/2015  . Diabetes ([place]) 11/29/2014    Debbe Odea, PT,DPT 06/28/2020, 4:39 PM  [facility] Physical Therapy [address] [place], [place], [address] Phone: [phone]   Fax:  [phone]  Name: Heather Stout MRN: [id number] Date of Birth: 05-15-1956

## 2020-07-03 ENCOUNTER — Ambulatory Visit (INDEPENDENT_AMBULATORY_CARE_PROVIDER_SITE_OTHER): Payer: No Typology Code available for payment source | Admitting: Physical Therapy

## 2020-07-03 ENCOUNTER — Other Ambulatory Visit: Payer: Self-pay

## 2020-07-03 ENCOUNTER — Encounter: Payer: Self-pay | Admitting: Physical Therapy

## 2020-07-03 DIAGNOSIS — R600 Localized edema: Secondary | ICD-10-CM | POA: Diagnosis not present

## 2020-07-03 DIAGNOSIS — M6281 Muscle weakness (generalized): Secondary | ICD-10-CM

## 2020-07-03 DIAGNOSIS — G8929 Other chronic pain: Secondary | ICD-10-CM

## 2020-07-03 DIAGNOSIS — M25512 Pain in left shoulder: Secondary | ICD-10-CM | POA: Diagnosis not present

## 2020-07-03 DIAGNOSIS — M25612 Stiffness of left shoulder, not elsewhere classified: Secondary | ICD-10-CM | POA: Diagnosis not present

## 2020-07-03 NOTE — Therapy (Signed)
[facility] Physical Therapy [address] [place], [place], [address] Phone: [phone]   Fax:  [phone]  Physical Therapy Treatment  Patient Details  Name: Heather Stout MRN: [id number] Date of Birth: July 11, 1956 Referring Provider (PT): Heather Pel MD   Encounter Date: 07/03/2020   PT End of Session - 07/03/20 1400    Visit Number 7    Number of Visits 18    PT Start Time 6644    PT Stop Time 0347    PT Time Calculation (min) 35 min    Activity Tolerance Patient tolerated treatment well;No increased pain    Behavior During Therapy WFL for tasks assessed/performed           Past Medical History:  Diagnosis Date  . Asthma   . Essential hypertension, benign   . GERD (gastroesophageal reflux disease)   . IBS (irritable bowel syndrome)   . Left anterior hemiblock   . Other and unspecified hyperlipidemia   . Sleep apnea    cpap  . Type II or unspecified type diabetes mellitus without mention of complication, not stated as uncontrolled     Past Surgical History:  Procedure Laterality Date  . FUNCTIONAL ENDOSCOPIC SINUS SURGERY  1990  . LUMBAR LAMINECTOMY  2002  . LUMBAR LAMINECTOMY  2005  . SHOULDER ARTHROSCOPY WITH OPEN ROTATOR CUFF REPAIR Right 05/15/2020   Procedure: right shoulder arthroscopy, debridement, mini open rotator cuff tear repair, biceps tenodesis;  Surgeon: Heather Pel, MD;  Location: [place];  Service: Orthopedics;  Laterality: Right;  . TONSILLECTOMY  1980    There were no vitals filed for this visit.   Subjective Assessment - 07/03/20 1357    Subjective Pt arriving to therapy reporting no pain, but reporting pain still with certain movments and fatigue with activities.    Limitations Other (comment)    Patient Stated Goals regain use of arm    Currently in Pain? No/denies                             [facility] Adult PT Treatment/Exercise - 07/03/20 0001      Shoulder  Exercises: Standing   External Rotation Right;15 reps    Theraband Level (Shoulder External Rotation) Level 2 (Red);Level 1 (Yellow)    Internal Rotation Right;15 reps    Theraband Level (Shoulder Internal Rotation) Level 1 (Yellow)    Extension Both;15 reps    Theraband Level (Shoulder Extension) Level 2 (Red)    Row Both;15 reps    Theraband Level (Shoulder Row) Level 2 (Red)    Other Standing Exercises wall push ups x 10      Shoulder Exercises: Pulleys   Flexion 3 minutes    Scaption 3 minutes      Shoulder Exercises: ROM/Strengthening   UBE (Upper Arm Bike) Level 1.0, 2 min fwd, 2 min retro                       PT Long Term Goals - 07/03/20 1414      PT LONG TERM GOAL #1   Title Heather Stout will have an improved FOTO score at DC.    Status On-going      PT LONG TERM GOAL #2   Title Improve R shoulder AROM for flexion to 170 degrees; ER to 90; IR to 60 and horizontal adduction to 40.    Status On-going      PT  LONG TERM GOAL #3   Title Improve R shoulder strength to 4-/5 MMT for ER and IR    Baseline Deferred due to < 4 weeks post-surgery.  90% strength expected 9-12 months post-surgery.    Status On-going      PT LONG TERM GOAL #4   Title Heather Stout will be independent with her long-term HEP at DC.    Period Weeks    Status On-going                 Plan - 07/03/20 1406    Clinical Impression Statement Pt arriving to therapy reporting no pain. Pt tolerating exericses well with no increased pain reported only fatigue. Continue skille PT progressing toward goals set.    Personal Factors and Comorbidities Comorbidity 1    Comorbidities Multiple back surgeries    Examination-Activity Limitations Dressing;Sleep;Lift;Carry;Reach Overhead    Examination-Participation Restrictions Occupation;Community Activity    Stability/Clinical Decision Making Stable/Uncomplicated    Rehab Potential Good    PT Frequency 2x / week    PT Duration Other (comment)    PT  Treatment/Interventions ADLs/Self Care Home Management;Cryotherapy;Therapeutic activities;Therapeutic exercise;Neuromuscular re-education;Patient/family education;Manual techniques;Passive range of motion;Vasopneumatic Device    PT Next Visit Plan now past 6 weeks post op so can strengthen as tolerated    PT Home Exercise Plan Access Code: [email or web address]           Patient will benefit from skilled therapeutic intervention in order to improve the following deficits and impairments:  Decreased activity tolerance, Decreased endurance, Decreased range of motion, Decreased strength, Hypomobility, Impaired UE functional use, Pain, Increased edema  Visit Diagnosis: Muscle weakness (generalized)  Stiffness of left shoulder, not elsewhere classified  Chronic left shoulder pain  Localized edema     Problem List Patient Active Problem List   Diagnosis Date Noted  . Essential hypertension 04/04/2015  . Hyperlipemia 04/04/2015  . Irritable bowel syndrome 04/04/2015  . Diabetes ([place]) 11/29/2014    [name] 07/03/2020, 2:27 PM  [facility] Physical Therapy [address] [place], [place], [address] Phone: [phone]   Fax:  [phone]  Name: Heather Stout MRN: [id number] Date of Birth: [birth year]

## 2020-07-05 ENCOUNTER — Encounter: Payer: No Typology Code available for payment source | Admitting: Physical Therapy

## 2020-07-10 ENCOUNTER — Other Ambulatory Visit: Payer: Self-pay

## 2020-07-10 ENCOUNTER — Ambulatory Visit (INDEPENDENT_AMBULATORY_CARE_PROVIDER_SITE_OTHER): Payer: No Typology Code available for payment source | Admitting: Physical Therapy

## 2020-07-10 DIAGNOSIS — M6281 Muscle weakness (generalized): Secondary | ICD-10-CM | POA: Diagnosis not present

## 2020-07-10 DIAGNOSIS — M25611 Stiffness of right shoulder, not elsewhere classified: Secondary | ICD-10-CM

## 2020-07-10 DIAGNOSIS — G8929 Other chronic pain: Secondary | ICD-10-CM

## 2020-07-10 DIAGNOSIS — M25511 Pain in right shoulder: Secondary | ICD-10-CM | POA: Diagnosis not present

## 2020-07-10 DIAGNOSIS — R600 Localized edema: Secondary | ICD-10-CM

## 2020-07-10 NOTE — Therapy (Signed)
[facility] Physical Therapy [address] [place], [place], [address] Phone: [phone]   Fax:  [phone]  Physical Therapy Treatment  Patient Details  Name: Heather Stout MRN: [id number] Date of Birth: [birth year] Referring Provider (PT): Meredith Pel MD   Encounter Date: 07/10/2020   PT End of Session - 07/10/20 1421    Visit Number 8    Number of Visits 18    PT Start Time 3419    PT Stop Time 6222    PT Time Calculation (min) 35 min    Activity Tolerance Patient tolerated treatment well;No increased pain    Behavior During Therapy WFL for tasks assessed/performed           Past Medical History:  Diagnosis Date  . Asthma   . Essential hypertension, benign   . GERD (gastroesophageal reflux disease)   . IBS (irritable bowel syndrome)   . Left anterior hemiblock   . Other and unspecified hyperlipidemia   . Sleep apnea    cpap  . Type II or unspecified type diabetes mellitus without mention of complication, not stated as uncontrolled     Past Surgical History:  Procedure Laterality Date  . FUNCTIONAL ENDOSCOPIC SINUS SURGERY  1990  . LUMBAR LAMINECTOMY  2002  . LUMBAR LAMINECTOMY  2005  . SHOULDER ARTHROSCOPY WITH OPEN ROTATOR CUFF REPAIR Right 05/15/2020   Procedure: right shoulder arthroscopy, debridement, mini open rotator cuff tear repair, biceps tenodesis;  Surgeon: Meredith Pel, MD;  Location: [place];  Service: Orthopedics;  Laterality: Right;  . TONSILLECTOMY  1980    There were no vitals filed for this visit.   Subjective Assessment - 07/10/20 1403    Subjective Pt arriving to therapy reporting no pain, but still with fatigue with activities.    Limitations Other (comment)    Patient Stated Goals regain use of arm    Pain Onset 1 to 4 weeks ago              [facility] PT Assessment - 07/10/20 0001      Assessment   Medical Diagnosis R RTC repair    Referring Provider (PT) Meredith Pel MD     Onset Date/Surgical Date 05/15/20      ROM / Strength   AROM / PROM / Strength Strength      AROM   Right Shoulder Flexion 150 Degrees    Right Shoulder ABduction 155 Degrees    Right Shoulder Internal Rotation --   90% arm behind back compared to Lt   Right Shoulder External Rotation --   WNL     Strength   Strength Assessment Site Shoulder    Right/Left Shoulder Right    Right Shoulder Flexion 4/5    Right Shoulder ABduction 4+/5    Right Shoulder Internal Rotation 5/5    Right Shoulder External Rotation 4+/5            [facility] Adult PT Treatment/Exercise - 07/10/20 0001      Shoulder Exercises: Standing   External Rotation Right;20 reps    Theraband Level (Shoulder External Rotation) Level 2 (Red)    Internal Rotation Right;20 reps    Theraband Level (Shoulder Internal Rotation) Level 2 (Red)    Extension Both;20 reps    Theraband Level (Shoulder Extension) Level 2 (Red)    Row Both;20 reps    Theraband Level (Shoulder Row) Level 2 (Red)    Other Standing Exercises wall push ups x 15  Other Standing Exercises OH stability ball rolls X 10 all planes and cirlces      Shoulder Exercises: Pulleys   Flexion 2 minutes    Scaption 2 minutes      Shoulder Exercises: ROM/Strengthening   UBE (Upper Arm Bike) Level 2.0, 2 min fwd, 2 min retro    Ranger 2# 10 reps ea for flexion, abd, circles    Wall Wash wall ladder flexion 10 reps holding 5 sec                       PT Long Term Goals - 07/03/20 1414      PT LONG TERM GOAL #1   Title Heather Stout will have an improved FOTO score at DC.    Status On-going      PT LONG TERM GOAL #2   Title Improve R shoulder AROM for flexion to 170 degrees; ER to 90; IR to 60 and horizontal adduction to 40.    Status On-going      PT LONG TERM GOAL #3   Title Improve R shoulder strength to 4-/5 MMT for ER and IR    Baseline Deferred due to < 4 weeks post-surgery.  90% strength expected 9-12 months post-surgery.    Status  On-going      PT LONG TERM GOAL #4   Title Heather Stout will be independent with her long-term HEP at DC.    Period Weeks    Status On-going                 Plan - 07/10/20 1422    Clinical Impression Statement Worked on Rt shoulder strength and ROM progression as tolerated. Updated measurements show progress in these areas but still with deficits and she will continue to benefit from skilled PT.    Personal Factors and Comorbidities Comorbidity 1    Comorbidities Multiple back surgeries    Examination-Activity Limitations Dressing;Sleep;Lift;Carry;Reach Overhead    Examination-Participation Restrictions Occupation;Community Activity    Stability/Clinical Decision Making Stable/Uncomplicated    Rehab Potential Good    PT Frequency 2x / week    PT Duration Other (comment)    PT Treatment/Interventions ADLs/Self Care Home Management;Cryotherapy;Therapeutic activities;Therapeutic exercise;Neuromuscular re-education;Patient/family education;Manual techniques;Passive range of motion;Vasopneumatic Device    PT Next Visit Plan strength and ROM as tolerated.    PT Home Exercise Plan Access Code: [email or web address]    Consulted and Agree with Plan of Care Patient           Patient will benefit from skilled therapeutic intervention in order to improve the following deficits and impairments:  Decreased activity tolerance, Decreased endurance, Decreased range of motion, Decreased strength, Hypomobility, Impaired UE functional use, Pain, Increased edema  Visit Diagnosis: Muscle weakness (generalized)  Chronic right shoulder pain  Stiffness of right shoulder, not elsewhere classified  Localized edema     Problem List Patient Active Problem List   Diagnosis Date Noted  . Essential hypertension 04/04/2015  . Hyperlipemia 04/04/2015  . Irritable bowel syndrome 04/04/2015  . Diabetes ([place]) 11/29/2014    Debbe Odea, PT,DPT 07/10/2020, 2:25 PM  [facility] Physical  Therapy [address] [place], [place], [address] Phone: [phone]   Fax:  [phone]  Name: Heather Stout MRN: [id number] Date of Birth: 01-20-1956

## 2020-07-12 ENCOUNTER — Ambulatory Visit (INDEPENDENT_AMBULATORY_CARE_PROVIDER_SITE_OTHER): Payer: No Typology Code available for payment source | Admitting: Physical Therapy

## 2020-07-12 ENCOUNTER — Other Ambulatory Visit: Payer: Self-pay

## 2020-07-12 DIAGNOSIS — R600 Localized edema: Secondary | ICD-10-CM | POA: Diagnosis not present

## 2020-07-12 DIAGNOSIS — M25611 Stiffness of right shoulder, not elsewhere classified: Secondary | ICD-10-CM | POA: Diagnosis not present

## 2020-07-12 DIAGNOSIS — M25511 Pain in right shoulder: Secondary | ICD-10-CM

## 2020-07-12 DIAGNOSIS — G8929 Other chronic pain: Secondary | ICD-10-CM

## 2020-07-12 DIAGNOSIS — M6281 Muscle weakness (generalized): Secondary | ICD-10-CM

## 2020-07-12 NOTE — Therapy (Signed)
[facility] Physical Therapy [address] [place], [place], [address] Phone: [phone]   Fax:  [phone]  Physical Therapy Treatment  Patient Details  Name: Heather Stout MRN: [id number] Date of Birth: 05/14/1956 Referring Provider (PT): Meredith Pel MD   Encounter Date: 07/12/2020   PT End of Session - 07/12/20 1634    Visit Number 9    Number of Visits 18    PT Start Time 1607    PT Stop Time 3710    PT Time Calculation (min) 38 min    Activity Tolerance Patient tolerated treatment well;No increased pain    Behavior During Therapy WFL for tasks assessed/performed           Past Medical History:  Diagnosis Date  . Asthma   . Essential hypertension, benign   . GERD (gastroesophageal reflux disease)   . IBS (irritable bowel syndrome)   . Left anterior hemiblock   . Other and unspecified hyperlipidemia   . Sleep apnea    cpap  . Type II or unspecified type diabetes mellitus without mention of complication, not stated as uncontrolled     Past Surgical History:  Procedure Laterality Date  . FUNCTIONAL ENDOSCOPIC SINUS SURGERY  1990  . LUMBAR LAMINECTOMY  2002  . LUMBAR LAMINECTOMY  2005  . SHOULDER ARTHROSCOPY WITH OPEN ROTATOR CUFF REPAIR Right 05/15/2020   Procedure: right shoulder arthroscopy, debridement, mini open rotator cuff tear repair, biceps tenodesis;  Surgeon: Meredith Pel, MD;  Location: [place];  Service: Orthopedics;  Laterality: Right;  . TONSILLECTOMY  1980    There were no vitals filed for this visit.   Subjective Assessment - 07/12/20 1625    Subjective relays her shoulder is improving. No pain at rest but still with pain when she reaches    Limitations Other (comment)    Patient Stated Goals regain use of arm    Pain Onset 1 to 4 weeks ago                             [facility] Adult PT Treatment/Exercise - 07/12/20 0001      Shoulder Exercises: Standing   External  Rotation Right;20 reps    Theraband Level (Shoulder External Rotation) Level 2 (Red)    Internal Rotation Right;20 reps    Theraband Level (Shoulder Internal Rotation) Level 2 (Red)    Flexion Both    Shoulder Flexion Weight (lbs) 1    Flexion Limitations 2X10 reps    ABduction Both    Shoulder ABduction Weight (lbs) 1    ABduction Limitations 2X10 reps    Extension Both;20 reps    Theraband Level (Shoulder Extension) Level 2 (Red)    Row Both;20 reps    Theraband Level (Shoulder Row) Level 2 (Red)    Other Standing Exercises wall push ups x 15    Other Standing Exercises OH stability ball rolls X 15 all planes and cirlces      Shoulder Exercises: Pulleys   Flexion 2 minutes    Scaption 2 minutes      Shoulder Exercises: ROM/Strengthening   UBE (Upper Arm Bike) Level 2.0, 2.5 min fwd, 2.5 min retro    Ranger 2# 15 reps ea for circles    Wall Wash wall ladder flexion and scaption 10 reps holding 5 sec  PT Long Term Goals - 07/03/20 1414      PT LONG TERM GOAL #1   Title Licia will have an improved FOTO score at DC.    Status On-going      PT LONG TERM GOAL #2   Title Improve R shoulder AROM for flexion to 170 degrees; ER to 90; IR to 60 and horizontal adduction to 40.    Status On-going      PT LONG TERM GOAL #3   Title Improve R shoulder strength to 4-/5 MMT for ER and IR    Baseline Deferred due to < 4 weeks post-surgery.  90% strength expected 9-12 months post-surgery.    Status On-going      PT LONG TERM GOAL #4   Title [place] will be independent with her long-term HEP at DC.    Period Weeks    Status On-going                 Plan - 07/12/20 1635    Clinical Impression Statement She was able to progress strength program today without complaints. Overall she is progressing well with PT    Personal Factors and Comorbidities Comorbidity 1    Comorbidities Multiple back surgeries    Examination-Activity Limitations  Dressing;Sleep;Lift;Carry;Reach Overhead    Examination-Participation Restrictions Occupation;Community Activity    Stability/Clinical Decision Making Stable/Uncomplicated    Rehab Potential Good    PT Frequency 2x / week    PT Duration Other (comment)    PT Treatment/Interventions ADLs/Self Care Home Management;Cryotherapy;Therapeutic activities;Therapeutic exercise;Neuromuscular re-education;Patient/family education;Manual techniques;Passive range of motion;Vasopneumatic Device    PT Next Visit Plan strength and ROM as tolerated.    PT Home Exercise Plan Access Code: [email or web address]    Consulted and Agree with Plan of Care Patient           Patient will benefit from skilled therapeutic intervention in order to improve the following deficits and impairments:  Decreased activity tolerance, Decreased endurance, Decreased range of motion, Decreased strength, Hypomobility, Impaired UE functional use, Pain, Increased edema  Visit Diagnosis: Muscle weakness (generalized)  Chronic right shoulder pain  Stiffness of right shoulder, not elsewhere classified  Localized edema     Problem List Patient Active Problem List   Diagnosis Date Noted  . Essential hypertension 04/04/2015  . Hyperlipemia 04/04/2015  . Irritable bowel syndrome 04/04/2015  . Diabetes ([place]) 11/29/2014    [name] 07/12/2020, 4:36 PM  [facility] Physical Therapy [address] [place], [place], [address] Phone: [phone]   Fax:  [phone]  Name: Heather Stout MRN: [id number] Date of Birth: 07/31/1956

## 2020-07-17 ENCOUNTER — Ambulatory Visit (INDEPENDENT_AMBULATORY_CARE_PROVIDER_SITE_OTHER): Payer: No Typology Code available for payment source | Admitting: Physical Therapy

## 2020-07-17 ENCOUNTER — Ambulatory Visit (INDEPENDENT_AMBULATORY_CARE_PROVIDER_SITE_OTHER): Payer: No Typology Code available for payment source | Admitting: Orthopedic Surgery

## 2020-07-17 ENCOUNTER — Other Ambulatory Visit (HOSPITAL_COMMUNITY): Payer: Self-pay | Admitting: Internal Medicine

## 2020-07-17 ENCOUNTER — Other Ambulatory Visit: Payer: Self-pay

## 2020-07-17 DIAGNOSIS — G8929 Other chronic pain: Secondary | ICD-10-CM

## 2020-07-17 DIAGNOSIS — R600 Localized edema: Secondary | ICD-10-CM

## 2020-07-17 DIAGNOSIS — M6281 Muscle weakness (generalized): Secondary | ICD-10-CM

## 2020-07-17 DIAGNOSIS — M25511 Pain in right shoulder: Secondary | ICD-10-CM

## 2020-07-17 DIAGNOSIS — M25612 Stiffness of left shoulder, not elsewhere classified: Secondary | ICD-10-CM

## 2020-07-17 DIAGNOSIS — M25611 Stiffness of right shoulder, not elsewhere classified: Secondary | ICD-10-CM

## 2020-07-17 DIAGNOSIS — M75121 Complete rotator cuff tear or rupture of right shoulder, not specified as traumatic: Secondary | ICD-10-CM

## 2020-07-17 DIAGNOSIS — M25512 Pain in left shoulder: Secondary | ICD-10-CM

## 2020-07-17 MED FILL — HUMALOG 100 UNITS/ML KWIKPE: 100 | 30 days supply | Qty: 18 | Fill #3

## 2020-07-17 MED FILL — TRESIBA FLEXTOUCH 200 UNITS: 200 | 30 days supply | Qty: 6 | Fill #5

## 2020-07-17 MED FILL — OMEPRAZOLE 40 MG CPDR: 40 | 30 days supply | Qty: 60 | Fill #5

## 2020-07-17 MED FILL — FREESTYLE LIBRE 14 DAY SENS: 28 days supply | Qty: 2 | Fill #2

## 2020-07-17 MED FILL — XIGDUO XR 10-500 MG TB24: 10-500 | 90 days supply | Qty: 90 | Fill #0

## 2020-07-17 NOTE — Therapy (Signed)
[facility] Physical Therapy [address] [place], [place], [address] Phone: [phone]   Fax:  [phone]  Physical Therapy Treatment/Discharge PHYSICAL THERAPY DISCHARGE SUMMARY  Visits from Start of Care: 10  Current functional level related to goals / functional outcomes: See below   Remaining deficits: See below   Education / Equipment: HEP Plan: Patient agrees to discharge.  Patient goals were met. Patient is being discharged due to meeting the stated rehab goals.  ?????       Patient Details  Name: Heather Stout MRN: [id number] Date of Birth: 1956-05-05 Referring Provider (PT): Heather Pel MD   Encounter Date: 07/17/2020   PT End of Session - 07/17/20 1402    Visit Number 10    Number of Visits 18    PT Start Time 0867    PT Stop Time 6195    PT Time Calculation (min) 32 min    Activity Tolerance Patient tolerated treatment well;No increased pain    Behavior During Therapy WFL for tasks assessed/performed           Past Medical History:  Diagnosis Date  . Asthma   . Essential hypertension, benign   . GERD (gastroesophageal reflux disease)   . IBS (irritable bowel syndrome)   . Left anterior hemiblock   . Other and unspecified hyperlipidemia   . Sleep apnea    cpap  . Type II or unspecified type diabetes mellitus without mention of complication, not stated as uncontrolled     Past Surgical History:  Procedure Laterality Date  . FUNCTIONAL ENDOSCOPIC SINUS SURGERY  1990  . LUMBAR LAMINECTOMY  2002  . LUMBAR LAMINECTOMY  2005  . SHOULDER ARTHROSCOPY WITH OPEN ROTATOR CUFF REPAIR Right 05/15/2020   Procedure: right shoulder arthroscopy, debridement, mini open rotator cuff tear repair, biceps tenodesis;  Surgeon: Heather Pel, MD;  Location: [place];  Service: Orthopedics;  Laterality: Right;  . TONSILLECTOMY  1980    There were no vitals filed for this visit.   Subjective Assessment -  07/17/20 1354    Subjective relays no pain upon arrrival, some soreness this morning but after tylenol it went away. She feels ready for discharge and can work on strengthening at home with HEP    Limitations Other (comment)    Patient Stated Goals regain use of arm    Pain Onset 1 to 4 weeks ago              [facility] PT Assessment - 07/17/20 0001      Assessment   Medical Diagnosis R RTC repair    Referring Provider (PT) Heather Pel MD    Onset Date/Surgical Date 05/15/20      AROM   Right Shoulder Flexion 155 Degrees    Right Shoulder ABduction 160 Degrees    Right Shoulder Internal Rotation --   WNL   Right Shoulder External Rotation --   WNL     Strength   Right Shoulder Flexion 4+/5    Right Shoulder Extension --    Right Shoulder ABduction 4+/5    Right Shoulder Internal Rotation 5/5    Right Shoulder External Rotation 4+/5                         [facility] Adult PT Treatment/Exercise - 07/17/20 0001      Shoulder Exercises: Standing   External Rotation Right;20 reps    Theraband Level (Shoulder External Rotation) Level 3 (  Green)    Internal Rotation 20 reps    Theraband Level (Shoulder Internal Rotation) Level 3 (Green)    Flexion Both    Shoulder Flexion Weight (lbs) 2    Flexion Limitations 2X10 reps    ABduction Both    Shoulder ABduction Weight (lbs) 2    ABduction Limitations 2X10 reps    Extension Both;20 reps    Theraband Level (Shoulder Extension) Level 3 (Green)    Row Both;20 reps    Theraband Level (Shoulder Row) Level 3 (Green)    Other Standing Exercises wall push ups x 20    Other Standing Exercises bent over Y,T,I 2# X15 ea      Shoulder Exercises: Pulleys   Flexion 2 minutes    Scaption 2 minutes      Shoulder Exercises: ROM/Strengthening   UBE (Upper Arm Bike) Level 2.0, 2.5 min fwd, 2.5 min retro                  PT Education - 07/17/20 1414    Education Details HEP progression    Person(s) Educated Patient     Methods Explanation;Demonstration;Verbal cues;Handout    Comprehension Verbalized understanding;Returned demonstration               PT Long Term Goals - 07/17/20 1430      PT LONG TERM GOAL #1   Title Heather Stout will have an improved FOTO score at DC.    Status Achieved      PT LONG TERM GOAL #2   Title Improve R shoulder AROM for flexion to 170 degrees; ER to 90; IR to 60 and horizontal adduction to 40.    Status Achieved      PT LONG TERM GOAL #3   Title Improve R shoulder strength to 4-/5 MMT for ER and IR    Baseline Deferred due to < 4 weeks post-surgery.  90% strength expected 9-12 months post-surgery.    Status Achieved      PT LONG TERM GOAL #4   Title Heather Stout will be independent with her long-term HEP at DC.    Period Weeks    Status Achieved                 Plan - 07/17/20 1427    Clinical Impression Statement She has now met her PT goals. Now has good ROM and strength. Only missing mild strength in Rt shoulder but she feels confident she can continue to improve this on her own with HEP. PT will discharge today and she had no further questions or concerns.    PT Next Visit Plan discharge today           Patient will benefit from skilled therapeutic intervention in order to improve the following deficits and impairments:     Visit Diagnosis: Muscle weakness (generalized)  Chronic right shoulder pain  Stiffness of right shoulder, not elsewhere classified  Localized edema  Stiffness of left shoulder, not elsewhere classified  Chronic left shoulder pain     Problem List Patient Active Problem List   Diagnosis Date Noted  . Essential hypertension 04/04/2015  . Hyperlipemia 04/04/2015  . Irritable bowel syndrome 04/04/2015  . Diabetes ([place]) 11/29/2014    Heather Stout 07/17/2020, 2:35 PM  [facility] Physical Therapy [address] [place], [place], [address] Phone: [phone]   Fax:  [phone]  Name:  Heather Stout MRN: [id number] Date of Birth: 1956-01-28

## 2020-07-17 NOTE — Patient Instructions (Signed)
Access Code: [email or web address] URL: [email or web address] Date: 07/17/2020 Prepared by: Elsie Ra  Exercises Scaption Wall Slide with Towel - 2 x daily - 6 x weekly - 1-2 sets - 10 reps - 5 sec hold Standing Row with Anchored Resistance - 2 x daily - 6 x weekly - 10-20 reps - 2-3 sets Shoulder extension with resistance - Neutral - 2 x daily - 6 x weekly - 10 reps - 2-3 sets Shoulder External Rotation with Anchored Resistance - 2 x daily - 6 x weekly - 10 reps - 2-3 sets Shoulder Internal Rotation with Resistance - 2 x daily - 6 x weekly - 10 reps - 2-3 sets Wall Push Up - 2 x daily - 6 x weekly - 2-3 sets - 10 reps Shoulder PNF D2 Flexion - 2 x daily - 6 x weekly - 2 sets - 10 reps Standing Shoulder Flexion to 90 Degrees with Dumbbells - 2 x daily - 6 x weekly - 10 reps - 2 sets Shoulder Abduction with Dumbbells - Thumbs Up - 2 x daily - 6 x weekly - 2 sets - 10 reps Single Arm Bent Over Shoulder Horizontal Abduction with Dumbbell - Thumb Up - 2 x daily - 6 x weekly - 3 sets - 10 reps Single Arm Bent Over Shoulder Horizontal Abduction with Dumbbell - Thumb Up - 2 x daily - 6 x weekly - 3 sets - 10 reps

## 2020-07-18 ENCOUNTER — Encounter: Payer: No Typology Code available for payment source | Admitting: Physical Therapy

## 2020-07-21 MED FILL — VIBERZI 100 MG TABS: 100 | 90 days supply | Qty: 180 | Fill #1

## 2020-07-23 ENCOUNTER — Encounter: Payer: Self-pay | Admitting: Orthopedic Surgery

## 2020-07-23 NOTE — Progress Notes (Signed)
   Post-Op Visit Note   Patient: Heather Stout           Date of Birth: 1955/09/09           MRN: [id number] Visit Date: 07/17/2020 PCP: Deland Pretty, MD   Assessment & Plan:  Chief Complaint:  Chief Complaint  Patient presents with  . Right Shoulder - Routine Post Op   Visit Diagnoses:  1. Nontraumatic complete tear of right rotator cuff     Plan: Tekla is a patient is now 8 weeks out right shoulder arthroscopy with mini open rotator cuff tear repair.  Physical therapy has been transferred to home exercise program.  She is doing resistance work with 2 pound weights.  Takes occasional muscle relaxer.  She has been working in the yard.  On exam she has excellent passive and active range of motion with good rotator cuff strength and no coarse grinding or crepitus with passive range of motion of the shoulder at 15 degrees of abduction as well as 90 degrees of abduction.  Plan at this time is to be very careful with any type of shoulder home exercise program moving forward.  Strength will continue to slowly improve over the next 3 to 4 months.  Potential for retearing discussed.  In general Rayley has done a great job of rehabilitating her shoulder.  I will see her back as needed.  Follow-Up Instructions: Return in about 4 weeks (around 08/14/2020).   Orders:  No orders of the defined types were placed in this encounter.  No orders of the defined types were placed in this encounter.   Imaging: No results found.  PMFS History: Patient Active Problem List   Diagnosis Date Noted  . Essential hypertension 04/04/2015  . Hyperlipemia 04/04/2015  . Irritable bowel syndrome 04/04/2015  . Diabetes ([place]) 11/29/2014   Past Medical History:  Diagnosis Date  . Asthma   . Essential hypertension, benign   . GERD (gastroesophageal reflux disease)   . IBS (irritable bowel syndrome)   . Left anterior hemiblock   . Other and unspecified hyperlipidemia   . Sleep apnea    cpap  . Type  II or unspecified type diabetes mellitus without mention of complication, not stated as uncontrolled     Family History  Problem Relation Age of Onset  . Hypertension Father   . Hypertension Brother   . Diabetes Brother   . Hypertension Brother   . Crohn's disease Brother   . Hypertension Sister   . Diabetes Sister     Past Surgical History:  Procedure Laterality Date  . FUNCTIONAL ENDOSCOPIC SINUS SURGERY  1990  . LUMBAR LAMINECTOMY  2002  . LUMBAR LAMINECTOMY  2005  . SHOULDER ARTHROSCOPY WITH OPEN ROTATOR CUFF REPAIR Right 05/15/2020   Procedure: right shoulder arthroscopy, debridement, mini open rotator cuff tear repair, biceps tenodesis;  Surgeon: Meredith Pel, MD;  Location: [place];  Service: Orthopedics;  Laterality: Right;  . TONSILLECTOMY  1980   Social History   Occupational History  . Occupation: Insurance underwriter  Tobacco Use  . Smoking status: Never Smoker  . Smokeless tobacco: Never Used  Substance and Sexual Activity  . Alcohol use: No    Alcohol/week: 0.0 standard drinks  . Drug use: No  . Sexual activity: Not on file

## 2020-07-24 ENCOUNTER — Ambulatory Visit (INDEPENDENT_AMBULATORY_CARE_PROVIDER_SITE_OTHER): Payer: No Typology Code available for payment source | Admitting: Orthopaedic Surgery

## 2020-07-24 ENCOUNTER — Ambulatory Visit (INDEPENDENT_AMBULATORY_CARE_PROVIDER_SITE_OTHER): Payer: No Typology Code available for payment source

## 2020-07-24 ENCOUNTER — Encounter: Payer: Self-pay | Admitting: Orthopaedic Surgery

## 2020-07-24 DIAGNOSIS — M25562 Pain in left knee: Secondary | ICD-10-CM

## 2020-07-24 MED ORDER — LIDOCAINE HCL 1 % IJ SOLN
3.0000 mL | INTRAMUSCULAR | Status: AC | PRN
  Administered 2020-07-24: 3 mL

## 2020-07-24 MED ORDER — METHYLPREDNISOLONE ACETATE 40 MG/ML IJ SUSP
40.0000 mg | INTRAMUSCULAR | Status: AC | PRN
  Administered 2020-07-24: 40 mg via INTRA_ARTICULAR

## 2020-07-24 NOTE — Progress Notes (Signed)
Office Visit Note   Patient: Heather Stout           Date of Birth: [birth year]           MRN: [id number] Visit Date: 07/24/2020              Requested by: Deland Pretty, MD [place],  [place] [address] PCP: Deland Pretty, MD   Assessment & Plan: Visit Diagnoses:  1. Left knee pain, unspecified chronicity     Plan: Given the acute nature of her left knee pain, I did recommend a steroid injection in the left knee today.  She is a diabetic and will watch her blood glucose closely because this could elevate her blood glucose.  I would like to reevaluate her in 1 week to determine whether or not a MRI would be warranted.  She may be a candidate for hyaluronic acid as well given the amount of arthritis in her knee.  All questions and concerns were answered and addressed.  Follow-Up Instructions: Return in about 1 week (around 07/31/2020).   Orders:  Orders Placed This Encounter  Procedures  . Large Joint Inj  . XR Knee 1-2 Views Left   No orders of the defined types were placed in this encounter.     Procedures: Large Joint Inj: L knee on 07/24/2020 3:22 PM Indications: diagnostic evaluation and pain Details: 22 G 1.5 in needle, superolateral approach  Arthrogram: No  Medications: 3 mL lidocaine 1 %; 40 mg methylPREDNISolone acetate 40 MG/ML Outcome: tolerated well, no immediate complications Procedure, treatment alternatives, risks and benefits explained, specific risks discussed. Consent was given by the patient. Immediately prior to procedure a time out was called to verify the correct patient, procedure, equipment, support staff and site/side marked as required. Patient was prepped and draped in the usual sterile fashion.       Clinical Data: No additional findings.   Subjective: Chief Complaint  Patient presents with  . Left Knee - Pain   Dia comes in today with worsening left knee pain.  It has been going on for about a week.  Has  been painful to walk and she points to the medial aspect of her left knee as a source of her pain.  It does also hurt with pivoting activities.  She has a remote history of a left knee arthroscopy that we did many years ago for a medial meniscal tear.  HPI  Review of Systems There is currently listed no headache, chest pain, shortness of breath, fever, chills, nausea, vomiting  Objective: Vital Signs: There were no vitals taken for this visit.  Physical Exam She is alert and orient x3 and in no acute distress Ortho Exam Examination of her left knee shows significant medial joint line tenderness.  There is no effusion.  The remainder of her knee exam is normal other than some slight patellofemoral crepitation. Specialty Comments:  No specialty comments available.  Imaging: XR Knee 1-2 Views Left  Result Date: 07/24/2020 2 views of the left knee show moderate arthritis mainly at the medial compartment of the knee with joint space narrowing and periarticular osteophytes.  There is also patellofemoral arthritic changes.    PMFS History: Patient Active Problem List   Diagnosis Date Noted  . Essential hypertension 04/04/2015  . Hyperlipemia 04/04/2015  . Irritable bowel syndrome 04/04/2015  . Diabetes ([place]) 11/29/2014   Past Medical History:  Diagnosis Date  . Asthma   . Essential hypertension, benign   .  GERD (gastroesophageal reflux disease)   . IBS (irritable bowel syndrome)   . Left anterior hemiblock   . Other and unspecified hyperlipidemia   . Sleep apnea    cpap  . Type II or unspecified type diabetes mellitus without mention of complication, not stated as uncontrolled     Family History  Problem Relation Age of Onset  . Hypertension Father   . Hypertension Brother   . Diabetes Brother   . Hypertension Brother   . Crohn's disease Brother   . Hypertension Sister   . Diabetes Sister     Past Surgical History:  Procedure Laterality Date  . FUNCTIONAL ENDOSCOPIC  SINUS SURGERY  1990  . LUMBAR LAMINECTOMY  2002  . LUMBAR LAMINECTOMY  2005  . SHOULDER ARTHROSCOPY WITH OPEN ROTATOR CUFF REPAIR Right 05/15/2020   Procedure: right shoulder arthroscopy, debridement, mini open rotator cuff tear repair, biceps tenodesis;  Surgeon: Meredith Pel, MD;  Location: [facility];  Service: Orthopedics;  Laterality: Right;  . TONSILLECTOMY  1980   Social History   Occupational History  . Occupation: Insurance underwriter  Tobacco Use  . Smoking status: Never Smoker  . Smokeless tobacco: Never Used  Substance and Sexual Activity  . Alcohol use: No    Alcohol/week: 0.0 standard drinks  . Drug use: No  . Sexual activity: Not on file

## 2020-07-26 MED FILL — OZEMPIC (1 MG/DOSE) 4 MG/3M: 4 | 28 days supply | Qty: 3 | Fill #1

## 2020-07-28 MED FILL — UNIFINE PENTIPS 31GX3/16: 31G X 5 MM | 75 days supply | Qty: 300 | Fill #2

## 2020-08-01 MED FILL — HYOSCYAMINE ER 0.375 MG TAB: 0.375 | 30 days supply | Qty: 60 | Fill #1

## 2020-08-03 ENCOUNTER — Encounter: Payer: Self-pay | Admitting: Orthopaedic Surgery

## 2020-08-03 ENCOUNTER — Ambulatory Visit: Payer: No Typology Code available for payment source | Admitting: Orthopaedic Surgery

## 2020-08-03 ENCOUNTER — Ambulatory Visit (INDEPENDENT_AMBULATORY_CARE_PROVIDER_SITE_OTHER): Payer: No Typology Code available for payment source | Admitting: Orthopaedic Surgery

## 2020-08-03 DIAGNOSIS — M1712 Unilateral primary osteoarthritis, left knee: Secondary | ICD-10-CM

## 2020-08-03 DIAGNOSIS — G8929 Other chronic pain: Secondary | ICD-10-CM | POA: Diagnosis not present

## 2020-08-03 DIAGNOSIS — M25562 Pain in left knee: Secondary | ICD-10-CM

## 2020-08-03 NOTE — Progress Notes (Signed)
Heather Stout comes in today for [place] to reevaluate her left knee.  Recently I did provide a steroid injection in that knee and it did help.  She does have moderate arthritis of the knee.  I do feel that she is a perfect candidate for hyaluronic acid at this standpoint.  There is no effusion on the knee today.  It seems the moving well.  She still has some pain and aching but is more tolerable.  We talked about treatment algorithms for moderate arthritis of the knee.  My neck step would be recommending hyaluronic acid.  She agrees with this treatment plan.  We will order it and be in contact with her when it comes and it is approved to treat the pain that she is dealing with with her left knee.

## 2020-08-04 ENCOUNTER — Telehealth: Payer: Self-pay

## 2020-08-04 NOTE — Telephone Encounter (Signed)
Noted will submit at the first of the year

## 2020-08-04 NOTE — Telephone Encounter (Signed)
Left knee gel injection ?

## 2020-08-08 ENCOUNTER — Other Ambulatory Visit: Payer: Self-pay | Admitting: Surgical

## 2020-08-08 ENCOUNTER — Other Ambulatory Visit: Payer: Self-pay | Admitting: Orthopaedic Surgery

## 2020-08-08 MED FILL — METHOCARBAMOL 500 MG TABS: 500 | 10 days supply | Qty: 30 | Fill #0

## 2020-08-10 ENCOUNTER — Telehealth: Payer: Self-pay

## 2020-08-10 NOTE — Telephone Encounter (Signed)
Started

## 2020-08-10 NOTE — Telephone Encounter (Signed)
FYI-  Patient would like to have her gel injection by the end of the year.

## 2020-08-10 NOTE — Telephone Encounter (Signed)
Submitted VOB for Monovisc, left knee.  Talked with Aaron Edelman at [facility] and was advised of patient's benefits for gel injection.  Per Vennie Homans is covered at 80% and PA is required for [id number], Monovisc. Call reference# [id number]  PA was initiated with Cherlynn Kaiser at [facility] and faxed office notes to [phone].  Call reference# [id number]

## 2020-08-10 NOTE — Telephone Encounter (Signed)
Yes

## 2020-08-10 NOTE — Telephone Encounter (Signed)
She has focus plan. Are you able to call to start this?

## 2020-08-14 ENCOUNTER — Telehealth: Payer: Self-pay

## 2020-08-14 NOTE — Telephone Encounter (Signed)
Approved, Monovisc, left knee. Buy & Bill Since OOP is met, patient is covered at 100% through her insurance No Co-pay PA required PA Approval# [id number] Valid 08/11/2020- 09/25/2020  Appt. 08/23/2020 with Dr. Ninfa Linden

## 2020-08-15 ENCOUNTER — Ambulatory Visit: Payer: No Typology Code available for payment source | Admitting: Allergy and Immunology

## 2020-08-16 ENCOUNTER — Other Ambulatory Visit (HOSPITAL_COMMUNITY): Payer: Self-pay | Admitting: Dermatology

## 2020-08-16 MED FILL — CLOBETASOL PROPIONATE 0.05: 0.05 | 30 days supply | Qty: 60 | Fill #0

## 2020-08-16 MED FILL — FLUOROURACIL 5 % CREA: 5 | 14 days supply | Qty: 40 | Fill #0

## 2020-08-22 ENCOUNTER — Other Ambulatory Visit: Payer: Self-pay | Admitting: Allergy and Immunology

## 2020-08-22 MED FILL — OMEPRAZOLE 40 MG CPDR: 40 | 30 days supply | Qty: 60 | Fill #0

## 2020-08-22 MED FILL — FREESTYLE LIBRE 14 DAY SENS: 28 days supply | Qty: 2 | Fill #3

## 2020-08-22 MED FILL — EDARBYCLOR 40-25 MG TABLET: 40-25 | 30 days supply | Qty: 30 | Fill #5

## 2020-08-22 MED FILL — TRESIBA FLEXTOUCH 200 UNITS: 200 | 30 days supply | Qty: 6 | Fill #6

## 2020-08-23 ENCOUNTER — Ambulatory Visit (INDEPENDENT_AMBULATORY_CARE_PROVIDER_SITE_OTHER): Payer: No Typology Code available for payment source | Admitting: Orthopaedic Surgery

## 2020-08-23 ENCOUNTER — Other Ambulatory Visit (HOSPITAL_COMMUNITY): Payer: Self-pay | Admitting: Endocrinology

## 2020-08-23 ENCOUNTER — Encounter: Payer: Self-pay | Admitting: Orthopaedic Surgery

## 2020-08-23 DIAGNOSIS — G8929 Other chronic pain: Secondary | ICD-10-CM

## 2020-08-23 DIAGNOSIS — M1712 Unilateral primary osteoarthritis, left knee: Secondary | ICD-10-CM

## 2020-08-23 DIAGNOSIS — M25562 Pain in left knee: Secondary | ICD-10-CM

## 2020-08-23 MED ORDER — HYALURONAN 88 MG/4ML IX SOSY
88.0000 mg | PREFILLED_SYRINGE | INTRA_ARTICULAR | Status: AC | PRN
  Administered 2020-08-23: 88 mg via INTRA_ARTICULAR

## 2020-08-23 MED FILL — OZEMPIC (1 MG/DOSE) 4 MG/3M: 4 | 84 days supply | Qty: 9 | Fill #0

## 2020-08-23 NOTE — Progress Notes (Signed)
   Procedure Note  Patient: Heather Stout             Date of Birth: [birth year]           MRN: [id number]             Visit Date: 08/23/2020  Procedures: Visit Diagnoses:  1. Chronic pain of left knee   2. Unilateral primary osteoarthritis, left knee     Large Joint Inj: L knee on 08/23/2020 4:29 PM Indications: diagnostic evaluation and pain Details: 22 G 1.5 in needle, superolateral approach  Arthrogram: No  Medications: 88 mg Hyaluronan 88 MG/4ML Outcome: tolerated well, no immediate complications Procedure, treatment alternatives, risks and benefits explained, specific risks discussed. Consent was given by the patient. Immediately prior to procedure a time out was called to verify the correct patient, procedure, equipment, support staff and site/side marked as required. Patient was prepped and draped in the usual sterile fashion.    [name] is here today for scheduled hyaluronic acid injection with Monovisc in her left knee to treat the pain from osteoarthritis.  She has had no other acute change in medical status.  Her left knee shows no effusion today with good range of motion.  There are some global tenderness from her arthritis.  Her knee is ligamentously stable.  I did place Monovisc in her left knee today without difficulty.  All questions and concerns were answered and addressed.  Follow-up can be as needed.

## 2020-08-30 ENCOUNTER — Other Ambulatory Visit (HOSPITAL_COMMUNITY): Payer: Self-pay | Admitting: Internal Medicine

## 2020-08-30 MED FILL — HUMALOG 100 UNITS/ML KWIKPE: 100 | 30 days supply | Qty: 18 | Fill #0

## 2020-09-18 ENCOUNTER — Other Ambulatory Visit (HOSPITAL_COMMUNITY): Payer: Self-pay | Admitting: Internal Medicine

## 2020-09-18 MED FILL — OMEPRAZOLE 40 MG CPDR: 40 | 30 days supply | Qty: 60 | Fill #1

## 2020-09-18 MED FILL — HYOSCYAMINE ER 0.375 MG TAB: 0.375 | 30 days supply | Qty: 60 | Fill #2

## 2020-09-19 MED FILL — FENOFIBRATE 145 MG TABS: 145 | 90 days supply | Qty: 90 | Fill #0

## 2020-09-19 MED FILL — EDARBYCLOR 40-25 MG TABLET: 40-25 | 30 days supply | Qty: 30 | Fill #0

## 2020-09-20 MED FILL — FREESTYLE LIBRE 14 DAY SENS: 28 days supply | Qty: 2 | Fill #4

## 2020-09-26 ENCOUNTER — Ambulatory Visit (INDEPENDENT_AMBULATORY_CARE_PROVIDER_SITE_OTHER): Payer: No Typology Code available for payment source

## 2020-09-26 ENCOUNTER — Encounter: Payer: Self-pay | Admitting: Orthopaedic Surgery

## 2020-09-26 ENCOUNTER — Ambulatory Visit (INDEPENDENT_AMBULATORY_CARE_PROVIDER_SITE_OTHER): Payer: No Typology Code available for payment source | Admitting: Orthopaedic Surgery

## 2020-09-26 ENCOUNTER — Other Ambulatory Visit: Payer: Self-pay

## 2020-09-26 ENCOUNTER — Other Ambulatory Visit (HOSPITAL_COMMUNITY): Payer: Self-pay | Admitting: Orthopaedic Surgery

## 2020-09-26 VITALS — BP 150/85 | HR 92 | Ht 67.0 in | Wt 185.0 lb

## 2020-09-26 DIAGNOSIS — M545 Low back pain, unspecified: Secondary | ICD-10-CM

## 2020-09-26 MED ORDER — PREDNISONE 5 MG (21) PO TBPK: ORAL_TABLET | ORAL | 0 refills | Status: DC

## 2020-09-26 MED FILL — predniSONE 5 MG TABS: 5 | 6 days supply | Qty: 21 | Fill #0

## 2020-09-26 NOTE — Progress Notes (Unsigned)
Office Visit Note   Patient: Heather Stout           Date of Birth: [birth year]           MRN: [id number] Visit Date: 09/26/2020              Requested by: Deland Pretty, MD [address] [place],  [place] [address] PCP: Deland Pretty, MD   Assessment & Plan: Visit Diagnoses:  1. Acute left-sided low back pain, unspecified whether sciatica present     Plan: We will place patient on a reduced dose of prednisone Dosepak 5 mg.  She has insulin and can adjust her insulin up if her CBGs increase.  Hopefully she can get some relief and if not we will need to consider reimaging studies with and without contrast lumbar MRI. Follow-Up Instructions: No follow-ups on file.   Orders:  Orders Placed This Encounter  Procedures  . XR Lumbar Spine 2-3 Views   Meds ordered this encounter  Medications  . predniSONE (STERAPRED UNI-PAK 21 TAB) 5 MG (21) TBPK tablet    Sig: Take as instructed.    Dispense:  21 tablet    Refill:  0      Procedures: No procedures performed   Clinical Data: No additional findings.   Subjective: Chief Complaint  Patient presents with  . Lower Back - Pain    HPI 65 year old female with past history of left L5-S1 microdiscectomy x2 last procedure more than 15 years started having significant back pain last week over the SI joint radiating into her buttocks and into her leg.  She has had significant pain including problems bending difficulty walking.  At one point she states her pain was severe.  Pain does not radiate past her knee.  She had noted some increased urge bowel movement did not like she had complete control similar problems she had had previously.  She tried Celebrex without relief.  Patient does have diabetes is on insulin.  Review of Systems 14 point review system positive for diabetes.  Previous microdiscectomy L5-S1 history of IBS hyperlipidemia hypertension controlled.   Objective: Vital Signs: BP (!) 150/85   Pulse 92    Ht 5\' 7"  (1.702 m)   Wt 185 lb (83.9 kg)   BMI 28.98 kg/m   Physical Exam Constitutional:      Appearance: She is well-developed.  HENT:     Head: Normocephalic.     Right Ear: External ear normal.     Left Ear: External ear normal.  Eyes:     Pupils: Pupils are equal, round, and reactive to light.  Neck:     Thyroid: No thyromegaly.     Trachea: No tracheal deviation.  Cardiovascular:     Rate and Rhythm: Normal rate.  Pulmonary:     Effort: Pulmonary effort is normal.  Abdominal:     Palpations: Abdomen is soft.  Skin:    General: Skin is warm and dry.  Neurological:     Mental Status: She is alert and oriented to person, place, and time.  Psychiatric:        Mood and Affect: Mood and affect normal.        Behavior: Behavior normal.     Ortho Exam patient has been extremity raising on the left at 70 degrees positive sciatic notch tenderness on left negative on the right well-healed lumbar incision.  Knee and ankle jerk are intact anterior tib EHL gastrocsoleus is intact.  Specialty Comments:  No specialty comments available.  Imaging: No results found.   PMFS History: Patient Active Problem List   Diagnosis Date Noted  . Essential hypertension 04/04/2015  . Hyperlipemia 04/04/2015  . Irritable bowel syndrome 04/04/2015  . Diabetes ([place]) 11/29/2014   Past Medical History:  Diagnosis Date  . Asthma   . Essential hypertension, benign   . GERD (gastroesophageal reflux disease)   . IBS (irritable bowel syndrome)   . Left anterior hemiblock   . Other and unspecified hyperlipidemia   . Sleep apnea    cpap  . Type II or unspecified type diabetes mellitus without mention of complication, not stated as uncontrolled     Family History  Problem Relation Age of Onset  . Hypertension Father   . Hypertension Brother   . Diabetes Brother   . Hypertension Brother   . Crohn's disease Brother   . Hypertension Sister   . Diabetes Sister     Past Surgical History:   Procedure Laterality Date  . FUNCTIONAL ENDOSCOPIC SINUS SURGERY  1990  . LUMBAR LAMINECTOMY  2002  . LUMBAR LAMINECTOMY  2005  . SHOULDER ARTHROSCOPY WITH OPEN ROTATOR CUFF REPAIR Right 05/15/2020   Procedure: right shoulder arthroscopy, debridement, mini open rotator cuff tear repair, biceps tenodesis;  Surgeon: Meredith Pel, MD;  Location: [facility];  Service: Orthopedics;  Laterality: Right;  . TONSILLECTOMY  1980   Social History   Occupational History  . Occupation: Insurance underwriter  Tobacco Use  . Smoking status: Never Smoker  . Smokeless tobacco: Never Used  Substance and Sexual Activity  . Alcohol use: No    Alcohol/week: 0.0 standard drinks  . Drug use: No  . Sexual activity: Not on file

## 2020-09-27 ENCOUNTER — Telehealth: Payer: Self-pay | Admitting: Radiology

## 2020-09-27 ENCOUNTER — Other Ambulatory Visit: Payer: Self-pay | Admitting: Orthopaedic Surgery

## 2020-09-27 MED ORDER — TRAMADOL HCL 50 MG PO TABS: 50.0000 mg | ORAL_TABLET | Freq: Two times a day (BID) | ORAL | 0 refills | Status: DC | PRN

## 2020-09-27 MED FILL — traMADol HCL 50 MG TABS: 50 | 15 days supply | Qty: 30 | Fill #0

## 2020-09-27 NOTE — Telephone Encounter (Signed)
FYI  Per patient, medication was offered yesterday, however she did not think that she needed it. She continues to hurt and feels that she could use something when the pain gets so severe.  Tramadol 50mg  1 po bid #30 with no refills called in per your advice.  I did explain to patient that she can take up to three times daily if needed.

## 2020-10-11 MED FILL — TRESIBA FLEXTOUCH 200 UNITS: 200 | 30 days supply | Qty: 6 | Fill #7

## 2020-10-17 MED FILL — HUMALOG 100 UNITS/ML KWIKPE: 100 | 30 days supply | Qty: 18 | Fill #1

## 2020-10-17 MED FILL — OMEPRAZOLE 40 MG CPDR: 40 | 30 days supply | Qty: 60 | Fill #2

## 2020-10-18 ENCOUNTER — Ambulatory Visit: Payer: No Typology Code available for payment source | Admitting: Orthopaedic Surgery

## 2020-10-18 MED FILL — FREESTYLE LIBRE 14 DAY SENS: 28 days supply | Qty: 2 | Fill #5

## 2020-10-24 ENCOUNTER — Ambulatory Visit (INDEPENDENT_AMBULATORY_CARE_PROVIDER_SITE_OTHER): Payer: No Typology Code available for payment source | Admitting: Orthopaedic Surgery

## 2020-10-24 ENCOUNTER — Encounter: Payer: Self-pay | Admitting: Orthopaedic Surgery

## 2020-10-24 ENCOUNTER — Other Ambulatory Visit: Payer: Self-pay

## 2020-10-24 DIAGNOSIS — M5126 Other intervertebral disc displacement, lumbar region: Secondary | ICD-10-CM

## 2020-10-24 NOTE — Progress Notes (Signed)
Office Visit Note   Patient: Heather Stout           Date of Birth: 06-Oct-1955           MRN: [id number] Visit Date: 10/24/2020              Requested by: Deland Pretty, MD [address] [place],  [place] [address] PCP: Deland Pretty, MD   Assessment & Plan: Visit Diagnoses:  1. Protrusion of lumbar intervertebral disc     Plan: Patient will work on core strengthening and a walking program.  She knows exercises to work on and activities to avoid such as bending and lumbar twisting.  She is somewhat improved with the prednisone Dosepak.  She has some diabetes and watch her sugars carefully.  If she has persistent symptoms after several more weeks she will need to proceed with MRI scan with and without contrast.  Since she has gotten some improvement will continue conservative treatment.  She continues to have pain in her back down her left leg.  Follow-Up Instructions: No follow-ups on file.   Orders:  No orders of the defined types were placed in this encounter.  No orders of the defined types were placed in this encounter.     Procedures: No procedures performed   Clinical Data: No additional findings.   Subjective: Chief Complaint  Patient presents with  . Lower Back - Follow-up, Pain    HPI 65 year old female returns post lumbar microdiscectomy x2 more than 10 years ago at L5-S1 on the left with recurrent back and left leg pain.  She has diabetes and we have placed her on a reduced dose of prednisone which gave her good relief but as it wore off she had recurrent symptoms.  She has tramadol she is used very sparingly.  She is try to avoid narcotic medication.  She is done walking program and has been through therapy in the distant past.  Pain that she has in her back and her leg is similar to the problem she has had in the past.  Patient's bed multiple epidurals including 2001 2002.  Surgery L5-S1 left 2005.  Shoulder surgery by Dr. Marlou Sa 2021 currently  doing well.  No recent bowel bladder symptoms but she did have some problems when she had large disc herniation 2005.  Review of Systems   Objective: Vital Signs: Ht 5\' 7"  (1.702 m)   Wt 185 lb (83.9 kg)   BMI 28.98 kg/m   Physical Exam  Ortho Exam  Specialty Comments:  No specialty comments available.  Imaging: No results found.   PMFS History: Patient Active Problem List   Diagnosis Date Noted  . Protrusion of lumbar intervertebral disc 10/25/2020  . Essential hypertension 04/04/2015  . Hyperlipemia 04/04/2015  . Irritable bowel syndrome 04/04/2015  . Diabetes ([place]) 11/29/2014   Past Medical History:  Diagnosis Date  . Asthma   . Essential hypertension, benign   . GERD (gastroesophageal reflux disease)   . IBS (irritable bowel syndrome)   . Left anterior hemiblock   . Other and unspecified hyperlipidemia   . Sleep apnea    cpap  . Type II or unspecified type diabetes mellitus without mention of complication, not stated as uncontrolled     Family History  Problem Relation Age of Onset  . Hypertension Father   . Hypertension Brother   . Diabetes Brother   . Hypertension Brother   . Crohn's disease Brother   . Hypertension Sister   .  Diabetes Sister     Past Surgical History:  Procedure Laterality Date  . FUNCTIONAL ENDOSCOPIC SINUS SURGERY  1990  . LUMBAR LAMINECTOMY  2002  . LUMBAR LAMINECTOMY  2005  . SHOULDER ARTHROSCOPY WITH OPEN ROTATOR CUFF REPAIR Right 05/15/2020   Procedure: right shoulder arthroscopy, debridement, mini open rotator cuff tear repair, biceps tenodesis;  Surgeon: Meredith Pel, MD;  Location: [facility];  Service: Orthopedics;  Laterality: Right;  . TONSILLECTOMY  1980   Social History   Occupational History  . Occupation: Insurance underwriter  Tobacco Use  . Smoking status: Never Smoker  . Smokeless tobacco: Never Used  Substance and Sexual Activity  . Alcohol use: No    Alcohol/week: 0.0 standard drinks  . Drug  use: No  . Sexual activity: Not on file

## 2020-10-25 DIAGNOSIS — M5126 Other intervertebral disc displacement, lumbar region: Secondary | ICD-10-CM | POA: Insufficient documentation

## 2020-10-25 MED FILL — EDARBYCLOR 40-25 MG TABLET: 40-25 | 30 days supply | Qty: 30 | Fill #1

## 2020-10-25 MED FILL — UNIFINE PENTIPS 31GX3/16: 31G X 5 MM | 75 days supply | Qty: 300 | Fill #3

## 2020-10-26 ENCOUNTER — Other Ambulatory Visit (HOSPITAL_COMMUNITY): Payer: Self-pay | Admitting: Gastroenterology

## 2020-10-26 MED FILL — VIBERZI 100 MG TABS: 100 | 90 days supply | Qty: 180 | Fill #0

## 2020-11-15 ENCOUNTER — Other Ambulatory Visit (HOSPITAL_COMMUNITY): Payer: Self-pay | Admitting: Internal Medicine

## 2020-11-15 ENCOUNTER — Other Ambulatory Visit: Payer: Self-pay | Admitting: Allergy and Immunology

## 2020-11-15 MED FILL — OZEMPIC (1 MG/DOSE) 4 MG/3M: 4 | 84 days supply | Qty: 9 | Fill #1

## 2020-11-15 MED FILL — OMEPRAZOLE 40 MG CPDR: 40 | 90 days supply | Qty: 90 | Fill #0

## 2020-11-16 MED FILL — FREESTYLE LIBRE 14 DAY SENS: 28 days supply | Qty: 2 | Fill #6

## 2020-11-20 ENCOUNTER — Other Ambulatory Visit (HOSPITAL_COMMUNITY): Payer: Self-pay | Admitting: Orthopaedic Surgery

## 2020-11-20 ENCOUNTER — Telehealth: Payer: Self-pay | Admitting: Radiology

## 2020-11-20 MED ORDER — PREDNISONE 5 MG (21) PO TBPK: ORAL_TABLET | ORAL | 0 refills | Status: DC

## 2020-11-20 MED FILL — HYOSCYAMINE ER 0.375 MG TAB: 0.375 | 30 days supply | Qty: 60 | Fill #3

## 2020-11-20 MED FILL — predniSONE 5 MG TABS: 5 | 6 days supply | Qty: 21 | Fill #0

## 2020-11-20 NOTE — Telephone Encounter (Signed)
Sent to pharmacy. Patient aware.  

## 2020-11-20 NOTE — Telephone Encounter (Signed)
OK - thanks

## 2020-11-20 NOTE — Addendum Note (Signed)
Addended by: Meyer Cory on: 11/20/2020 01:32 PM   Modules accepted: Orders

## 2020-11-20 NOTE — Telephone Encounter (Signed)
Patient has had another flare in back pain x 3 days and requests refill on Prednisone. OK to refill?

## 2020-11-28 ENCOUNTER — Other Ambulatory Visit (HOSPITAL_COMMUNITY): Payer: Self-pay

## 2020-11-28 MED FILL — Azilsartan Medoxomil-Chlorthalidone Tab 40-25 MG: ORAL | 30 days supply | Qty: 30 | Fill #0 | Status: AC

## 2020-11-28 MED FILL — Insulin Lispro Soln Pen-injector 100 Unit/ML (1 Unit Dial): SUBCUTANEOUS | 30 days supply | Qty: 18 | Fill #0 | Status: AC

## 2020-12-01 ENCOUNTER — Other Ambulatory Visit (HOSPITAL_COMMUNITY): Payer: Self-pay

## 2020-12-04 ENCOUNTER — Other Ambulatory Visit (HOSPITAL_COMMUNITY): Payer: Self-pay

## 2020-12-11 ENCOUNTER — Other Ambulatory Visit (HOSPITAL_COMMUNITY): Payer: Self-pay

## 2020-12-11 ENCOUNTER — Other Ambulatory Visit (HOSPITAL_COMMUNITY): Payer: Self-pay | Admitting: Internal Medicine

## 2020-12-12 ENCOUNTER — Other Ambulatory Visit (HOSPITAL_COMMUNITY): Payer: Self-pay

## 2020-12-14 ENCOUNTER — Other Ambulatory Visit (HOSPITAL_COMMUNITY): Payer: Self-pay

## 2020-12-15 ENCOUNTER — Other Ambulatory Visit (HOSPITAL_COMMUNITY): Payer: Self-pay

## 2020-12-15 ENCOUNTER — Other Ambulatory Visit (HOSPITAL_COMMUNITY): Payer: Self-pay | Admitting: Internal Medicine

## 2020-12-18 ENCOUNTER — Other Ambulatory Visit (HOSPITAL_COMMUNITY): Payer: Self-pay

## 2020-12-18 MED ORDER — FREESTYLE LIBRE 14 DAY SENSOR MISC
1.0000 | 3 refills | Status: DC
  Filled 2020-12-18: qty 2, 28d supply, fill #0
  Filled 2021-01-17: qty 2, 28d supply, fill #1
  Filled 2021-02-14: qty 2, 28d supply, fill #2
  Filled 2021-03-14: qty 2, 28d supply, fill #3

## 2020-12-19 ENCOUNTER — Other Ambulatory Visit (HOSPITAL_COMMUNITY): Payer: Self-pay | Admitting: Internal Medicine

## 2020-12-19 ENCOUNTER — Other Ambulatory Visit (HOSPITAL_COMMUNITY): Payer: Self-pay

## 2020-12-22 ENCOUNTER — Other Ambulatory Visit (HOSPITAL_COMMUNITY): Payer: Self-pay

## 2020-12-26 ENCOUNTER — Other Ambulatory Visit (HOSPITAL_COMMUNITY): Payer: Self-pay

## 2020-12-26 MED FILL — Fenofibrate Tab 145 MG: ORAL | 90 days supply | Qty: 90 | Fill #0 | Status: CN

## 2020-12-26 MED FILL — Azilsartan Medoxomil-Chlorthalidone Tab 40-25 MG: ORAL | 30 days supply | Qty: 30 | Fill #1 | Status: CN

## 2021-01-03 ENCOUNTER — Other Ambulatory Visit (HOSPITAL_COMMUNITY): Payer: Self-pay

## 2021-01-03 ENCOUNTER — Other Ambulatory Visit (HOSPITAL_COMMUNITY): Payer: Self-pay | Admitting: Internal Medicine

## 2021-01-04 ENCOUNTER — Other Ambulatory Visit (HOSPITAL_COMMUNITY): Payer: Self-pay

## 2021-01-04 MED ORDER — TRESIBA FLEXTOUCH 200 UNIT/ML ~~LOC~~ SOPN
24.0000 [IU] | PEN_INJECTOR | Freq: Every day | SUBCUTANEOUS | 11 refills | Status: DC
  Filled 2021-01-04: qty 9, 75d supply, fill #0
  Filled 2021-04-04: qty 9, 75d supply, fill #1
  Filled 2021-06-22: qty 9, 75d supply, fill #2
  Filled 2021-08-21: qty 9, 75d supply, fill #3
  Filled 2021-11-27: qty 9, 75d supply, fill #4

## 2021-01-04 MED ORDER — BENZONATATE 200 MG PO CAPS
200.0000 mg | ORAL_CAPSULE | Freq: Three times a day (TID) | ORAL | 0 refills | Status: AC | PRN
  Filled 2021-01-04: qty 42, 14d supply, fill #0

## 2021-01-04 MED ORDER — IPRATROPIUM BROMIDE 0.06 % NA SOLN
2.0000 | Freq: Three times a day (TID) | NASAL | 2 refills | Status: DC
  Filled 2021-01-04: qty 15, 25d supply, fill #0

## 2021-01-04 MED FILL — Fenofibrate Tab 145 MG: ORAL | 90 days supply | Qty: 90 | Fill #0 | Status: AC

## 2021-01-04 MED FILL — Azilsartan Medoxomil-Chlorthalidone Tab 40-25 MG: ORAL | 30 days supply | Qty: 30 | Fill #1 | Status: AC

## 2021-01-08 ENCOUNTER — Other Ambulatory Visit (HOSPITAL_COMMUNITY): Payer: Self-pay

## 2021-01-08 ENCOUNTER — Other Ambulatory Visit: Payer: Self-pay | Admitting: Internal Medicine

## 2021-01-08 MED FILL — Omeprazole Cap Delayed Release 40 MG: ORAL | 90 days supply | Qty: 90 | Fill #0 | Status: CN

## 2021-01-08 MED FILL — Insulin Pen Needle 31 G X 5 MM (1/5" or 3/16"): 75 days supply | Qty: 300 | Fill #0 | Status: AC

## 2021-01-09 ENCOUNTER — Other Ambulatory Visit (HOSPITAL_COMMUNITY): Payer: Self-pay

## 2021-01-10 ENCOUNTER — Other Ambulatory Visit (HOSPITAL_COMMUNITY): Payer: Self-pay

## 2021-01-10 MED FILL — Insulin Lispro Soln Pen-injector 100 Unit/ML (1 Unit Dial): SUBCUTANEOUS | 30 days supply | Qty: 18 | Fill #1 | Status: AC

## 2021-01-11 ENCOUNTER — Other Ambulatory Visit: Payer: Self-pay | Admitting: Internal Medicine

## 2021-01-11 ENCOUNTER — Other Ambulatory Visit (HOSPITAL_COMMUNITY): Payer: Self-pay

## 2021-01-17 ENCOUNTER — Other Ambulatory Visit (HOSPITAL_COMMUNITY): Payer: Self-pay

## 2021-01-24 ENCOUNTER — Other Ambulatory Visit (HOSPITAL_COMMUNITY): Payer: Self-pay

## 2021-01-24 MED FILL — Hyoscyamine Sulfate Tab ER 12HR 0.375 MG: ORAL | 30 days supply | Qty: 60 | Fill #0 | Status: AC

## 2021-01-24 MED FILL — Eluxadoline Tab 100 MG: ORAL | 90 days supply | Qty: 180 | Fill #0 | Status: CN

## 2021-01-25 ENCOUNTER — Other Ambulatory Visit (HOSPITAL_COMMUNITY): Payer: Self-pay

## 2021-01-25 MED ORDER — OMEPRAZOLE 40 MG PO CPDR
40.0000 mg | DELAYED_RELEASE_CAPSULE | Freq: Two times a day (BID) | ORAL | 3 refills | Status: DC
  Filled 2021-01-25 – 2021-01-26 (×2): qty 180, 90d supply, fill #0
  Filled 2021-05-02: qty 180, 90d supply, fill #1

## 2021-01-25 MED FILL — Eluxadoline Tab 100 MG: ORAL | 90 days supply | Qty: 180 | Fill #0 | Status: CN

## 2021-01-26 ENCOUNTER — Other Ambulatory Visit (HOSPITAL_COMMUNITY): Payer: Self-pay

## 2021-01-29 ENCOUNTER — Other Ambulatory Visit (HOSPITAL_COMMUNITY): Payer: Self-pay

## 2021-01-29 MED ORDER — XIGDUO XR 10-500 MG PO TB24
1.0000 | ORAL_TABLET | Freq: Every day | ORAL | 2 refills | Status: DC
  Filled 2021-01-29: qty 90, 90d supply, fill #0
  Filled 2021-05-08: qty 90, 90d supply, fill #1
  Filled 2021-08-08: qty 90, 90d supply, fill #2

## 2021-01-29 MED FILL — Azilsartan Medoxomil-Chlorthalidone Tab 40-25 MG: ORAL | 30 days supply | Qty: 30 | Fill #2 | Status: AC

## 2021-01-30 ENCOUNTER — Other Ambulatory Visit: Payer: Self-pay

## 2021-01-30 ENCOUNTER — Other Ambulatory Visit (HOSPITAL_COMMUNITY): Payer: Self-pay

## 2021-01-30 MED ORDER — OZEMPIC (1 MG/DOSE) 4 MG/3ML ~~LOC~~ SOPN
1.0000 mg | PEN_INJECTOR | SUBCUTANEOUS | 5 refills | Status: DC
  Filled 2021-01-30: qty 9, 84d supply, fill #0
  Filled 2021-05-27: qty 9, 84d supply, fill #1

## 2021-01-30 MED FILL — Semaglutide Soln Pen-inj 1 MG/DOSE (4 MG/3ML): SUBCUTANEOUS | 28 days supply | Qty: 3 | Fill #0 | Status: CN

## 2021-02-02 ENCOUNTER — Other Ambulatory Visit (HOSPITAL_COMMUNITY): Payer: Self-pay

## 2021-02-02 MED FILL — Eluxadoline Tab 100 MG: ORAL | 90 days supply | Qty: 180 | Fill #0 | Status: AC

## 2021-02-14 ENCOUNTER — Other Ambulatory Visit (HOSPITAL_COMMUNITY): Payer: Self-pay

## 2021-02-21 ENCOUNTER — Other Ambulatory Visit (HOSPITAL_COMMUNITY): Payer: Self-pay

## 2021-03-02 ENCOUNTER — Other Ambulatory Visit (HOSPITAL_COMMUNITY): Payer: Self-pay

## 2021-03-05 ENCOUNTER — Other Ambulatory Visit (HOSPITAL_COMMUNITY): Payer: Self-pay

## 2021-03-05 ENCOUNTER — Other Ambulatory Visit: Payer: Self-pay

## 2021-03-05 MED FILL — Azilsartan Medoxomil-Chlorthalidone Tab 40-25 MG: ORAL | 30 days supply | Qty: 30 | Fill #3 | Status: AC

## 2021-03-06 ENCOUNTER — Other Ambulatory Visit (HOSPITAL_COMMUNITY): Payer: Self-pay

## 2021-03-06 MED ORDER — INSULIN LISPRO (1 UNIT DIAL) 100 UNIT/ML (KWIKPEN)
60.0000 [IU] | PEN_INJECTOR | Freq: Every day | SUBCUTANEOUS | 3 refills | Status: DC
  Filled 2021-03-06: qty 18, 30d supply, fill #0
  Filled 2021-04-04: qty 18, 30d supply, fill #1
  Filled 2021-05-27: qty 18, 30d supply, fill #2
  Filled 2021-07-10: qty 18, 30d supply, fill #3

## 2021-03-14 ENCOUNTER — Other Ambulatory Visit (HOSPITAL_COMMUNITY): Payer: Self-pay

## 2021-03-15 ENCOUNTER — Other Ambulatory Visit: Payer: Self-pay

## 2021-03-15 ENCOUNTER — Ambulatory Visit (INDEPENDENT_AMBULATORY_CARE_PROVIDER_SITE_OTHER): Payer: No Typology Code available for payment source | Admitting: Orthopedic Surgery

## 2021-03-15 DIAGNOSIS — M75121 Complete rotator cuff tear or rupture of right shoulder, not specified as traumatic: Secondary | ICD-10-CM

## 2021-03-17 ENCOUNTER — Encounter: Payer: Self-pay | Admitting: Orthopedic Surgery

## 2021-03-17 NOTE — Progress Notes (Signed)
Post-Op Visit Note   Patient: Heather Stout           Date of Birth: 08-18-1956           MRN: [id number] Visit Date: 03/15/2021 PCP: Deland Pretty, MD   Assessment & Plan:  Chief Complaint:  Chief Complaint  Patient presents with   Right Shoulder - Pain   Visit Diagnoses: No diagnosis found.  Plan: Ruthmae is a patient who underwent right shoulder arthroscopy with atypical rotator cuff tear repair about 10 months ago.  She was doing reasonably well until she lifted up some potting soil and rocks.  She felt pain in the right shoulder.  That is been going on now for about a month.  On exam she does have some new coarseness and grinding concerning for possible retear of the rotator cuff.  Her strength is excellent.  At this time she can live with what she has.  The rotator cuff surgery was a difficult surgery to get over.  I looked back at the operative note and the supraspinatus was the primary tendon involved but it was a tear that was a little bit medial to the typical tears that are seen.  Nonetheless I think if she wants to work it up further then repeat MRI scanning would be indicated to assess the integrity of that cuff repair.  She does have good infraspinatus strength and subscap strength so force couple is maintained at this time.  She will consider her options and let me know what she wants to do.  Follow-Up Instructions: No follow-ups on file.   Orders:  No orders of the defined types were placed in this encounter.  No orders of the defined types were placed in this encounter.   Imaging: No results found.  PMFS History: Patient Active Problem List   Diagnosis Date Noted   Protrusion of lumbar intervertebral disc 10/25/2020   Essential hypertension 04/04/2015   Hyperlipemia 04/04/2015   Irritable bowel syndrome 04/04/2015   Diabetes ([facility]) 11/29/2014   Past Medical History:  Diagnosis Date   Asthma    Essential hypertension, benign    GERD (gastroesophageal  reflux disease)    IBS (irritable bowel syndrome)    Left anterior hemiblock    Other and unspecified hyperlipidemia    Sleep apnea    cpap   Type II or unspecified type diabetes mellitus without mention of complication, not stated as uncontrolled     Family History  Problem Relation Age of Onset   Hypertension Father    Hypertension Brother    Diabetes Brother    Hypertension Brother    Crohn's disease Brother    Hypertension Sister    Diabetes Sister     Past Surgical History:  Procedure Laterality Date   FUNCTIONAL ENDOSCOPIC SINUS SURGERY  1990   LUMBAR LAMINECTOMY  2002   LUMBAR LAMINECTOMY  2005   SHOULDER ARTHROSCOPY WITH OPEN ROTATOR CUFF REPAIR Right 05/15/2020   Procedure: right shoulder arthroscopy, debridement, mini open rotator cuff tear repair, biceps tenodesis;  Surgeon: Meredith Pel, MD;  Location: [facility];  Service: Orthopedics;  Laterality: Right;   TONSILLECTOMY  1980   Social History   Occupational History   Occupation: Insurance  Tobacco Use   Smoking status: Never   Smokeless tobacco: Never  Substance and Sexual Activity   Alcohol use: No    Alcohol/week: 0.0 standard drinks   Drug use: No   Sexual activity: Not on file

## 2021-03-19 ENCOUNTER — Telehealth: Payer: Self-pay

## 2021-03-19 DIAGNOSIS — M25511 Pain in right shoulder: Secondary | ICD-10-CM

## 2021-03-19 NOTE — Telephone Encounter (Signed)
Patient stated you mentioned her possibly getting a scan. Please advise.

## 2021-03-20 NOTE — Telephone Encounter (Signed)
Ok for mri arthrogram eval recurrent rct thx r shoulder

## 2021-03-20 NOTE — Addendum Note (Signed)
Addended byLaurann Montana on: 03/20/2021 01:18 PM   Modules accepted: Orders

## 2021-03-20 NOTE — Telephone Encounter (Signed)
ordered

## 2021-03-27 ENCOUNTER — Telehealth: Payer: Self-pay

## 2021-03-27 NOTE — Telephone Encounter (Signed)
VOB submitted for Monovisc, left knee. Pending BV.

## 2021-03-27 NOTE — Telephone Encounter (Signed)
Left knee gel injection ?

## 2021-03-27 NOTE — Telephone Encounter (Signed)
Noted  

## 2021-03-28 ENCOUNTER — Other Ambulatory Visit: Payer: Self-pay

## 2021-03-28 ENCOUNTER — Telehealth: Payer: Self-pay

## 2021-03-28 ENCOUNTER — Ambulatory Visit
Admission: RE | Admit: 2021-03-28 | Discharge: 2021-03-28 | Disposition: A | Discharge status: Home / Self Care | Payer: No Typology Code available for payment source | Source: Ambulatory Visit | Attending: Orthopedic Surgery | Admitting: Orthopedic Surgery

## 2021-03-28 DIAGNOSIS — M25511 Pain in right shoulder: Secondary | ICD-10-CM

## 2021-03-28 MED ORDER — IOPAMIDOL (ISOVUE-M 200) INJECTION 41%
13.0000 mL | Freq: Once | INTRAMUSCULAR | Status: AC
  Administered 2021-03-28: 13 mL via INTRA_ARTICULAR

## 2021-03-28 NOTE — Telephone Encounter (Signed)
PA started with [facility] for Monovisc, left knee. Reference# Heather Ingram11/27/1957 PA Pending  Faxed office note to [phone]

## 2021-04-04 ENCOUNTER — Telehealth: Payer: Self-pay

## 2021-04-04 ENCOUNTER — Other Ambulatory Visit (HOSPITAL_COMMUNITY): Payer: Self-pay

## 2021-04-04 ENCOUNTER — Other Ambulatory Visit: Payer: Self-pay

## 2021-04-04 MED ORDER — INSULIN PEN NEEDLE 31G X 5 MM MISC
3 refills | Status: DC
  Filled 2021-04-04: qty 300, 75d supply, fill #0
  Filled 2021-06-11: qty 300, 75d supply, fill #1
  Filled 2021-09-03: qty 300, 75d supply, fill #2
  Filled 2021-11-27 – 2021-12-03 (×2): qty 300, 75d supply, fill #3
  Filled 2022-02-19: qty 300, 75d supply, fill #4

## 2021-04-04 MED FILL — Azilsartan Medoxomil-Chlorthalidone Tab 40-25 MG: ORAL | 30 days supply | Qty: 30 | Fill #4 | Status: AC

## 2021-04-04 NOTE — Telephone Encounter (Signed)
Yes,  it stated that on the denial letter that she had received them before.  We can do an appeal if he would like, but it requires a letter as to why he wants the appeal and her medical record for review.  Let me know what he decides.

## 2021-04-04 NOTE — Telephone Encounter (Signed)
Received fax from [facility] stating that patient was denied for Monovisc, left knee due to not being medically necessary. Alternative treatments would be physical therapy, activity modification, medications, steroid injections and surgery.   Please advise on next option.  Thank you.

## 2021-04-06 NOTE — Progress Notes (Signed)
Hi Lauren I called [facility] and she wants to get her shoulder for sometime in September.  Can you send me this message and I will fill out blue sheet on Monday.  Thank you

## 2021-04-09 ENCOUNTER — Other Ambulatory Visit (HOSPITAL_COMMUNITY): Payer: Self-pay

## 2021-04-09 ENCOUNTER — Other Ambulatory Visit: Payer: Self-pay

## 2021-04-09 MED ORDER — HYOSCYAMINE SULFATE ER 0.375 MG PO TB12
0.3750 mg | ORAL_TABLET | Freq: Two times a day (BID) | ORAL | 0 refills | Status: DC | PRN
  Filled 2021-04-09: qty 60, 30d supply, fill #0

## 2021-04-09 MED FILL — Eluxadoline Tab 100 MG: ORAL | 90 days supply | Qty: 180 | Fill #1 | Status: CN

## 2021-04-10 ENCOUNTER — Other Ambulatory Visit (HOSPITAL_COMMUNITY): Payer: Self-pay

## 2021-04-10 ENCOUNTER — Other Ambulatory Visit: Payer: Self-pay

## 2021-04-10 MED ORDER — FENOFIBRATE 145 MG PO TABS
145.0000 mg | ORAL_TABLET | Freq: Every day | ORAL | 3 refills | Status: DC
  Filled 2021-04-10: qty 90, 90d supply, fill #0
  Filled 2021-07-10: qty 90, 90d supply, fill #1
  Filled 2021-10-24: qty 90, 90d supply, fill #2

## 2021-04-12 ENCOUNTER — Other Ambulatory Visit (HOSPITAL_COMMUNITY): Payer: Self-pay

## 2021-04-12 MED ORDER — FREESTYLE LIBRE 14 DAY SENSOR MISC
1.0000 | 3 refills | Status: DC
  Filled 2021-04-12: qty 2, 28d supply, fill #0
  Filled 2021-07-17 – 2021-07-18 (×2): qty 2, 28d supply, fill #1
  Filled 2021-08-13: qty 2, 28d supply, fill #2
  Filled 2022-01-15: qty 2, 28d supply, fill #3

## 2021-04-12 NOTE — Progress Notes (Signed)
Done thx

## 2021-04-16 ENCOUNTER — Other Ambulatory Visit: Payer: Self-pay

## 2021-04-18 ENCOUNTER — Other Ambulatory Visit (HOSPITAL_COMMUNITY): Payer: Self-pay

## 2021-04-18 MED ORDER — HYOSCYAMINE SULFATE ER 0.375 MG PO TB12
0.3750 mg | ORAL_TABLET | Freq: Two times a day (BID) | ORAL | 5 refills | Status: DC | PRN
  Filled 2021-04-18 – 2021-06-05 (×2): qty 60, 30d supply, fill #0
  Filled 2021-06-06: qty 20, 10d supply, fill #0
  Filled 2021-06-06: qty 40, 20d supply, fill #0
  Filled 2021-07-29: qty 60, 30d supply, fill #1
  Filled 2021-10-09: qty 60, 30d supply, fill #2
  Filled 2021-12-12: qty 60, 30d supply, fill #3
  Filled 2022-02-11: qty 60, 30d supply, fill #4
  Filled 2022-04-13: qty 34, 17d supply, fill #5
  Filled 2022-04-15: qty 26, 13d supply, fill #5

## 2021-04-20 ENCOUNTER — Other Ambulatory Visit (HOSPITAL_COMMUNITY): Payer: Self-pay

## 2021-04-20 MED ORDER — VIBERZI 100 MG PO TABS
100.0000 mg | ORAL_TABLET | Freq: Two times a day (BID) | ORAL | 5 refills | Status: DC
  Filled 2021-04-20 – 2021-06-11 (×2): qty 60, 30d supply, fill #0
  Filled 2021-07-29: qty 60, 30d supply, fill #1

## 2021-04-27 ENCOUNTER — Telehealth: Payer: Self-pay

## 2021-04-27 NOTE — Telephone Encounter (Signed)
Patient portal message sent

## 2021-04-27 NOTE — Telephone Encounter (Signed)
FYI-  Patient has [place] and currently their decision is that gel shots are not medically necessary and will not be covering any hyaluronic acid injections at this time.  Patient may try TriVisc, which is a series of 3 and will cost the patient $250.00 OOP.

## 2021-05-02 ENCOUNTER — Other Ambulatory Visit (HOSPITAL_COMMUNITY): Payer: Self-pay

## 2021-05-08 ENCOUNTER — Other Ambulatory Visit (HOSPITAL_COMMUNITY): Payer: Self-pay

## 2021-05-08 MED ORDER — CARESTART COVID-19 HOME TEST VI KIT
PACK | 0 refills | Status: DC
  Filled 2021-05-08: qty 4, 4d supply, fill #0

## 2021-05-08 MED FILL — Azilsartan Medoxomil-Chlorthalidone Tab 40-25 MG: ORAL | 30 days supply | Qty: 30 | Fill #5 | Status: AC

## 2021-05-11 ENCOUNTER — Encounter (HOSPITAL_BASED_OUTPATIENT_CLINIC_OR_DEPARTMENT_OTHER): Payer: Self-pay | Admitting: Orthopedic Surgery

## 2021-05-17 ENCOUNTER — Other Ambulatory Visit (HOSPITAL_COMMUNITY): Payer: Self-pay

## 2021-05-17 ENCOUNTER — Other Ambulatory Visit: Payer: Self-pay | Admitting: Orthopedic Surgery

## 2021-05-17 MED ORDER — OXYCODONE HCL 5 MG PO TABS
5.0000 mg | ORAL_TABLET | ORAL | 0 refills | Status: DC | PRN
  Filled 2021-05-17: qty 30, 5d supply, fill #0

## 2021-05-17 MED ORDER — ONDANSETRON HCL 4 MG PO TABS
4.0000 mg | ORAL_TABLET | Freq: Three times a day (TID) | ORAL | 0 refills | Status: DC | PRN
  Filled 2021-05-17: qty 20, 7d supply, fill #0

## 2021-05-17 MED ORDER — METHOCARBAMOL 500 MG PO TABS
500.0000 mg | ORAL_TABLET | Freq: Four times a day (QID) | ORAL | 0 refills | Status: DC
  Filled 2021-05-17: qty 30, 8d supply, fill #0

## 2021-05-18 ENCOUNTER — Encounter (HOSPITAL_BASED_OUTPATIENT_CLINIC_OR_DEPARTMENT_OTHER)
Admission: RE | Admit: 2021-05-18 | Discharge: 2021-05-18 | Disposition: A | Discharge status: Home / Self Care | Payer: No Typology Code available for payment source | Source: Ambulatory Visit | Attending: Orthopedic Surgery | Admitting: Orthopedic Surgery

## 2021-05-18 DIAGNOSIS — Z01812 Encounter for preprocedural laboratory examination: Secondary | ICD-10-CM | POA: Insufficient documentation

## 2021-05-18 LAB — BASIC METABOLIC PANEL
Anion gap: 9 (ref 5–15)
BUN: 16 mg/dL (ref 8–23)
CO2: 26 mmol/L (ref 22–32)
Calcium: 9.8 mg/dL (ref 8.9–10.3)
Chloride: 101 mmol/L (ref 98–111)
Creatinine, Ser: 1.11 mg/dL — ABNORMAL HIGH (ref 0.44–1.00)
GFR, Estimated: 55 mL/min — ABNORMAL LOW (ref >60)
Glucose, Bld: 221 mg/dL — ABNORMAL HIGH (ref 70–99)
Potassium: 5 mmol/L (ref 3.5–5.1)
Sodium: 136 mmol/L (ref 135–145)

## 2021-05-18 LAB — CBC
HCT: 43.2 % (ref 36.0–46.0)
Hemoglobin: 13.5 g/dL (ref 12.0–15.0)
MCH: 26.4 pg (ref 26.0–34.0)
MCHC: 31.3 g/dL (ref 30.0–36.0)
MCV: 84.4 fL (ref 80.0–100.0)
Platelets: 203 10*3/uL (ref 150–400)
RBC: 5.12 MIL/uL — ABNORMAL HIGH (ref 3.87–5.11)
RDW: 13.5 % (ref 11.5–15.5)
WBC: 5.6 10*3/uL (ref 4.0–10.5)
nRBC: 0 % (ref 0.0–0.2)

## 2021-05-18 NOTE — Progress Notes (Signed)

## 2021-05-21 ENCOUNTER — Encounter (HOSPITAL_BASED_OUTPATIENT_CLINIC_OR_DEPARTMENT_OTHER): Payer: Self-pay | Admitting: Orthopedic Surgery

## 2021-05-21 ENCOUNTER — Ambulatory Visit (HOSPITAL_BASED_OUTPATIENT_CLINIC_OR_DEPARTMENT_OTHER)
Admission: RE | Admit: 2021-05-21 | Discharge: 2021-05-21 | Disposition: A | Discharge status: Home / Self Care | Payer: No Typology Code available for payment source | Source: Ambulatory Visit | Attending: Orthopedic Surgery | Admitting: Orthopedic Surgery

## 2021-05-21 ENCOUNTER — Ambulatory Visit (HOSPITAL_BASED_OUTPATIENT_CLINIC_OR_DEPARTMENT_OTHER): Payer: No Typology Code available for payment source | Admitting: Anesthesiology

## 2021-05-21 ENCOUNTER — Other Ambulatory Visit: Payer: Self-pay

## 2021-05-21 ENCOUNTER — Encounter (HOSPITAL_BASED_OUTPATIENT_CLINIC_OR_DEPARTMENT_OTHER)
Admission: RE | Disposition: A | Discharge status: Home / Self Care | Payer: Self-pay | Source: Ambulatory Visit | Attending: Orthopedic Surgery

## 2021-05-21 DIAGNOSIS — Z7982 Long term (current) use of aspirin: Secondary | ICD-10-CM | POA: Insufficient documentation

## 2021-05-21 DIAGNOSIS — Z7984 Long term (current) use of oral hypoglycemic drugs: Secondary | ICD-10-CM | POA: Diagnosis not present

## 2021-05-21 DIAGNOSIS — Z79899 Other long term (current) drug therapy: Secondary | ICD-10-CM | POA: Insufficient documentation

## 2021-05-21 DIAGNOSIS — Z7951 Long term (current) use of inhaled steroids: Secondary | ICD-10-CM | POA: Insufficient documentation

## 2021-05-21 DIAGNOSIS — Z794 Long term (current) use of insulin: Secondary | ICD-10-CM | POA: Insufficient documentation

## 2021-05-21 DIAGNOSIS — M75101 Unspecified rotator cuff tear or rupture of right shoulder, not specified as traumatic: Secondary | ICD-10-CM | POA: Diagnosis present

## 2021-05-21 DIAGNOSIS — S46011D Strain of muscle(s) and tendon(s) of the rotator cuff of right shoulder, subsequent encounter: Secondary | ICD-10-CM

## 2021-05-21 DIAGNOSIS — S46011A Strain of muscle(s) and tendon(s) of the rotator cuff of right shoulder, initial encounter: Secondary | ICD-10-CM

## 2021-05-21 HISTORY — PX: SHOULDER ARTHROSCOPY WITH OPEN ROTATOR CUFF REPAIR: SHX6092

## 2021-05-21 LAB — GLUCOSE, CAPILLARY
Glucose-Capillary: 175 mg/dL — ABNORMAL HIGH (ref 70–99)
Glucose-Capillary: 155 mg/dL — ABNORMAL HIGH (ref 70–99)

## 2021-05-21 SURGERY — ARTHROSCOPY, SHOULDER WITH REPAIR, ROTATOR CUFF, OPEN
Anesthesia: Regional | Site: Shoulder | Laterality: Right

## 2021-05-21 MED ORDER — CEFAZOLIN SODIUM-DEXTROSE 2-4 GM/100ML-% IV SOLN
INTRAVENOUS | Status: AC
  Filled 2021-05-21: qty 100

## 2021-05-21 MED ORDER — OXYCODONE HCL 5 MG PO TABS: 5.0000 mg | ORAL_TABLET | Freq: Once | ORAL | Status: DC | PRN

## 2021-05-21 MED ORDER — MEPERIDINE HCL 25 MG/ML IJ SOLN: 6.2500 mg | INTRAMUSCULAR | Status: DC | PRN

## 2021-05-21 MED ORDER — MIDAZOLAM HCL 2 MG/2ML IJ SOLN
INTRAMUSCULAR | Status: AC
  Filled 2021-05-21: qty 2

## 2021-05-21 MED ORDER — ONDANSETRON HCL 4 MG/2ML IJ SOLN
INTRAMUSCULAR | Status: DC | PRN
  Administered 2021-05-21: 4 mg via INTRAVENOUS

## 2021-05-21 MED ORDER — CEFAZOLIN SODIUM-DEXTROSE 2-4 GM/100ML-% IV SOLN
2.0000 g | INTRAVENOUS | Status: AC
  Administered 2021-05-21: 2 g via INTRAVENOUS

## 2021-05-21 MED ORDER — POVIDONE-IODINE 10 % EX SWAB
2.0000 "application " | Freq: Once | CUTANEOUS | Status: AC
  Administered 2021-05-21: 2 via TOPICAL

## 2021-05-21 MED ORDER — 0.9 % SODIUM CHLORIDE (POUR BTL) OPTIME
TOPICAL | Status: DC | PRN
  Administered 2021-05-21: 1000 mL

## 2021-05-21 MED ORDER — FENTANYL CITRATE (PF) 100 MCG/2ML IJ SOLN
INTRAMUSCULAR | Status: AC
  Filled 2021-05-21: qty 2

## 2021-05-21 MED ORDER — FENTANYL CITRATE (PF) 100 MCG/2ML IJ SOLN
100.0000 ug | Freq: Once | INTRAMUSCULAR | Status: AC
  Administered 2021-05-21: 50 ug via INTRAVENOUS

## 2021-05-21 MED ORDER — EPHEDRINE 5 MG/ML INJ
INTRAVENOUS | Status: AC
  Filled 2021-05-21: qty 5

## 2021-05-21 MED ORDER — FENTANYL CITRATE (PF) 100 MCG/2ML IJ SOLN
INTRAMUSCULAR | Status: DC | PRN
  Administered 2021-05-21: 100 ug via INTRAVENOUS

## 2021-05-21 MED ORDER — ATROPINE SULFATE 0.4 MG/ML IJ SOLN
INTRAMUSCULAR | Status: AC
  Filled 2021-05-21: qty 1

## 2021-05-21 MED ORDER — EPHEDRINE SULFATE 50 MG/ML IJ SOLN
INTRAMUSCULAR | Status: DC | PRN
  Administered 2021-05-21 (×3): 10 mg via INTRAVENOUS

## 2021-05-21 MED ORDER — SUGAMMADEX SODIUM 200 MG/2ML IV SOLN
INTRAVENOUS | Status: DC | PRN
  Administered 2021-05-21: 200 mg via INTRAVENOUS

## 2021-05-21 MED ORDER — VANCOMYCIN HCL 1000 MG IV SOLR
INTRAVENOUS | Status: DC | PRN
  Administered 2021-05-21: 1000 mg

## 2021-05-21 MED ORDER — PROPOFOL 500 MG/50ML IV EMUL
INTRAVENOUS | Status: AC
  Filled 2021-05-21: qty 50

## 2021-05-21 MED ORDER — LIDOCAINE 2% (20 MG/ML) 5 ML SYRINGE
INTRAMUSCULAR | Status: AC
  Filled 2021-05-21: qty 5

## 2021-05-21 MED ORDER — PROPOFOL 10 MG/ML IV BOLUS
INTRAVENOUS | Status: DC | PRN
  Administered 2021-05-21: 150 mg via INTRAVENOUS

## 2021-05-21 MED ORDER — ACETAMINOPHEN 500 MG PO TABS
1000.0000 mg | ORAL_TABLET | Freq: Once | ORAL | Status: AC
  Administered 2021-05-21: 1000 mg via ORAL

## 2021-05-21 MED ORDER — ROCURONIUM BROMIDE 100 MG/10ML IV SOLN
INTRAVENOUS | Status: DC | PRN
  Administered 2021-05-21: 10 mg via INTRAVENOUS
  Administered 2021-05-21: 60 mg via INTRAVENOUS

## 2021-05-21 MED ORDER — HYDROMORPHONE HCL 1 MG/ML IJ SOLN: 0.2500 mg | INTRAMUSCULAR | Status: DC | PRN

## 2021-05-21 MED ORDER — PHENYLEPHRINE 40 MCG/ML (10ML) SYRINGE FOR IV PUSH (FOR BLOOD PRESSURE SUPPORT)
PREFILLED_SYRINGE | INTRAVENOUS | Status: AC
  Filled 2021-05-21: qty 10

## 2021-05-21 MED ORDER — SODIUM CHLORIDE 0.9 % IR SOLN
Status: DC | PRN
  Administered 2021-05-21: 3000 mL

## 2021-05-21 MED ORDER — SUCCINYLCHOLINE CHLORIDE 200 MG/10ML IV SOSY
PREFILLED_SYRINGE | INTRAVENOUS | Status: AC
  Filled 2021-05-21: qty 10

## 2021-05-21 MED ORDER — OXYCODONE HCL 5 MG/5ML PO SOLN: 5.0000 mg | Freq: Once | ORAL | Status: DC | PRN

## 2021-05-21 MED ORDER — MIDAZOLAM HCL 2 MG/2ML IJ SOLN: 0.5000 mg | Freq: Once | INTRAMUSCULAR | Status: DC | PRN

## 2021-05-21 MED ORDER — ROCURONIUM BROMIDE 10 MG/ML (PF) SYRINGE
PREFILLED_SYRINGE | INTRAVENOUS | Status: AC
  Filled 2021-05-21: qty 10

## 2021-05-21 MED ORDER — DEXAMETHASONE SODIUM PHOSPHATE 10 MG/ML IJ SOLN
INTRAMUSCULAR | Status: AC
  Filled 2021-05-21: qty 1

## 2021-05-21 MED ORDER — BUPIVACAINE HCL (PF) 0.5 % IJ SOLN
INTRAMUSCULAR | Status: DC | PRN
  Administered 2021-05-21: 15 mL via PERINEURAL

## 2021-05-21 MED ORDER — BUPIVACAINE LIPOSOME 1.3 % IJ SUSP
INTRAMUSCULAR | Status: DC | PRN
  Administered 2021-05-21: 10 mL via PERINEURAL

## 2021-05-21 MED ORDER — LACTATED RINGERS IV SOLN: INTRAVENOUS | Status: DC

## 2021-05-21 MED ORDER — DEXAMETHASONE SODIUM PHOSPHATE 4 MG/ML IJ SOLN
INTRAMUSCULAR | Status: DC | PRN
  Administered 2021-05-21: 5 mg via INTRAVENOUS

## 2021-05-21 MED ORDER — EPINEPHRINE PF 1 MG/ML IJ SOLN
INTRAMUSCULAR | Status: AC
  Filled 2021-05-21: qty 1

## 2021-05-21 MED ORDER — ONDANSETRON HCL 4 MG/2ML IJ SOLN
INTRAMUSCULAR | Status: AC
  Filled 2021-05-21: qty 2

## 2021-05-21 MED ORDER — PROMETHAZINE HCL 25 MG/ML IJ SOLN: 6.2500 mg | INTRAMUSCULAR | Status: DC | PRN

## 2021-05-21 MED ORDER — LIDOCAINE HCL (CARDIAC) PF 100 MG/5ML IV SOSY
PREFILLED_SYRINGE | INTRAVENOUS | Status: DC | PRN
Start: 2021-05-21 — End: 2021-05-21
  Administered 2021-05-21: 20 mg via INTRAVENOUS

## 2021-05-21 MED ORDER — PHENYLEPHRINE HCL-NACL 20-0.9 MG/250ML-% IV SOLN
INTRAVENOUS | Status: DC | PRN
  Administered 2021-05-21: 40 ug/min via INTRAVENOUS

## 2021-05-21 MED ORDER — MIDAZOLAM HCL 2 MG/2ML IJ SOLN
2.0000 mg | Freq: Once | INTRAMUSCULAR | Status: AC
  Administered 2021-05-21: 2 mg via INTRAVENOUS

## 2021-05-21 MED ORDER — BUPIVACAINE HCL (PF) 0.5 % IJ SOLN
INTRAMUSCULAR | Status: AC
  Filled 2021-05-21: qty 30

## 2021-05-21 MED ORDER — POVIDONE-IODINE 7.5 % EX SOLN
Freq: Once | CUTANEOUS | Status: AC
  Filled 2021-05-21: qty 118

## 2021-05-21 MED ORDER — ACETAMINOPHEN 500 MG PO TABS
ORAL_TABLET | ORAL | Status: AC
  Filled 2021-05-21: qty 2

## 2021-05-21 SURGICAL SUPPLY — 87 items
AID PSTN UNV HD RSTRNT DISP (MISCELLANEOUS) ×1
ANCH SUT 2 SWLK 19.1 CLS EYLT (Anchor) ×2 IMPLANT
ANCH SUT CRKSW FT 1.3X (Anchor) ×2 IMPLANT
ANCHOR SUT BIOCOMP CORKSREW (Anchor) ×4 IMPLANT
ANCHOR SWIVELOCK BIO 4.75X19.1 (Anchor) ×4 IMPLANT
BLADE EXCALIBUR 4.0X13 (MISCELLANEOUS) IMPLANT
BLADE SHAVER BONE 5.0X13 (MISCELLANEOUS) IMPLANT
BLADE SURG 10 STRL SS (BLADE) ×4 IMPLANT
BLADE SURG 15 STRL LF DISP TIS (BLADE) ×2 IMPLANT
BLADE SURG 15 STRL SS (BLADE) ×4
BURR OVAL 8 FLU 5.0X13 (MISCELLANEOUS) IMPLANT
CANNULA 5.75X71 LONG (CANNULA) IMPLANT
CANNULA TWIST IN 8.25X7CM (CANNULA) IMPLANT
COOLER ICEMAN CLASSIC (MISCELLANEOUS) ×2 IMPLANT
DECANTER SPIKE VIAL GLASS SM (MISCELLANEOUS) IMPLANT
DISSECTOR  3.8MM X 13CM (MISCELLANEOUS)
DISSECTOR 3.8MM X 13CM (MISCELLANEOUS) IMPLANT
DISSECTOR 4.0MM X 13CM (MISCELLANEOUS) ×2 IMPLANT
DRAPE IMP U-DRAPE 54X76 (DRAPES) IMPLANT
DRAPE INCISE IOBAN 66X45 STRL (DRAPES) ×4 IMPLANT
DRAPE STERI 35X30 U-POUCH (DRAPES) ×2 IMPLANT
DRAPE U-SHAPE 47X51 STRL (DRAPES) ×4 IMPLANT
DRAPE U-SHAPE 76X120 STRL (DRAPES) ×4 IMPLANT
DRSG AQUACEL AG ADV 3.5X 6 (GAUZE/BANDAGES/DRESSINGS) IMPLANT
DRSG TEGADERM 4X4.75 (GAUZE/BANDAGES/DRESSINGS) ×2 IMPLANT
DURAPREP 26ML APPLICATOR (WOUND CARE) ×4 IMPLANT
DW OUTFLOW CASSETTE/TUBE SET (MISCELLANEOUS) ×2 IMPLANT
ELECT REM PT RETURN 9FT ADLT (ELECTROSURGICAL) ×2
ELECTRODE REM PT RTRN 9FT ADLT (ELECTROSURGICAL) ×1 IMPLANT
EXCALIBUR 3.8MM X 13CM (MISCELLANEOUS) IMPLANT
GAUZE SPONGE 4X4 12PLY STRL (GAUZE/BANDAGES/DRESSINGS) ×2 IMPLANT
GAUZE XEROFORM 1X8 LF (GAUZE/BANDAGES/DRESSINGS) ×2 IMPLANT
GLOVE SRG 8 PF TXTR STRL LF DI (GLOVE) ×1 IMPLANT
GLOVE SURG ENC MOIS LTX SZ7 (GLOVE) ×2 IMPLANT
GLOVE SURG ENC MOIS LTX SZ8 (GLOVE) ×2 IMPLANT
GLOVE SURG UNDER POLY LF SZ7 (GLOVE) ×2 IMPLANT
GLOVE SURG UNDER POLY LF SZ8 (GLOVE) ×2
GOWN STRL REUS W/ TWL LRG LVL3 (GOWN DISPOSABLE) ×2 IMPLANT
GOWN STRL REUS W/ TWL XL LVL3 (GOWN DISPOSABLE) ×1 IMPLANT
GOWN STRL REUS W/TWL LRG LVL3 (GOWN DISPOSABLE) ×4
GOWN STRL REUS W/TWL XL LVL3 (GOWN DISPOSABLE) ×2
KIT STR SPEAR 1.8 FBRTK DISP (KITS) IMPLANT
MANIFOLD NEPTUNE II (INSTRUMENTS) ×2 IMPLANT
NDL SAFETY ECLIPSE 18X1.5 (NEEDLE) ×1 IMPLANT
NDL SUT 6 .5 CRC .975X.05 MAYO (NEEDLE) ×1 IMPLANT
NEEDLE HYPO 18GX1.5 SHARP (NEEDLE) ×2
NEEDLE MAYO TAPER (NEEDLE) ×2
NEEDLE SCORPION MULTI FIRE (NEEDLE) IMPLANT
NS IRRIG 1000ML POUR BTL (IV SOLUTION) IMPLANT
PACK ARTHROSCOPY DSU (CUSTOM PROCEDURE TRAY) ×2 IMPLANT
PACK BASIN DAY SURGERY FS (CUSTOM PROCEDURE TRAY) ×2 IMPLANT
PAD COLD SHLDR WRAP-ON (PAD) ×2 IMPLANT
PENCIL SMOKE EVACUATOR (MISCELLANEOUS) ×2 IMPLANT
PORT APPOLLO RF 90DEGREE MULTI (SURGICAL WAND) ×2 IMPLANT
RESTRAINT HEAD UNIVERSAL NS (MISCELLANEOUS) ×2 IMPLANT
SLEEVE SCD COMPRESS KNEE MED (STOCKING) ×2 IMPLANT
SLING ARM FOAM STRAP LRG (SOFTGOODS) IMPLANT
SLING ARM IMMOBILIZER LRG (SOFTGOODS) ×2 IMPLANT
SPONGE SURGIFOAM ABS GEL 12-7 (HEMOSTASIS) IMPLANT
SPONGE T-LAP 4X18 ~~LOC~~+RFID (SPONGE) ×8 IMPLANT
STRIP CLOSURE SKIN 1/2X4 (GAUZE/BANDAGES/DRESSINGS) IMPLANT
SUCTION FRAZIER HANDLE 10FR (MISCELLANEOUS) ×2
SUCTION TUBE FRAZIER 10FR DISP (MISCELLANEOUS) ×1 IMPLANT
SUT BONE WAX W31G (SUTURE) IMPLANT
SUT ETHIBOND 2 OS 4 DA (SUTURE) IMPLANT
SUT ETHILON 3 0 PS 1 (SUTURE) ×2 IMPLANT
SUT FIBERWIRE #2 38 T-5 BLUE (SUTURE) ×4
SUT FIBERWIRE 2-0 18 17.9 3/8 (SUTURE)
SUT MNCRL AB 3-0 PS2 18 (SUTURE) ×2 IMPLANT
SUT PDS AB 0 CT 36 (SUTURE) IMPLANT
SUT TICRON 1 T 12 (SUTURE) IMPLANT
SUT VIC AB 0 CT1 27 (SUTURE)
SUT VIC AB 0 CT1 27XCR 8 STRN (SUTURE) IMPLANT
SUT VIC AB 1 CT1 27 (SUTURE) ×4
SUT VIC AB 1 CT1 27XBRD ANBCTR (SUTURE) ×2 IMPLANT
SUT VIC AB 2-0 SH 27 (SUTURE) ×2
SUT VIC AB 2-0 SH 27XBRD (SUTURE) ×1 IMPLANT
SUT VICRYL 0 UR6 27IN ABS (SUTURE) ×10 IMPLANT
SUT VICRYL 4-0 PS2 18IN ABS (SUTURE) ×2 IMPLANT
SUTURE FIBERWR #2 38 T-5 BLUE (SUTURE) ×2 IMPLANT
SUTURE FIBERWR 2-0 18 17.9 3/8 (SUTURE) IMPLANT
SYR 5ML LL (SYRINGE) ×2 IMPLANT
SYR BULB IRRIG 60ML STRL (SYRINGE) IMPLANT
TOWEL GREEN STERILE FF (TOWEL DISPOSABLE) ×2 IMPLANT
TUBE CONNECTING 20X1/4 (TUBING) ×2 IMPLANT
TUBING ARTHROSCOPY IRRIG 16FT (MISCELLANEOUS) ×2 IMPLANT
YANKAUER SUCT BULB TIP NO VENT (SUCTIONS) ×2 IMPLANT

## 2021-05-21 NOTE — Anesthesia Postprocedure Evaluation (Signed)
Anesthesia Post Note  Patient: Heather Stout  Procedure(s) Performed: RIGHT SHOULDER ARTHROSCOPY WITH OPEN ROTATOR CUFF REPAIR (Right: Shoulder)     Patient location during evaluation: PACU Anesthesia Type: Regional and General Level of consciousness: awake and alert, patient cooperative and oriented Pain management: pain level controlled Vital Signs Assessment: post-procedure vital signs reviewed and stable Respiratory status: spontaneous breathing, nonlabored ventilation and respiratory function stable Cardiovascular status: blood pressure returned to baseline and stable Postop Assessment: no apparent nausea or vomiting Anesthetic complications: no   No notable events documented.  Last Vitals:  Vitals:   05/21/21 1045 05/21/21 1100  BP: (!) 102/55 (!) 97/44  Pulse: 88 78  Resp: 20 (!) 22  Temp:    SpO2: 95% 92%    Last Pain:  Vitals:   05/21/21 1035  TempSrc:   PainSc: Asleep                 Naseer Hearn,E. Tenley Winward

## 2021-05-21 NOTE — Anesthesia Preprocedure Evaluation (Addendum)
Anesthesia Evaluation  Patient identified by MRN, date of birth, ID band Patient awake    Reviewed: Allergy & Precautions, NPO status , Patient's Chart, lab work & pertinent test results  Airway Mallampati: II  TM Distance: <3 FB Neck ROM: Full    Dental  (+) Teeth Intact, Dental Advisory Given   Pulmonary asthma , sleep apnea and Continuous Positive Airway Pressure Ventilation ,    Pulmonary exam normal breath sounds clear to auscultation       Cardiovascular hypertension, Pt. on medications Normal cardiovascular exam Rhythm:Regular Rate:Normal     Neuro/Psych negative neurological ROS  negative psych ROS   GI/Hepatic Neg liver ROS, GERD  ,  Endo/Other  diabetes (glu 221), Well Controlled, Type 2, Oral Hypoglycemic Agents  Renal/GU negative Renal ROS     Musculoskeletal negative musculoskeletal ROS (+)   Abdominal   Peds negative pediatric ROS (+)  Hematology negative hematology ROS (+) Hgb 13.7   Anesthesia Other Findings   Reproductive/Obstetrics                            Anesthesia Physical  Anesthesia Plan  ASA: 3  Anesthesia Plan: General and Regional   Post-op Pain Management:  Regional for Post-op pain and GA combined w/ Regional for post-op pain   Induction: Intravenous  PONV Risk Score and Plan: Treatment may vary due to age or medical condition, Ondansetron, Dexamethasone and Midazolam  Airway Management Planned: Oral ETT  Additional Equipment: None  Intra-op Plan:   Post-operative Plan: Extubation in OR  Informed Consent: I have reviewed the patients History and Physical, chart, labs and discussed the procedure including the risks, benefits and alternatives for the proposed anesthesia with the patient or authorized representative who has indicated his/her understanding and acceptance.     Dental advisory given  Plan Discussed with: CRNA, Anesthesiologist and  Surgeon  Anesthesia Plan Comments: (GA w R ISB w Exparel)        Anesthesia Quick Evaluation

## 2021-05-21 NOTE — Brief Op Note (Signed)
   05/21/2021  12:10 PM  PATIENT:  Heather Stout  65 y.o. female  PRE-OPERATIVE DIAGNOSIS:  right shoulder rotator cuff tear  POST-OPERATIVE DIAGNOSIS:  right shoulder rotator cuff tear  PROCEDURE:  Procedure(s): RIGHT SHOULDER ARTHROSCOPY WITH OPEN ROTATOR CUFF REPAIR  SURGEON:  Surgeon(s): Meredith Pel, MD  ASSISTANT: magnant pa  ANESTHESIA:   general  EBL: 20 ml    Total I/O In: 320 [P.O.:120; I.V.:100; IV Piggyback:100] Out: 30 [Blood:30]  BLOOD ADMINISTERED: none  DRAINS: none   LOCAL MEDICATIONS USED:  none  SPECIMEN:  No Specimen  COUNTS:  YES  TOURNIQUET:  * No tourniquets in log *  DICTATION: .Other Dictation: Dictation Number [id number]  PLAN OF CARE: Discharge to home after PACU  PATIENT DISPOSITION:  PACU - hemodynamically stable

## 2021-05-21 NOTE — Discharge Instructions (Addendum)
Next dose of Tylenol can be given at 1:00pm if needed.   Post Anesthesia Home Care Instructions  Activity: Get plenty of rest for the remainder of the day. A responsible individual must stay with you for 24 hours following the procedure.  For the next 24 hours, DO NOT: -Drive a car -Paediatric nurse -Drink alcoholic beverages -Take any medication unless instructed by your physician -Make any legal decisions or sign important papers.  Meals: Start with liquid foods such as gelatin or soup. Progress to regular foods as tolerated. Avoid greasy, spicy, heavy foods. If nausea and/or vomiting occur, drink only clear liquids until the nausea and/or vomiting subsides. Call your physician if vomiting continues.  Special Instructions/Symptoms: Your throat may feel dry or sore from the anesthesia or the breathing tube placed in your throat during surgery. If this causes discomfort, gargle with warm salt water. The discomfort should disappear within 24 hours.   Regional Anesthesia Blocks  1. Numbness or the inability to move the "blocked" extremity may last from 3-48 hours after placement. The length of time depends on the medication injected and your individual response to the medication. If the numbness is not going away after 48 hours, call your surgeon.  2. The extremity that is blocked will need to be protected until the numbness is gone and the  Strength has returned. Because you cannot feel it, you will need to take extra care to avoid injury. Because it may be weak, you may have difficulty moving it or using it. You may not know what position it is in without looking at it while the block is in effect.  3. For blocks in the legs and feet, returning to weight bearing and walking needs to be done carefully. You will need to wait until the numbness is entirely gone and the strength has returned. You should be able to move your leg and foot normally before you try and bear weight or walk. You  will need someone to be with you when you first try to ensure you do not fall and possibly risk injury.  4. Bruising and tenderness at the needle site are common side effects and will resolve in a few days.  5. Persistent numbness or new problems with movement should be communicated to the surgeon or the [facility] [phone] [place] [phone]).   Information for Discharge Teaching: EXPAREL (bupivacaine liposome injectable suspension)   Your surgeon or anesthesiologist gave you EXPAREL(bupivacaine) to help control your pain after surgery.  EXPAREL is a local anesthetic that provides pain relief by numbing the tissue around the surgical site. EXPAREL is designed to release pain medication over time and can control pain for up to 72 hours. Depending on how you respond to EXPAREL, you may require less pain medication during your recovery.  Possible side effects: Temporary loss of sensation or ability to move in the area where bupivacaine was injected. Nausea, vomiting, constipation Rarely, numbness and tingling in your mouth or lips, lightheadedness, or anxiety may occur. Call your doctor right away if you think you may be experiencing any of these sensations, or if you have other questions regarding possible side effects.  Follow all other discharge instructions given to you by your surgeon or nurse. Eat a healthy diet and drink plenty of water or other fluids.  If you return to the hospital for any reason within 96 hours following the administration of EXPAREL, it is important for health care providers to know that  you have received this anesthetic. A teal colored band has been placed on your arm with the date, time and amount of EXPAREL you have received in order to alert and inform your health care providers. Please leave this armband in place for the full 96 hours following administration, and then you may remove the band.

## 2021-05-21 NOTE — Progress Notes (Signed)
Assisted Dr. Elgie Congo with right, ultrasound guided, interscalene  block. Side rails up, monitors on throughout procedure. See vital signs in flow sheet. Tolerated Procedure well.

## 2021-05-21 NOTE — Transfer of Care (Signed)
Immediate Anesthesia Transfer of Care Note  Patient: Heather Stout  Procedure(s) Performed: RIGHT SHOULDER ARTHROSCOPY WITH OPEN ROTATOR CUFF REPAIR (Right: Shoulder)  Patient Location: PACU  Anesthesia Type:General and GA combined with regional for post-op pain  Level of Consciousness: sedated  Airway & Oxygen Therapy: Patient Spontanous Breathing and Patient connected to face mask oxygen  Post-op Assessment: Report given to RN and Post -op Vital signs reviewed and stable  Post vital signs: Reviewed and stable  Last Vitals:  Vitals Value Taken Time  BP    Temp    Pulse 83 05/21/21 1036  Resp    SpO2 95 % 05/21/21 1036  Vitals shown include unvalidated device data.  Last Pain:  Vitals:   05/21/21 0639  TempSrc: Oral  PainSc: 0-No pain      Patients Stated Pain Goal: 4 (90/93/11 2162)  Complications: No notable events documented.

## 2021-05-21 NOTE — Op Note (Signed)
NAME: Heather Stout, Heather Stout MEDICAL RECORD NO: [id number] ACCOUNT NO: [id number] DATE OF BIRTH: November 09, 1955 FACILITY: [facility] LOCATION: [facility] PHYSICIAN: Yetta Barre. Marlou Sa, MD  Operative Report   DATE OF PROCEDURE: 05/21/2021  PREOPERATIVE DIAGNOSES:  Right shoulder recurrent rotator cuff tear.  POSTOPERATIVE DIAGNOSES:  Right shoulder recurrent rotator cuff tear.  PROCEDURE:  Right shoulder arthroscopy with mini open rotator cuff tear repair.  SURGEON ATTENDING:  Yetta Barre. Marlou Sa, MD  ASSISTANT:  Annie Main, PA.  INDICATIONS:  The patient is a 65 year old patient who underwent right shoulder rotator cuff tear a year ago.  She was lifting a heavy bag of potting soil and felt pain and injury to the right shoulder.  MRI scan demonstrates a retear of the rotator cuff  repair, presents now for operative management after explanation of risks and benefits.  DESCRIPTION OF PROCEDURE:  The patient was brought to the operating room where general anesthetic was induced.  Preoperative antibiotics administered.  Timeout was called.  The patient was placed in the beach chair position with the head in neutral  position.  Right shoulder, arm and hand pre-scrubbed with alcohol, Betadine, and allowed to air dry, prepped with DuraPrep solution and draped in a sterile manner.  Ioban used to cover the axilla and seal the operative field.  A timeout was called.   Posterior portal created 2 cm medial and inferior to the posterolateral margin of the acromion.  Diagnostic arthroscopy was performed.  The patient did have recurrent rotator cuff tear, but generally intact articular surfaces.  Mild grade 1  chondromalacia over about 15% of the glenoid and 15% of the humeral head.  no loose chondral flaps.  Subscap, articular are intact.  Prior biceps tenodesis took the biceps tendon out of the joint.  Majority of the infraspinatus intact.  Recurrent tear,  medial to the prior repair was visualized.  Sutures were  visualized.  At this time, instrument was removed.  Portal closed using 3-0 nylon.  Charlie Pitter was then used to cover the entire operative field, prior incision utilized.  Skin and subcutaneous tissue  were sharply divided.  The deltoid was split and measured distance of 4 cm was marked with #1 Vicryl suture between the anterior middle raphae on the deltoid off the anterolateral margin of the acromion.  Bursectomy performed.  No evidence of infection  within the joint.  Bursa was thick.  Revision acromioplasty performed with a rasp.  Next, the sutures were removed.  The SutureTaks removed from the medial aspect and the sutures were then removed from the lateral aspect where the SwiveLocks were placed.   At this time, debridement was performed of devitalized rotator cuff tissue primarily attached to the greater tuberosity.  Rasp was then used to perform tuberoplasty.  Thorough irrigation was performed.  Next, the A-shaped tear was mobilized.  We  released the coracohumeral ligament.  Next, 2 stay sutures placed in the posterior limb, 2 stay suture was placed in the anterior limb.  At this time four 2-0 FiberWire sutures were placed in inverted fashion in order to close up the A shaped split  medially.  Two corkscrew anchors were placed.  They were placed in the prior holes and good fixation was achieved.  Next, the medial 2-0 FiberWire sutures were tied in an inverted fashion.  They were used to hold the rotator cuff tissue up while the 8  suture tapes were placed through the rotator cuff tendon.  Once this was completed, the final 2 interval sutures of  2-0 FiberWire were tied.  These 4 limbs were not cut, but incorporated into the SwiveLocks.  The suture tapes were tied and crossed, into  each SwiveLock was placed 4 tapes 4-0 Vicryl ins and four 2-0 FiberWire ins.  This gave a very nice, watertight repair.  The arm was adducted and the SwiveLocks were placed.  Good fixation was achieved.  At this time, thorough  irrigation was performed.   The self-retaining retractor was released.  Vancomycin powder placed.  The deltoid split was closed using #1 Vicryl suture followed by vancomycin powder, 0 Vicryl suture, 2-0 Vicryl suture, 3-0 Monocryl, Steri-Strips, and impervious dressings applied.   The patient tolerated the procedure well without immediate complication.  Luke's assistance was required for opening, closing, retraction.  His assistance was a medical necessity.   PUS D: 05/21/2021 12:16:07 pm T: 05/21/2021 7:51:00 pm  JOB: [id number]/ [id number]

## 2021-05-21 NOTE — H&P (Signed)
Heather Stout is an 65 y.o. female.   Chief Complaint: Right shoulder pain HPI: Heather Stout is a 65 year old patient with right shoulder pain.  She underwent right shoulder rotator cuff repair a year ago.  Was doing well until she lifted a very heavy bag of potting soil.  Felt pain in that right shoulder.  Subsequent MRI scan demonstrates recurrent rotator cuff tear.  Presents now for operative management after explanation risk benefits and failure of conservative treatment.  She does describe daily pain as well as symptoms affecting her activity and activities of daily living.  Past Medical History:  Diagnosis Date   Asthma    Essential hypertension, benign    GERD (gastroesophageal reflux disease)    IBS (irritable bowel syndrome)    Left anterior hemiblock    Other and unspecified hyperlipidemia    Sleep apnea    cpap   Type II or unspecified type diabetes mellitus without mention of complication, not stated as uncontrolled     Past Surgical History:  Procedure Laterality Date   FUNCTIONAL ENDOSCOPIC SINUS SURGERY  1990   LUMBAR LAMINECTOMY  2002   LUMBAR LAMINECTOMY  2005   SHOULDER ARTHROSCOPY WITH OPEN ROTATOR CUFF REPAIR Right 05/15/2020   Procedure: right shoulder arthroscopy, debridement, mini open rotator cuff tear repair, biceps tenodesis;  Surgeon: Meredith Pel, MD;  Location: [facility];  Service: Orthopedics;  Laterality: Right;   TONSILLECTOMY  1980    Family History  Problem Relation Age of Onset   Hypertension Father    Hypertension Brother    Diabetes Brother    Hypertension Brother    Crohn's disease Brother    Hypertension Sister    Diabetes Sister    Social History:  reports that she has never smoked. She has never used smokeless tobacco. She reports that she does not drink alcohol and does not use drugs.  Allergies: No Known Allergies  Medications Prior to Admission  Medication Sig Dispense Refill   aspirin 81 MG tablet Take 81 mg  by mouth daily.     Azilsartan-Chlorthalidone 40-25 MG TABS TAKE 1 TABLET BY MOUTH ONCE DAILY. 30 tablet 9   Calcium Carbonate-Vitamin D 500-125 MG-UNIT TABS Take by mouth.     cetirizine (ZYRTEC) 10 MG tablet Take 10 mg by mouth daily.     cholecalciferol (VITAMIN D) 1000 UNITS tablet Take 2,000 Units by mouth daily.      Cyanocobalamin (VITAMIN B 12 PO) Take by mouth.     Dapagliflozin-metFORMIN HCl ER (XIGDUO XR) 10-500 MG TB24 Take 1 tablet by mouth daily. 90 tablet 2   Eluxadoline (VIBERZI) 100 MG TABS Viberzi 100 mg tablet     Eluxadoline (VIBERZI) 100 MG TABS Take 1 tablet (100 mg total) by mouth 2 (two) times daily. 60 tablet 5   fenofibrate (TRICOR) 145 MG tablet TAKE 1 TABLET BY MOUTH ONCE DAILY. 90 tablet 1   HUMALOG KWIKPEN 100 UNIT/ML KwikPen SMARTSIG:Unit(s) SUB-Q     hyoscyamine (LEVBID) 0.375 MG 12 hr tablet Take 1 tablet (0.375 mg total) by mouth every 12 (twelve) hours as needed for symptoms 60 tablet 5   insulin degludec (TRESIBA FLEXTOUCH) 200 UNIT/ML FlexTouch Pen Inject 24 Units into the skin daily. 18 mL 11   Insulin Degludec (TRESIBA [place]) Inject into the skin.     insulin lispro (HUMALOG) 100 UNIT/ML KwikPen INJECT 15 UNITS INTO THE SKIN THREE TIMES DAILY BEFORE MEALS PLUS CORRECTIONAL UP TO 60 UNITS PER DAY. 18 mL 3  insulin lispro (HUMALOG) 100 UNIT/ML KwikPen Inject  10-15 units into the skin 3 times daily before meals plus correctional up to 60 units daily before meals. 18 mL 3   Magnesium 250 MG TABS Take 1 tablet by mouth.     omeprazole (PRILOSEC) 40 MG capsule TAKE 1 CAPSULE BY MOUTH ONCE A DAY 90 capsule 3   Probiotic Product (PROBIOTIC PO) Take 1 capsule by mouth.     Semaglutide (OZEMPIC, 1 MG/DOSE, [place]) Inject into the skin.     Semaglutide, 1 MG/DOSE, (OZEMPIC, 1 MG/DOSE,) 4 MG/3ML SOPN Inject 1 mg into the skin once a week. 3 mL 5   Semaglutide, 1 MG/DOSE, 4 MG/3ML SOPN INJECT 1 MG INTO THE SKIN ONCE A WEEK 3 mL 6   Triamcinolone Acetonide (NASACORT ALLERGY  24HR NA) Place into the nose.     albuterol (PROVENTIL HFA;VENTOLIN HFA) 108 (90 BASE) MCG/ACT inhaler Inhale into the lungs every 6 (six) hours as needed for wheezing or shortness of breath.     Azilsartan-Chlorthalidone 40-25 MG TABS TAKE 1 TABLET BY MOUTH ONCE DAILY 30 tablet 9   ciclesonide (ALVESCO) 160 MCG/ACT inhaler 1 inhalation 2 times per day with spacer 6.1 g 5   Continuous Blood Gluc Sensor (FREESTYLE LIBRE 14 DAY SENSOR) MISC Inject 1 into the skin every 14 (fourteen) days as directed 2 each 3   COVID-19 At Home Antigen Test (CARESTART COVID-19 HOME TEST) KIT Use as directed 4 each 0   dapagliflozin propanediol (FARXIGA) 10 MG TABS tablet Take 10 mg by mouth daily.     Dapagliflozin-metFORMIN HCl (XIGDUO XR PO) Take by mouth.     Dapagliflozin-metFORMIN HCl ER 10-500 MG TB24 TAKE 1 TABLET BY MOUTH ONCE DAILY 90 tablet 0   fenofibrate (TRICOR) 145 MG tablet Take 1 tablet (145 mg total) by mouth daily. 90 tablet 3   hyoscyamine (LEVBID) 0.375 MG 12 hr tablet TAKE 1 TABLET BY MOUTH EVERY 12 HOURS AS NEEDED FOR SYMPTOMS 60 tablet 12   insulin lispro (HUMALOG) 100 UNIT/ML KwikPen INJECT 15 UNITS UNDER THE SKIN THREE TIMES DAILY BEFORE MEALS PLUS CORRECTIONAL UP TO 60 UNITS PER DAY 18 mL 3   Insulin Pen Needle 31G X 5 MM MISC Use as directed 3 to 4 times daily with insulin. 400 each 3   ipratropium (ATROVENT) 0.06 % nasal spray Place 2 sprays into both nostrils 3 (three) times daily for 14 days. 15 mL 2   methocarbamol (ROBAXIN) 500 MG tablet Take 1 tablet (500 mg total) by mouth 4 (four) times daily. 30 tablet 0   ondansetron (ZOFRAN) 4 MG tablet Take 1 tablet (4 mg total) by mouth every 8 (eight) hours as needed for nausea or vomiting. 20 tablet 0   oxyCODONE (OXY IR/ROXICODONE) 5 MG immediate release tablet Take 1 tablet (5 mg total) by mouth every 4 (four) hours as needed. 30 tablet 0   Semaglutide, 1 MG/DOSE, 4 MG/3ML SOPN INJECT 1 MG INTO THE SKIN ONCE A WEEK. 3 mL 1    Results for  orders placed or performed during the hospital encounter of 05/21/21 (from the past 48 hour(s))  Glucose, capillary     Status: Abnormal   Collection Time: 05/21/21  6:39 AM  Result Value Ref Range   Glucose-Capillary 175 (H) 70 - 99 mg/dL    Comment: Glucose reference range applies only to samples taken after fasting for at least 8 hours.   No results found.  Review of Systems  Musculoskeletal:  Positive  for arthralgias.  All other systems reviewed and are negative.  Blood pressure 119/61, pulse 63, temperature 98.6 F (37 C), temperature source Oral, resp. rate 16, height _0  (1.702 m), weight 84.4 kg, SpO2 96 %. Physical Exam Vitals reviewed.  HENT:     Head: Normocephalic.     Nose: Nose normal.     Mouth/Throat:     Mouth: Mucous membranes are moist.  Eyes:     Pupils: Pupils are equal, round, and reactive to light.  Cardiovascular:     Rate and Rhythm: Normal rate.     Pulses: Normal pulses.  Pulmonary:     Effort: Pulmonary effort is normal.  Abdominal:     General: Abdomen is flat.  Musculoskeletal:     Cervical back: Normal range of motion.  Skin:    General: Skin is warm.     Capillary Refill: Capillary refill takes less than 2 seconds.  Neurological:     General: No focal deficit present.     Mental Status: She is alert.  Psychiatric:        Mood and Affect: Mood normal.    Right shoulder examination demonstrates maintained passive range of motion with some coarse grinding with internal and external rotation of the right shoulder.  Subscap strength infraspinatus strength intact.  Supraspinatus strength slightly weaker 5 out of 5 on the right compared to 5+ out of 5 on the left.  No restriction of passive range of motion at this time on the right-hand side with range of motion approximately 50/90/165.  AC joint nontender. Assessment/Plan Impression is recurrent rotator cuff tear following significant exertion.  Prior rotator cuff tear was slightly atypical in  terms of the location of the tear.  This recurrent tear may or may not be fixable.  May require patch augmentation.  She does have a pretty good force couple anteriorly and posteriorly.  The risk and benefits of the procedure are discussed including not limited to infection nerve vessel damage incomplete restoration of function and incomplete pain relief.  Patient understands risk and benefits and wishes to proceed.  All questions answered.  Anderson Malta, MD 05/21/2021, 7:17 AM

## 2021-05-21 NOTE — Anesthesia Procedure Notes (Signed)
Anesthesia Regional Block: Interscalene brachial plexus block   Pre-Anesthetic Checklist: , timeout performed,  Correct Patient, Correct Site, Correct Laterality,  Correct Procedure, Correct Position, site marked,  Risks and benefits discussed,  Surgical consent,  Pre-op evaluation,  At surgeon's request and post-op pain management  Laterality: Right  Prep: chloraprep       Needles:  Injection technique: Single-shot  Needle Type: Echogenic Stimulator Needle     Needle Length: 10cm  Needle Gauge: 20     Additional Needles:   Procedures:,,,, ultrasound used (permanent image in chart),,    Narrative:  Start time: 05/21/2021 7:00 AM End time: 05/21/2021 7:05 AM Injection made incrementally with aspirations every 5 mL.  Performed by: Personally  Anesthesiologist: Merlinda Frederick, MD  Additional Notes: Standard monitors applied. Skin prepped. Good needle visualization with ultrasound. Injection made in 5cc increments with no resistance to injection. Patient tolerated the procedure well.

## 2021-05-21 NOTE — Addendum Note (Signed)
Addendum  created 05/21/21 1112 by Willa Frater, CRNA   Charge Capture section accepted

## 2021-05-21 NOTE — Anesthesia Procedure Notes (Addendum)
Procedure Name: Intubation Date/Time: 05/21/2021 7:39 AM Performed by: Willa Frater, CRNA Pre-anesthesia Checklist: Patient identified, Emergency Drugs available, Suction available and Patient being monitored Patient Re-evaluated:Patient Re-evaluated prior to induction Oxygen Delivery Method: Circle system utilized Preoxygenation: Pre-oxygenation with 100% oxygen Induction Type: IV induction Ventilation: Mask ventilation without difficulty Laryngoscope Size: Glidescope, Mac and 3 Grade View: Grade I Tube type: Oral Number of attempts: 1 Airway Equipment and Method: Stylet and Oral airway Placement Confirmation: ETT inserted through vocal cords under direct vision, positive ETCO2 and breath sounds checked- equal and bilateral Secured at: 22 cm Tube secured with: Tape Dental Injury: Teeth and Oropharynx as per pre-operative assessment  Difficulty Due To: Difficulty was anticipated

## 2021-05-24 ENCOUNTER — Encounter (HOSPITAL_BASED_OUTPATIENT_CLINIC_OR_DEPARTMENT_OTHER): Payer: Self-pay | Admitting: Orthopedic Surgery

## 2021-05-28 ENCOUNTER — Encounter: Payer: Self-pay | Admitting: Orthopedic Surgery

## 2021-05-28 ENCOUNTER — Other Ambulatory Visit (HOSPITAL_COMMUNITY): Payer: Self-pay

## 2021-05-28 ENCOUNTER — Ambulatory Visit (INDEPENDENT_AMBULATORY_CARE_PROVIDER_SITE_OTHER): Payer: No Typology Code available for payment source | Admitting: Orthopedic Surgery

## 2021-05-28 ENCOUNTER — Other Ambulatory Visit: Payer: Self-pay

## 2021-05-28 DIAGNOSIS — M75121 Complete rotator cuff tear or rupture of right shoulder, not specified as traumatic: Secondary | ICD-10-CM

## 2021-05-28 NOTE — Progress Notes (Signed)
   Post-Op Visit Note   Patient: Heather Stout           Date of Birth: August 21, 1956           MRN: [id number] Visit Date: 05/28/2021 PCP: Deland Pretty, MD   Assessment & Plan:  Chief Complaint:  Chief Complaint  Patient presents with   Right Shoulder - Routine Post Op     05/21/21 (7d)  Right Shoulder Arthroscopy With Open Rotator Cuff Repair - Right     Visit Diagnoses:  1. Nontraumatic complete tear of right rotator cuff     Plan: Heather Stout is now a week out right shoulder redo rotator cuff tear repair.  On exam incisions are intact.  Deltoid fires nicely.  Range of motion feels smooth.  Plan is to stay in sling for another 3 weeks.  Continue with the black brace 1 hour 3 times a day.  Come back for clinical recheck in sling removal in 3 weeks.  Follow-Up Instructions: Return in about 3 weeks (around 06/18/2021).   Orders:  No orders of the defined types were placed in this encounter.  No orders of the defined types were placed in this encounter.   Imaging: No results found.  PMFS History: Patient Active Problem List   Diagnosis Date Noted   Protrusion of lumbar intervertebral disc 10/25/2020   Essential hypertension 04/04/2015   Hyperlipemia 04/04/2015   Irritable bowel syndrome 04/04/2015   Diabetes ([place]) 11/29/2014   Past Medical History:  Diagnosis Date   Asthma    Essential hypertension, benign    GERD (gastroesophageal reflux disease)    IBS (irritable bowel syndrome)    Left anterior hemiblock    Other and unspecified hyperlipidemia    Sleep apnea    cpap   Type II or unspecified type diabetes mellitus without mention of complication, not stated as uncontrolled     Family History  Problem Relation Age of Onset   Hypertension Father    Hypertension Brother    Diabetes Brother    Hypertension Brother    Crohn's disease Brother    Hypertension Sister    Diabetes Sister     Past Surgical History:  Procedure Laterality Date   FUNCTIONAL  ENDOSCOPIC SINUS SURGERY  1990   LUMBAR LAMINECTOMY  2002   LUMBAR LAMINECTOMY  2005   SHOULDER ARTHROSCOPY WITH OPEN ROTATOR CUFF REPAIR Right 05/15/2020   Procedure: right shoulder arthroscopy, debridement, mini open rotator cuff tear repair, biceps tenodesis;  Surgeon: Meredith Pel, MD;  Location: [place];  Service: Orthopedics;  Laterality: Right;   SHOULDER ARTHROSCOPY WITH OPEN ROTATOR CUFF REPAIR Right 05/21/2021   Procedure: RIGHT SHOULDER ARTHROSCOPY WITH OPEN ROTATOR CUFF REPAIR;  Surgeon: Meredith Pel, MD;  Location: [place];  Service: Orthopedics;  Laterality: Right;   TONSILLECTOMY  1980   Social History   Occupational History   Occupation: Insurance  Tobacco Use   Smoking status: Never   Smokeless tobacco: Never  Substance and Sexual Activity   Alcohol use: No    Alcohol/week: 0.0 standard drinks   Drug use: No   Sexual activity: Not on file

## 2021-05-30 ENCOUNTER — Other Ambulatory Visit (HOSPITAL_COMMUNITY): Payer: Self-pay

## 2021-05-30 MED FILL — Azilsartan Medoxomil-Chlorthalidone Tab 40-25 MG: ORAL | 30 days supply | Qty: 30 | Fill #6 | Status: CN

## 2021-06-05 ENCOUNTER — Other Ambulatory Visit (HOSPITAL_COMMUNITY): Payer: Self-pay

## 2021-06-05 MED FILL — Azilsartan Medoxomil-Chlorthalidone Tab 40-25 MG: ORAL | 30 days supply | Qty: 30 | Fill #6 | Status: AC

## 2021-06-06 ENCOUNTER — Other Ambulatory Visit (HOSPITAL_COMMUNITY): Payer: Self-pay

## 2021-06-09 DIAGNOSIS — S46011A Strain of muscle(s) and tendon(s) of the rotator cuff of right shoulder, initial encounter: Secondary | ICD-10-CM

## 2021-06-11 ENCOUNTER — Other Ambulatory Visit (HOSPITAL_COMMUNITY): Payer: Self-pay

## 2021-06-18 ENCOUNTER — Other Ambulatory Visit: Payer: Self-pay

## 2021-06-18 ENCOUNTER — Ambulatory Visit (INDEPENDENT_AMBULATORY_CARE_PROVIDER_SITE_OTHER): Payer: No Typology Code available for payment source | Admitting: Orthopedic Surgery

## 2021-06-18 DIAGNOSIS — M75121 Complete rotator cuff tear or rupture of right shoulder, not specified as traumatic: Secondary | ICD-10-CM

## 2021-06-21 ENCOUNTER — Other Ambulatory Visit (HOSPITAL_COMMUNITY): Payer: Self-pay

## 2021-06-22 ENCOUNTER — Other Ambulatory Visit (HOSPITAL_COMMUNITY): Payer: Self-pay

## 2021-06-22 ENCOUNTER — Other Ambulatory Visit: Payer: Self-pay | Admitting: Orthopedic Surgery

## 2021-06-22 ENCOUNTER — Encounter: Payer: Self-pay | Admitting: Orthopedic Surgery

## 2021-06-22 NOTE — Progress Notes (Signed)
Post-Op Visit Note   Patient: Heather Stout           Date of Birth: 1956-08-22           MRN: [id number] Visit Date: 06/18/2021 PCP: Deland Pretty, MD   Assessment & Plan:  Chief Complaint:  Chief Complaint  Patient presents with   Post-op Follow-up    Right Shoulder - Routine Post Op                  05/21/21              Right Shoulder Arthroscopy With Open Rotator Cuff Repair - Right     Visit Diagnoses:  1. Nontraumatic complete tear of right rotator cuff     Plan: Heather Stout is a 65 year old patient who underwent revision rotator cuff tear repair 05/21/2021.  She has some achiness when she wakes up but overall she is improving.  On exam passive range of motion is very smooth.  Strength is improving.  She is sleeping in her bed already.  Plan is to start physical therapy here in 2 weeks.  4-week return.  Takes a muscle relaxer for pain.  I want her to start therapy in 2 weeks 2 times a week for 4 weeks for passive range of motion and active assisted range of motion only for the initial part of her therapy.  Overall her passive range of motion looks really good.  I do want to be careful with this revision scenario cuff repair.  Follow-Up Instructions: Return in about 4 weeks (around 07/16/2021).   Orders:  Orders Placed This Encounter  Procedures   Ambulatory referral to Physical Therapy   No orders of the defined types were placed in this encounter.   Imaging: No results found.  PMFS History: Patient Active Problem List   Diagnosis Date Noted   Traumatic complete tear of right rotator cuff    Protrusion of lumbar intervertebral disc 10/25/2020   Essential hypertension 04/04/2015   Hyperlipemia 04/04/2015   Irritable bowel syndrome 04/04/2015   Diabetes ([place]) 11/29/2014   Past Medical History:  Diagnosis Date   Asthma    Essential hypertension, benign    GERD (gastroesophageal reflux disease)    IBS (irritable bowel syndrome)    Left anterior hemiblock     Other and unspecified hyperlipidemia    Sleep apnea    cpap   Type II or unspecified type diabetes mellitus without mention of complication, not stated as uncontrolled     Family History  Problem Relation Age of Onset   Hypertension Father    Hypertension Brother    Diabetes Brother    Hypertension Brother    Crohn's disease Brother    Hypertension Sister    Diabetes Sister     Past Surgical History:  Procedure Laterality Date   FUNCTIONAL ENDOSCOPIC SINUS SURGERY  1990   LUMBAR LAMINECTOMY  2002   LUMBAR LAMINECTOMY  2005   SHOULDER ARTHROSCOPY WITH OPEN ROTATOR CUFF REPAIR Right 05/15/2020   Procedure: right shoulder arthroscopy, debridement, mini open rotator cuff tear repair, biceps tenodesis;  Surgeon: Meredith Pel, MD;  Location: [place];  Service: Orthopedics;  Laterality: Right;   SHOULDER ARTHROSCOPY WITH OPEN ROTATOR CUFF REPAIR Right 05/21/2021   Procedure: RIGHT SHOULDER ARTHROSCOPY WITH OPEN ROTATOR CUFF REPAIR;  Surgeon: Meredith Pel, MD;  Location: [place];  Service: Orthopedics;  Laterality: Right;   TONSILLECTOMY  1980   Social History  Occupational History   Occupation: Insurance  Tobacco Use   Smoking status: Never   Smokeless tobacco: Never  Substance and Sexual Activity   Alcohol use: No    Alcohol/week: 0.0 standard drinks   Drug use: No   Sexual activity: Not on file

## 2021-06-25 ENCOUNTER — Other Ambulatory Visit (HOSPITAL_COMMUNITY): Payer: Self-pay

## 2021-06-26 ENCOUNTER — Other Ambulatory Visit (HOSPITAL_COMMUNITY): Payer: Self-pay

## 2021-06-26 ENCOUNTER — Telehealth: Payer: Self-pay | Admitting: Orthopedic Surgery

## 2021-06-26 MED ORDER — METHOCARBAMOL 500 MG PO TABS
500.0000 mg | ORAL_TABLET | Freq: Four times a day (QID) | ORAL | 0 refills | Status: DC
  Filled 2021-06-26: qty 30, 8d supply, fill #0

## 2021-06-26 NOTE — Telephone Encounter (Signed)
Update [employer] form RTW 07/16/21 faxed [phone]

## 2021-06-28 ENCOUNTER — Other Ambulatory Visit (HOSPITAL_COMMUNITY): Payer: Self-pay

## 2021-07-02 ENCOUNTER — Other Ambulatory Visit: Payer: Self-pay

## 2021-07-02 ENCOUNTER — Ambulatory Visit (INDEPENDENT_AMBULATORY_CARE_PROVIDER_SITE_OTHER): Payer: No Typology Code available for payment source | Admitting: Physical Therapy

## 2021-07-02 ENCOUNTER — Encounter: Payer: Self-pay | Admitting: Physical Therapy

## 2021-07-02 DIAGNOSIS — M25611 Stiffness of right shoulder, not elsewhere classified: Secondary | ICD-10-CM | POA: Diagnosis not present

## 2021-07-02 DIAGNOSIS — R600 Localized edema: Secondary | ICD-10-CM | POA: Diagnosis not present

## 2021-07-02 DIAGNOSIS — M25511 Pain in right shoulder: Secondary | ICD-10-CM

## 2021-07-02 DIAGNOSIS — M6281 Muscle weakness (generalized): Secondary | ICD-10-CM | POA: Diagnosis not present

## 2021-07-02 DIAGNOSIS — G8929 Other chronic pain: Secondary | ICD-10-CM

## 2021-07-02 NOTE — Therapy (Signed)
Heather Stout, [place], [address] Phone: [phone]   Fax:  [phone]  Physical Therapy Evaluation  Patient Details  Name: Heather Stout MRN: [id number] Date of Birth: [birth year] Referring Provider (PT): Heather Pel, MD   Encounter Date: 07/02/2021   PT End of Session - 07/02/21 1503     Visit Number 1    Number of Visits 12    Date for PT Re-Evaluation 08/27/21    Authorization Type [facility]    PT Start Time 1435    PT Stop Time 1503    PT Time Calculation (min) 28 min    Activity Tolerance Patient tolerated treatment well    Behavior During Therapy WFL for tasks assessed/performed             Past Medical History:  Diagnosis Date   Asthma    Essential hypertension, benign    GERD (gastroesophageal reflux disease)    IBS (irritable bowel syndrome)    Left anterior hemiblock    Other and unspecified hyperlipidemia    Sleep apnea    cpap   Type II or unspecified type diabetes mellitus without mention of complication, not stated as uncontrolled     Past Surgical History:  Procedure Laterality Date   FUNCTIONAL ENDOSCOPIC SINUS SURGERY  1990   LUMBAR LAMINECTOMY  2002   LUMBAR LAMINECTOMY  2005   SHOULDER ARTHROSCOPY WITH OPEN ROTATOR CUFF REPAIR Right 05/15/2020   Procedure: right shoulder arthroscopy, debridement, mini open rotator cuff tear repair, biceps tenodesis;  Surgeon: Heather Pel, MD;  Location: [place];  Service: Orthopedics;  Laterality: Right;   SHOULDER ARTHROSCOPY WITH OPEN ROTATOR CUFF REPAIR Right 05/21/2021   Procedure: RIGHT SHOULDER ARTHROSCOPY WITH OPEN ROTATOR CUFF REPAIR;  Surgeon: Heather Pel, MD;  Location: [facility];  Service: Orthopedics;  Laterality: Right;   TONSILLECTOMY  1980    There were no vitals filed for this visit.    Subjective Assessment - 07/02/21 1437     Subjective Pt is a 65 y/o female who presents  to OPPT for revision of Rt RTC repair on 05/21/21.  She has restrictions for PROM and AAROM only.    Patient Stated Goals regain use of RUE    Currently in Pain? Yes    Pain Score 3     Pain Location Shoulder    Pain Orientation Right    Pain Descriptors / Indicators Aching;Tiring    Pain Type Acute pain;Surgical pain    Pain Onset More than a month ago    Pain Frequency Intermittent    Aggravating Factors  increased activity in general    Pain Relieving Factors rest, tylenol, muscle relaxers (PRN at night)                [facility] PT Assessment - 07/02/21 1440       Assessment   Medical Diagnosis M75.121 (ICD-10-CM) - Nontraumatic complete tear of right rotator cuff    Referring Provider (PT) Heather Pel, MD    Onset Date/Surgical Date 05/21/21    Hand Dominance Right    Next MD Visit 07/16/21    Prior Therapy at this clinic after initial surgery (2021)      Balance Screen   Has the patient fallen in the past 6 months No    Has the patient had a decrease in activity level because of a fear of falling?  No    Is the patient  reluctant to leave their home because of a fear of falling?  No      Home Environment   Living Environment Private residence    Living Arrangements Children;Parent    Additional Comments has to do all yardwork and household work      Prior Function   Level of Independence Independent    Vocation Full time employment    Vocation Requirements seated computer work    Leisure hasn't been overly active lately      Cognition   Overall Cognitive Status Within Functional Limits for tasks assessed      Observation/Other Assessments   Focus on Therapeutic Outcomes (FOTO)  50 (predicted 61)      Posture/Postural Control   Posture/Postural Control Postural limitations    Postural Limitations Rounded Shoulders;Forward head      ROM / Strength   AROM / PROM / Strength PROM;Strength      PROM   PROM Assessment Site Shoulder    Right/Left Shoulder  Right    Right Shoulder Flexion 154 Degrees    Right Shoulder ABduction 178 Degrees    Right Shoulder Internal Rotation 90 Degrees   70 deg abdct   Right Shoulder External Rotation 80 Degrees   45 deg abdct     Strength   Overall Strength Comments deferred at this time due to post op status                        Objective measurements completed on examination: See above findings.       [facility] Adult PT Treatment/Exercise - 07/02/21 1440       Exercises   Exercises Other Exercises    Other Exercises  see pt instructions  - Reviewed PRN for understanding                     PT Education - 07/02/21 1503     Education Details HEP    Person(s) Educated Patient    Methods Explanation;Demonstration;Handout    Comprehension Verbalized understanding;Returned demonstration;Need further instruction              PT Short Term Goals - 07/02/21 1541       PT SHORT TERM GOAL #1   Title independent with initial HEP    Time 4    Period Weeks    Status New    Target Date 07/30/21      PT SHORT TERM GOAL #2   Title improve Rt shoulder PROM to WNL for improved function    Time 4    Period Weeks    Status New    Target Date 07/30/21               PT Long Term Goals - 07/02/21 1542       PT LONG TERM GOAL #1   Title Heather Stout will have an improved FOTO score to at least 61    Baseline -    Time 8    Period Weeks    Status New    Target Date 08/27/21      PT LONG TERM GOAL #2   Title Improve R shoulder AROM for flexion to 160 degrees; ER to 90; IR to 90 and abduction to at least 160 deg for improved motion    Baseline -    Time 8    Period Weeks    Status New    Target Date 08/27/21  PT LONG TERM GOAL #3   Title Improve R shoulder strength to 4-/5 MMT    Baseline -    Time 8    Period Weeks    Status New    Target Date 08/27/21      PT LONG TERM GOAL #4   Title Heather Stout will be independent with her long-term HEP at DC.     Time 8    Period Weeks    Status New    Target Date 08/27/21      PT LONG TERM GOAL #5   Title report no pain with activity for improved function    Time 8    Period Weeks    Status New    Target Date 08/27/21                    Plan - 07/02/21 1503     Clinical Impression Statement Pt is a 65 y/o female who presents to OPPT s/p revision of Rt RTC repair on 05/21/21.  She demonstrates PROM which is near full at this time, and therefore issued HEP to address PROM and AAROM exercises to work on at home.  Will plan to see only 1x/wk due to excellent progress until able to progress protocol.  Will continue to benefit from PT to address defictis listed.    Personal Factors and Comorbidities Comorbidity 3+    Comorbidities asthma, HTN, OSA, DM, Rt RTC repair 2021, lumbar laminectomy    Examination-Activity Limitations Lift;Hygiene/Grooming;Dressing;Carry;Reach Overhead;Bathing;Bed Mobility    Examination-Participation Restrictions Cleaning;Meal Prep;Yard Work;Community Activity;Occupation;Driving;Shop;Laundry    Stability/Clinical Decision Making Evolving/Moderate complexity    Clinical Decision Making Moderate    Rehab Potential Excellent    PT Frequency 2x / week   1x/wk x 2 wks, then 2x/wk x 6 wks   PT Duration 8 weeks    PT Treatment/Interventions ADLs/Self Care Home Management;Cryotherapy;Electrical Stimulation;Moist Heat;Functional mobility training;Therapeutic activities;Therapeutic exercise;Neuromuscular re-education;Patient/family education;Manual techniques;Vasopneumatic Device;Taping;Dry needling;Passive range of motion    PT Next Visit Plan review HEP and progress as able within protocol limitations    PT Home Exercise Plan Access Code: [email or web address]    Consulted and Agree with Plan of Care Patient             Patient will benefit from skilled therapeutic intervention in order to improve the following deficits and impairments:  Pain, Postural dysfunction, Decreased  range of motion, Decreased strength, Impaired UE functional use  Visit Diagnosis: Muscle weakness (generalized) - Plan: PT plan of care cert/re-cert  Chronic right shoulder pain - Plan: PT plan of care cert/re-cert  Stiffness of right shoulder, not elsewhere classified - Plan: PT plan of care cert/re-cert  Localized edema - Plan: PT plan of care cert/re-cert     Problem List Patient Active Problem List   Diagnosis Date Noted   Traumatic complete tear of right rotator cuff    Protrusion of lumbar intervertebral disc 10/25/2020   Essential hypertension 04/04/2015   Hyperlipemia 04/04/2015   Irritable bowel syndrome 04/04/2015   Diabetes ([place]) 11/29/2014      Laureen Abrahams, PT, DPT 07/02/21 3:47 PM     [facility] [address] [place], [place], [address] Phone: [phone]   Fax:  [phone]  Name: Heather Stout MRN: [id number] Date of Birth: 1956-08-11

## 2021-07-02 NOTE — Patient Instructions (Signed)
Access Code: [email or web address] URL: [email or web address] Date: 07/02/2021 Prepared by: Faustino Congress  Exercises Supine Shoulder Flexion with Dowel - 2-3 x daily - 7 x weekly - 1 sets - 10-15 reps - 3-5 sec hold Supine Shoulder External Rotation AAROM with Dowel - 2-3 x daily - 7 x weekly - 1 sets - 10-15 reps Seated Shoulder Flexion AAROM with Pulley Behind - 1-2 x daily - 7 x weekly - 1 sets - 1 reps - 3 min hold Seated Shoulder Scaption AAROM with Pulley at Side - 1-2 x daily - 7 x weekly - 1 sets - 1 reps - 3 min Seated Scapular Retraction - 2 x daily - 7 x weekly - 1 sets - 10 reps - 5 sec hold

## 2021-07-05 ENCOUNTER — Encounter: Payer: No Typology Code available for payment source | Admitting: Physical Therapy

## 2021-07-09 ENCOUNTER — Encounter: Payer: No Typology Code available for payment source | Admitting: Physical Therapy

## 2021-07-10 ENCOUNTER — Other Ambulatory Visit (HOSPITAL_COMMUNITY): Payer: Self-pay

## 2021-07-10 MED FILL — Azilsartan Medoxomil-Chlorthalidone Tab 40-25 MG: ORAL | 30 days supply | Qty: 30 | Fill #7 | Status: AC

## 2021-07-11 ENCOUNTER — Ambulatory Visit (INDEPENDENT_AMBULATORY_CARE_PROVIDER_SITE_OTHER): Payer: No Typology Code available for payment source | Admitting: Physical Therapy

## 2021-07-11 ENCOUNTER — Other Ambulatory Visit: Payer: Self-pay

## 2021-07-11 ENCOUNTER — Encounter: Payer: Self-pay | Admitting: Physical Therapy

## 2021-07-11 DIAGNOSIS — M6281 Muscle weakness (generalized): Secondary | ICD-10-CM | POA: Diagnosis not present

## 2021-07-11 DIAGNOSIS — R600 Localized edema: Secondary | ICD-10-CM | POA: Diagnosis not present

## 2021-07-11 DIAGNOSIS — M25511 Pain in right shoulder: Secondary | ICD-10-CM

## 2021-07-11 DIAGNOSIS — M25611 Stiffness of right shoulder, not elsewhere classified: Secondary | ICD-10-CM | POA: Diagnosis not present

## 2021-07-11 DIAGNOSIS — G8929 Other chronic pain: Secondary | ICD-10-CM

## 2021-07-11 NOTE — Therapy (Signed)
[place], [place], [address] Phone: [phone]   Fax:  [phone]  Physical Therapy Treatment  Patient Details  Name: Heather Stout MRN: [id number] Date of Birth: 20-Aug-1956 Referring Provider (PT): Meredith Pel, MD   Encounter Date: 07/11/2021   PT End of Session - 07/11/21 1408     Visit Number 2    Number of Visits 12    Date for PT Re-Evaluation 08/27/21    Authorization Type [facility]    PT Start Time 1340    PT Stop Time 1884    PT Time Calculation (min) 28 min    Activity Tolerance Patient tolerated treatment well    Behavior During Therapy WFL for tasks assessed/performed             Past Medical History:  Diagnosis Date   Asthma    Essential hypertension, benign    GERD (gastroesophageal reflux disease)    IBS (irritable bowel syndrome)    Left anterior hemiblock    Other and unspecified hyperlipidemia    Sleep apnea    cpap   Type II or unspecified type diabetes mellitus without mention of complication, not stated as uncontrolled     Past Surgical History:  Procedure Laterality Date   FUNCTIONAL ENDOSCOPIC SINUS SURGERY  1990   LUMBAR LAMINECTOMY  2002   LUMBAR LAMINECTOMY  2005   SHOULDER ARTHROSCOPY WITH OPEN ROTATOR CUFF REPAIR Right 05/15/2020   Procedure: right shoulder arthroscopy, debridement, mini open rotator cuff tear repair, biceps tenodesis;  Surgeon: Meredith Pel, MD;  Location: [place];  Service: Orthopedics;  Laterality: Right;   SHOULDER ARTHROSCOPY WITH OPEN ROTATOR CUFF REPAIR Right 05/21/2021   Procedure: RIGHT SHOULDER ARTHROSCOPY WITH OPEN ROTATOR CUFF REPAIR;  Surgeon: Meredith Pel, MD;  Location: [place];  Service: Orthopedics;  Laterality: Right;   TONSILLECTOMY  1980    There were no vitals filed for this visit.   Subjective Assessment - 07/11/21 1339     Subjective shoulder is doing well    Patient  Stated Goals regain use of RUE    Currently in Pain? Yes    Pain Score 1                 [facility] PT Assessment - 07/11/21 1349       Assessment   Medical Diagnosis M75.121 (ICD-10-CM) - Nontraumatic complete tear of right rotator cuff    Referring Provider (PT) Meredith Pel, MD      PROM   Overall PROM Comments Rt shoulder PROM full    Right Shoulder Flexion 156 Degrees    Right Shoulder ABduction 178 Degrees    Right Shoulder Internal Rotation 90 Degrees    Right Shoulder External Rotation 90 Degrees                           [facility] Adult PT Treatment/Exercise - 07/11/21 1350       Exercises   Exercises Shoulder    Other Exercises  discussed progression to AROM exercises should she be cleared at next MD appt      Shoulder Exercises: Seated   Retraction 10 reps      Shoulder Exercises: Standing   Flexion AAROM;10 reps   1# bar   ABduction AAROM;10 reps;Right   1# bar   Other Standing Exercises flexion/scaption x 5 reps for wall walk on Rt  PT Short Term Goals - 07/11/21 1408       PT SHORT TERM GOAL #1   Title independent with initial HEP    Time 4    Period Weeks    Status Achieved    Target Date 07/30/21      PT SHORT TERM GOAL #2   Title improve Rt shoulder PROM to WNL for improved function    Time 4    Period Weeks    Status Achieved    Target Date 07/30/21               PT Long Term Goals - 07/11/21 1408       PT LONG TERM GOAL #1   Title Heather Stout will have an improved FOTO score to at least 61    Baseline -    Time 8    Period Weeks    Status On-going    Target Date 08/27/20      PT LONG TERM GOAL #2   Title Improve R shoulder AROM for flexion to 160 degrees; ER to 90; IR to 90 and abduction to at least 160 deg for improved motion    Baseline -    Time 8    Period Weeks    Status On-going      PT LONG TERM GOAL #3   Title Improve R shoulder strength to 4-/5 MMT     Baseline -    Time 8    Period Weeks    Status On-going      PT LONG TERM GOAL #4   Title Heather Stout will be independent with her long-term HEP at [place].    Time 8    Period Weeks    Status On-going      PT LONG TERM GOAL #5   Title report no pain with activity for improved function    Time 8    Period Weeks    Status On-going                   Plan - 07/11/21 1409     Clinical Impression Statement Pt has met both STGs and demonstrates excellent knowledge of current HEP.  She is at the max of her protocol limitations at this time, but will follow up with Dr. Marlou Sa next week.  Overall doing well with PT and will continue to benefit from PT to maximize function.    Personal Factors and Comorbidities Comorbidity 3+    Comorbidities asthma, HTN, OSA, DM, Rt RTC repair 2021, lumbar laminectomy    Examination-Activity Limitations Lift;Hygiene/Grooming;Dressing;Carry;Reach Overhead;Bathing;Bed Mobility    Examination-Participation Restrictions Cleaning;Meal Prep;Yard Work;Community Activity;Occupation;Driving;Shop;Laundry    Stability/Clinical Decision Making Evolving/Moderate complexity    Rehab Potential Excellent    PT Frequency 2x / week   1x/wk x 2 wks, then 2x/wk x 6 wks   PT Duration 8 weeks    PT Treatment/Interventions ADLs/Self Care Home Management;Cryotherapy;Electrical Stimulation;Moist Heat;Functional mobility training;Therapeutic activities;Therapeutic exercise;Neuromuscular re-education;Patient/family education;Manual techniques;Vasopneumatic Device;Taping;Dry needling;Passive range of motion    PT Next Visit Plan continue per MD recommendations    PT Home Exercise Plan Access Code: [email or web address]    Consulted and Agree with Plan of Care Patient             Patient will benefit from skilled therapeutic intervention in order to improve the following deficits and impairments:  Pain, Postural dysfunction, Decreased range of motion, Decreased strength, Impaired UE functional  use  Visit Diagnosis: Muscle weakness (generalized)  Chronic right shoulder  pain  Stiffness of right shoulder, not elsewhere classified  Localized edema     Problem List Patient Active Problem List   Diagnosis Date Noted   Traumatic complete tear of right rotator cuff    Protrusion of lumbar intervertebral disc 10/25/2020   Essential hypertension 04/04/2015   Hyperlipemia 04/04/2015   Irritable bowel syndrome 04/04/2015   Diabetes ([place]) 11/29/2014     Heather Stout, PT, DPT 07/11/21 2:11 PM    [facility] [address] [place], [place], [address] Phone: [phone]   Fax:  [phone]  Name: Heather Stout MRN: [id number] Date of Birth: 1955-11-23

## 2021-07-12 ENCOUNTER — Encounter: Payer: No Typology Code available for payment source | Admitting: Physical Therapy

## 2021-07-16 ENCOUNTER — Other Ambulatory Visit: Payer: Self-pay

## 2021-07-16 ENCOUNTER — Ambulatory Visit (INDEPENDENT_AMBULATORY_CARE_PROVIDER_SITE_OTHER): Payer: No Typology Code available for payment source | Admitting: Orthopedic Surgery

## 2021-07-16 ENCOUNTER — Encounter: Payer: Self-pay | Admitting: Orthopedic Surgery

## 2021-07-16 DIAGNOSIS — M75121 Complete rotator cuff tear or rupture of right shoulder, not specified as traumatic: Secondary | ICD-10-CM

## 2021-07-17 ENCOUNTER — Other Ambulatory Visit (HOSPITAL_COMMUNITY): Payer: Self-pay

## 2021-07-17 ENCOUNTER — Encounter: Payer: Self-pay | Admitting: Orthopedic Surgery

## 2021-07-17 NOTE — Progress Notes (Signed)
Post-Op Visit Note   Patient: Heather Stout           Date of Birth: 09-05-1955           MRN: [id number] Visit Date: 07/16/2021 PCP: Heather Pretty, MD   Assessment & Plan:  Chief Complaint:  Chief Complaint  Patient presents with   Right Shoulder - Post-op Follow-up   Visit Diagnoses:  1. Nontraumatic complete tear of right rotator cuff     Plan: [name] is a patient who underwent right shoulder rotator cuff tear revision.  She has a very pulley.  She has not started any strengthening yet.  She is sleeping in her bed.  Diabetes is well controlled.  She has been doing a home exercise program focusing primarily on range of motion.  On examination she has seen was range of motion and excellent external rotation strength.  Passive range of motion currently is 50/90/16 65.  Overall in that shoulder looks excellent nearly 6 weeks out from revision rotator cuff repair.  I want her to start gentle strengthening when she starts therapy on the fifth.  It would be nice to get a little bit more maturity of healing at that tendon interface with the bone.  Cautioned her against heavy lifting in front of her body.  Come back in early January for clinical recheck  Follow-Up Instructions: No follow-ups on file.   Orders:  No orders of the defined types were placed in this encounter.  No orders of the defined types were placed in this encounter.   Imaging: No results found.  PMFS History: Patient Active Problem List   Diagnosis Date Noted   Traumatic complete tear of right rotator cuff    Protrusion of lumbar intervertebral disc 10/25/2020   Essential hypertension 04/04/2015   Hyperlipemia 04/04/2015   Irritable bowel syndrome 04/04/2015   Diabetes ([place]) 11/29/2014   Past Medical History:  Diagnosis Date   Asthma    Essential hypertension, benign    GERD (gastroesophageal reflux disease)    IBS (irritable bowel syndrome)    Left anterior hemiblock    Other and unspecified  hyperlipidemia    Sleep apnea    cpap   Type II or unspecified type diabetes mellitus without mention of complication, not stated as uncontrolled     Family History  Problem Relation Age of Onset   Hypertension Father    Hypertension Brother    Diabetes Brother    Hypertension Brother    Crohn's disease Brother    Hypertension Sister    Diabetes Sister     Past Surgical History:  Procedure Laterality Date   FUNCTIONAL ENDOSCOPIC SINUS SURGERY  1990   LUMBAR LAMINECTOMY  2002   LUMBAR LAMINECTOMY  2005   SHOULDER ARTHROSCOPY WITH OPEN ROTATOR CUFF REPAIR Right 05/15/2020   Procedure: right shoulder arthroscopy, debridement, mini open rotator cuff tear repair, biceps tenodesis;  Surgeon: Meredith Pel, MD;  Location: [facility];  Service: Orthopedics;  Laterality: Right;   SHOULDER ARTHROSCOPY WITH OPEN ROTATOR CUFF REPAIR Right 05/21/2021   Procedure: RIGHT SHOULDER ARTHROSCOPY WITH OPEN ROTATOR CUFF REPAIR;  Surgeon: Meredith Pel, MD;  Location: [facility];  Service: Orthopedics;  Laterality: Right;   TONSILLECTOMY  1980   Social History   Occupational History   Occupation: Insurance  Tobacco Use   Smoking status: Never   Smokeless tobacco: Never  Substance and Sexual Activity   Alcohol use: No    Alcohol/week: 0.0  standard drinks   Drug use: No   Sexual activity: Not on file

## 2021-07-18 ENCOUNTER — Other Ambulatory Visit (HOSPITAL_COMMUNITY): Payer: Self-pay

## 2021-07-30 ENCOUNTER — Other Ambulatory Visit (HOSPITAL_COMMUNITY): Payer: Self-pay

## 2021-07-31 ENCOUNTER — Other Ambulatory Visit (HOSPITAL_COMMUNITY): Payer: Self-pay

## 2021-08-01 ENCOUNTER — Other Ambulatory Visit: Payer: Self-pay

## 2021-08-01 ENCOUNTER — Ambulatory Visit (INDEPENDENT_AMBULATORY_CARE_PROVIDER_SITE_OTHER): Payer: No Typology Code available for payment source | Admitting: Physical Therapy

## 2021-08-01 ENCOUNTER — Encounter: Payer: Self-pay | Admitting: Physical Therapy

## 2021-08-01 DIAGNOSIS — M25611 Stiffness of right shoulder, not elsewhere classified: Secondary | ICD-10-CM | POA: Diagnosis not present

## 2021-08-01 DIAGNOSIS — R600 Localized edema: Secondary | ICD-10-CM | POA: Diagnosis not present

## 2021-08-01 DIAGNOSIS — M25511 Pain in right shoulder: Secondary | ICD-10-CM

## 2021-08-01 DIAGNOSIS — G8929 Other chronic pain: Secondary | ICD-10-CM

## 2021-08-01 DIAGNOSIS — M6281 Muscle weakness (generalized): Secondary | ICD-10-CM | POA: Diagnosis not present

## 2021-08-01 NOTE — Therapy (Signed)
[facility] [place], [place], [address] Phone: [phone]   Fax:  [phone]  Physical Therapy Treatment  Patient Details  Name: Heather Stout MRN: [id number] Date of Birth: Sep 12, 1955 Referring Provider (PT): Heather Pel, MD   Encounter Date: 08/01/2021   PT End of Session - 08/01/21 1618     Visit Number 3    Number of Visits 12    Date for PT Re-Evaluation 08/27/21    Authorization Type [facility]    PT Start Time 1555    PT Stop Time 1618    PT Time Calculation (min) 23 min    Activity Tolerance Patient tolerated treatment well    Behavior During Therapy WFL for tasks assessed/performed             Past Medical History:  Diagnosis Date   Asthma    Essential hypertension, benign    GERD (gastroesophageal reflux disease)    IBS (irritable bowel syndrome)    Left anterior hemiblock    Other and unspecified hyperlipidemia    Sleep apnea    cpap   Type II or unspecified type diabetes mellitus without mention of complication, not stated as uncontrolled     Past Surgical History:  Procedure Laterality Date   FUNCTIONAL ENDOSCOPIC SINUS SURGERY  1990   LUMBAR LAMINECTOMY  2002   LUMBAR LAMINECTOMY  2005   SHOULDER ARTHROSCOPY WITH OPEN ROTATOR CUFF REPAIR Right 05/15/2020   Procedure: right shoulder arthroscopy, debridement, mini open rotator cuff tear repair, biceps tenodesis;  Surgeon: Heather Pel, MD;  Location: [facility];  Service: Orthopedics;  Laterality: Right;   SHOULDER ARTHROSCOPY WITH OPEN ROTATOR CUFF REPAIR Right 05/21/2021   Procedure: RIGHT SHOULDER ARTHROSCOPY WITH OPEN ROTATOR CUFF REPAIR;  Surgeon: Heather Pel, MD;  Location: [facility];  Service: Orthopedics;  Laterality: Right;   TONSILLECTOMY  1980    There were no vitals filed for this visit.   Subjective Assessment - 08/01/21 1602     Subjective shoulder is doing well    Patient  Stated Goals regain use of RUE    Pain Onset More than a month ago              [facility] Adult PT Treatment/Exercise - 08/01/21 0001       Exercises   Other Exercises  discussed progression to gentle strength program to tolerance now cleared by MD      Shoulder Exercises: Standing   External Rotation Right;15 reps    Theraband Level (Shoulder External Rotation) Level 2 (Red)    Internal Rotation Right    Theraband Level (Shoulder Internal Rotation) Level 2 (Red)    Internal Rotation Limitations had pain so stopped after 5 reps    Flexion AAROM;10 reps    Flexion Limitations 1#bar    ABduction Right;10 reps    ABduction Limitations holding 1# bar horizontal in Rt only    Extension Both;15 reps    Theraband Level (Shoulder Extension) Level 2 (Red)    Row Both;15 reps    Theraband Level (Shoulder Row) Level 2 (Red)    Other Standing Exercises wall ladder X 10 flexion and X 10 abd      Shoulder Exercises: ROM/Strengthening   UBE (Upper Arm Bike) L2 X 2 min fwd, 2 min retro                       PT Short Term Goals -  07/11/21 1408       PT SHORT TERM GOAL #1   Title independent with initial HEP    Time 4    Period Weeks    Status Achieved    Target Date 07/30/21      PT SHORT TERM GOAL #2   Title improve Rt shoulder PROM to WNL for improved function    Time 4    Period Weeks    Status Achieved    Target Date 07/30/21               PT Long Term Goals - 07/11/21 1408       PT LONG TERM GOAL #1   Title Heather Stout will have an improved FOTO score to at least 61    Baseline -    Time 8    Period Weeks    Status On-going    Target Date 08/27/20      PT LONG TERM GOAL #2   Title Improve R shoulder AROM for flexion to 160 degrees; ER to 90; IR to 90 and abduction to at least 160 deg for improved motion    Baseline -    Time 8    Period Weeks    Status On-going      PT LONG TERM GOAL #3   Title Improve R shoulder strength to 4-/5 MMT    Baseline  -    Time 8    Period Weeks    Status On-going      PT LONG TERM GOAL #4   Title Heather Stout will be independent with her long-term HEP at DC.    Time 8    Period Weeks    Status On-going      PT LONG TERM GOAL #5   Title report no pain with activity for improved function    Time 8    Period Weeks    Status On-going                   Plan - 08/01/21 1619     Clinical Impression Statement She was cleared to begin light strength program per MD so we began this today to her tolerance. I provided her with red band to take home and we reviewed the exercises to perform. She relays she does not need a print out of this and she deferred PROM today as her ROM is feeling great. We will monitor her soreness and progress slowly to her tolerance.    Personal Factors and Comorbidities Comorbidity 3+    Comorbidities asthma, HTN, OSA, DM, Rt RTC repair 2021, lumbar laminectomy    Examination-Activity Limitations Lift;Hygiene/Grooming;Dressing;Carry;Reach Overhead;Bathing;Bed Mobility    Examination-Participation Restrictions Cleaning;Meal Prep;Yard Work;Community Activity;Occupation;Driving;Shop;Laundry    Stability/Clinical Decision Making Evolving/Moderate complexity    Rehab Potential Excellent    PT Frequency 2x / week   1x/wk x 2 wks, then 2x/wk x 6 wks   PT Duration 8 weeks    PT Treatment/Interventions ADLs/Self Care Home Management;Cryotherapy;Electrical Stimulation;Moist Heat;Functional mobility training;Therapeutic activities;Therapeutic exercise;Neuromuscular re-education;Patient/family education;Manual techniques;Vasopneumatic Device;Taping;Dry needling;Passive range of motion    PT Next Visit Plan light strength program for Rt RTC    PT Home Exercise Plan Access Code: [email or web address], added rows, extensions and ER with red band    Consulted and Agree with Plan of Care Patient             Patient will benefit from skilled therapeutic intervention in order to improve the following  deficits and impairments:  Pain,  Postural dysfunction, Decreased range of motion, Decreased strength, Impaired UE functional use  Visit Diagnosis: Muscle weakness (generalized)  Chronic right shoulder pain  Stiffness of right shoulder, not elsewhere classified  Localized edema     Problem List Patient Active Problem List   Diagnosis Date Noted   Traumatic complete tear of right rotator cuff    Protrusion of lumbar intervertebral disc 10/25/2020   Essential hypertension 04/04/2015   Hyperlipemia 04/04/2015   Irritable bowel syndrome 04/04/2015   Diabetes ([place]) 11/29/2014    Debbe Odea, PT,DPT 08/01/2021, 4:22 PM  [facility] Physical Therapy [address] [place], [place], [address] Phone: [phone]   Fax:  [phone]  Name: Heather Stout MRN: [id number] Date of Birth: 05-10-1956

## 2021-08-06 ENCOUNTER — Other Ambulatory Visit: Payer: Self-pay

## 2021-08-06 ENCOUNTER — Ambulatory Visit (INDEPENDENT_AMBULATORY_CARE_PROVIDER_SITE_OTHER): Payer: No Typology Code available for payment source | Admitting: Physical Therapy

## 2021-08-06 DIAGNOSIS — R600 Localized edema: Secondary | ICD-10-CM

## 2021-08-06 DIAGNOSIS — M6281 Muscle weakness (generalized): Secondary | ICD-10-CM

## 2021-08-06 DIAGNOSIS — M25611 Stiffness of right shoulder, not elsewhere classified: Secondary | ICD-10-CM | POA: Diagnosis not present

## 2021-08-06 DIAGNOSIS — M25511 Pain in right shoulder: Secondary | ICD-10-CM | POA: Diagnosis not present

## 2021-08-06 DIAGNOSIS — G8929 Other chronic pain: Secondary | ICD-10-CM

## 2021-08-06 NOTE — Therapy (Signed)
Heather Stout, [place], [address] Phone: [phone]   Fax:  [phone]  Physical Therapy Treatment  Patient Details  Name: Heather Stout MRN: [id number] Date of Birth: [birth year] Referring Provider (PT): Meredith Pel, MD   Encounter Date: 08/06/2021   PT End of Session - 08/06/21 1632     Visit Number 4    Number of Visits 12    Date for PT Re-Evaluation 08/27/21    Authorization Type [facility]    PT Start Time 1559    PT Stop Time 1622    PT Time Calculation (min) 23 min    Activity Tolerance Patient tolerated treatment well    Behavior During Therapy WFL for tasks assessed/performed             Past Medical History:  Diagnosis Date   Asthma    Essential hypertension, benign    GERD (gastroesophageal reflux disease)    IBS (irritable bowel syndrome)    Left anterior hemiblock    Other and unspecified hyperlipidemia    Sleep apnea    cpap   Type II or unspecified type diabetes mellitus without mention of complication, not stated as uncontrolled     Past Surgical History:  Procedure Laterality Date   FUNCTIONAL ENDOSCOPIC SINUS SURGERY  1990   LUMBAR LAMINECTOMY  2002   LUMBAR LAMINECTOMY  2005   SHOULDER ARTHROSCOPY WITH OPEN ROTATOR CUFF REPAIR Right 05/15/2020   Procedure: right shoulder arthroscopy, debridement, mini open rotator cuff tear repair, biceps tenodesis;  Surgeon: Meredith Pel, MD;  Location: [facility];  Service: Orthopedics;  Laterality: Right;   SHOULDER ARTHROSCOPY WITH OPEN ROTATOR CUFF REPAIR Right 05/21/2021   Procedure: RIGHT SHOULDER ARTHROSCOPY WITH OPEN ROTATOR CUFF REPAIR;  Surgeon: Meredith Pel, MD;  Location: [facility];  Service: Orthopedics;  Laterality: Right;   TONSILLECTOMY  1980    There were no vitals filed for this visit.   Subjective Assessment - 08/06/21 1611     Subjective very little pain upon arrival to PT  in her shoulder she relays    Patient Stated Goals regain use of RUE    Pain Onset More than a month ago               [facility] Adult PT Treatment/Exercise - 08/06/21 0001       Shoulder Exercises: Standing   External Rotation Right;20 reps    Theraband Level (Shoulder External Rotation) Level 2 (Red)    Internal Rotation Right;20 reps    Theraband Level (Shoulder Internal Rotation) Level 2 (Red)    Flexion AAROM;10 reps    Flexion Limitations 2# bar    ABduction Right;10 reps    ABduction Limitations holding 2# bar horizontal in Rt only    Extension Both;20 reps    Theraband Level (Shoulder Extension) Level 2 (Red)    Row Both;20 reps    Theraband Level (Shoulder Row) Level 2 (Red)    Other Standing Exercises IR behind her back 2# bar X 15, ER stretch in doorway 10 sec X5    Other Standing Exercises UE ranger with 2# X 10 circles      Shoulder Exercises: ROM/Strengthening   UBE (Upper Arm Bike) L2 X 2 min fwd, 2 min retro                       PT Short Term Goals - 07/11/21 1408  PT SHORT TERM GOAL #1   Title independent with initial HEP    Time 4    Period Weeks    Status Achieved    Target Date 07/30/21      PT SHORT TERM GOAL #2   Title improve Rt shoulder PROM to WNL for improved function    Time 4    Period Weeks    Status Achieved    Target Date 07/30/21               PT Long Term Goals - 07/11/21 1408       PT LONG TERM GOAL #1   Title Heather Stout will have an improved FOTO score to at least 61    Baseline -    Time 8    Period Weeks    Status On-going    Target Date 08/27/20      PT LONG TERM GOAL #2   Title Improve R shoulder AROM for flexion to 160 degrees; ER to 90; IR to 90 and abduction to at least 160 deg for improved motion    Baseline -    Time 8    Period Weeks    Status On-going      PT LONG TERM GOAL #3   Title Improve R shoulder strength to 4-/5 MMT    Baseline -    Time 8    Period Weeks    Status  On-going      PT LONG TERM GOAL #4   Title Heather Stout will be independent with her long-term HEP at DC.    Time 8    Period Weeks    Status On-going      PT LONG TERM GOAL #5   Title report no pain with activity for improved function    Time 8    Period Weeks    Status On-going                   Plan - 08/06/21 1633     Clinical Impression Statement She is doing very well overall. We continued to work on improving gentle strength to tolerance, her ROM is now WNL.    Personal Factors and Comorbidities Comorbidity 3+    Comorbidities asthma, HTN, OSA, DM, Rt RTC repair 2021, lumbar laminectomy    Examination-Activity Limitations Lift;Hygiene/Grooming;Dressing;Carry;Reach Overhead;Bathing;Bed Mobility    Examination-Participation Restrictions Cleaning;Meal Prep;Yard Work;Community Activity;Occupation;Driving;Shop;Laundry    Stability/Clinical Decision Making Evolving/Moderate complexity    Rehab Potential Excellent    PT Frequency 2x / week   1x/wk x 2 wks, then 2x/wk x 6 wks   PT Duration 8 weeks    PT Treatment/Interventions ADLs/Self Care Home Management;Cryotherapy;Electrical Stimulation;Moist Heat;Functional mobility training;Therapeutic activities;Therapeutic exercise;Neuromuscular re-education;Patient/family education;Manual techniques;Vasopneumatic Device;Taping;Dry needling;Passive range of motion    PT Next Visit Plan light strength program for Rt RTC    PT Home Exercise Plan Access Code: [email or web address], added rows, extensions and ER with red band    Consulted and Agree with Plan of Care Patient             Patient will benefit from skilled therapeutic intervention in order to improve the following deficits and impairments:  Pain, Postural dysfunction, Decreased range of motion, Decreased strength, Impaired UE functional use  Visit Diagnosis: Muscle weakness (generalized)  Chronic right shoulder pain  Stiffness of right shoulder, not elsewhere  classified  Localized edema     Problem List Patient Active Problem List   Diagnosis Date Noted   Traumatic complete tear  of right rotator cuff    Protrusion of lumbar intervertebral disc 10/25/2020   Essential hypertension 04/04/2015   Hyperlipemia 04/04/2015   Irritable bowel syndrome 04/04/2015   Diabetes ([place]) 11/29/2014    Debbe Odea, PT,DPT 08/06/2021, 4:34 PM  [facility] Physical Therapy [address] [place], [place], [address] Phone: [phone]   Fax:  [phone]  Name: Heather Stout MRN: [id number] Date of Birth: 12-15-1955

## 2021-08-08 ENCOUNTER — Other Ambulatory Visit (HOSPITAL_COMMUNITY): Payer: Self-pay

## 2021-08-08 ENCOUNTER — Other Ambulatory Visit: Payer: Self-pay

## 2021-08-08 MED FILL — Omeprazole Cap Delayed Release 40 MG: ORAL | 90 days supply | Qty: 90 | Fill #0 | Status: AC

## 2021-08-09 ENCOUNTER — Other Ambulatory Visit (HOSPITAL_COMMUNITY): Payer: Self-pay

## 2021-08-09 MED ORDER — EDARBYCLOR 40-25 MG PO TABS
1.0000 | ORAL_TABLET | Freq: Every day | ORAL | 0 refills | Status: DC
  Filled 2021-08-09: qty 90, 90d supply, fill #0

## 2021-08-10 ENCOUNTER — Other Ambulatory Visit (HOSPITAL_COMMUNITY): Payer: Self-pay

## 2021-08-13 ENCOUNTER — Other Ambulatory Visit (HOSPITAL_COMMUNITY): Payer: Self-pay

## 2021-08-13 ENCOUNTER — Encounter: Payer: No Typology Code available for payment source | Admitting: Physical Therapy

## 2021-08-13 MED FILL — Semaglutide Soln Pen-inj 1 MG/DOSE (4 MG/3ML): SUBCUTANEOUS | 28 days supply | Qty: 3 | Fill #0 | Status: AC

## 2021-08-14 ENCOUNTER — Other Ambulatory Visit (HOSPITAL_COMMUNITY): Payer: Self-pay

## 2021-08-14 MED ORDER — INSULIN LISPRO (1 UNIT DIAL) 100 UNIT/ML (KWIKPEN)
10.0000 [IU] | PEN_INJECTOR | Freq: Three times a day (TID) | SUBCUTANEOUS | 3 refills | Status: DC
  Filled 2021-08-14: qty 18, 30d supply, fill #0
  Filled 2021-10-09: qty 18, 30d supply, fill #1
  Filled 2021-11-08: qty 18, 30d supply, fill #2
  Filled 2022-01-23: qty 18, 30d supply, fill #3

## 2021-08-15 ENCOUNTER — Other Ambulatory Visit (HOSPITAL_COMMUNITY): Payer: Self-pay

## 2021-08-15 MED ORDER — DEXCOM G6 TRANSMITTER MISC
3 refills | Status: DC
  Filled 2021-08-28 – 2021-09-03 (×2): qty 1, 90d supply, fill #0

## 2021-08-15 MED ORDER — DEXCOM G6 SENSOR MISC
11 refills | Status: DC
  Filled 2021-08-28 – 2021-09-03 (×2): qty 3, 30d supply, fill #0
  Filled 2021-10-01: qty 3, 30d supply, fill #1
  Filled 2021-11-06: qty 3, 30d supply, fill #2

## 2021-08-15 MED ORDER — DEXCOM G6 RECEIVER DEVI: 0 refills | Status: DC

## 2021-08-21 ENCOUNTER — Other Ambulatory Visit (HOSPITAL_COMMUNITY): Payer: Self-pay

## 2021-08-22 ENCOUNTER — Ambulatory Visit (INDEPENDENT_AMBULATORY_CARE_PROVIDER_SITE_OTHER): Payer: No Typology Code available for payment source | Admitting: Physical Therapy

## 2021-08-22 ENCOUNTER — Other Ambulatory Visit: Payer: Self-pay

## 2021-08-22 DIAGNOSIS — R600 Localized edema: Secondary | ICD-10-CM

## 2021-08-22 DIAGNOSIS — M25611 Stiffness of right shoulder, not elsewhere classified: Secondary | ICD-10-CM | POA: Diagnosis not present

## 2021-08-22 DIAGNOSIS — M25511 Pain in right shoulder: Secondary | ICD-10-CM | POA: Diagnosis not present

## 2021-08-22 DIAGNOSIS — G8929 Other chronic pain: Secondary | ICD-10-CM

## 2021-08-22 DIAGNOSIS — M6281 Muscle weakness (generalized): Secondary | ICD-10-CM

## 2021-08-22 NOTE — Patient Instructions (Signed)
Access Code: [email or web address] URL: [email or web address] Date: 08/22/2021 Prepared by: Elsie Ra  Exercises Shoulder Abduction with Dumbbells - Thumbs Up - 2 x daily - 6 x weekly - 2 sets - 10-15 reps Standing Shoulder Flexion to 90 Degrees with Dumbbells - 2 x daily - 6 x weekly - 2 sets - 10-15 reps Shoulder Overhead Press in Flexion with Dumbbells - 2 x daily - 6 x weekly - 1-2 sets - 10 reps Shoulder External Rotation with Anchored Resistance - 2 x daily - 6 x weekly - 1 sets - 20 reps Standing Shoulder Internal Rotation with Anchored Resistance - 2 x daily - 6 x weekly - 1 sets - 20 reps Standing Row with Anchored Resistance - 2 x daily - 6 x weekly - 10-20 reps - 2-3 sets Shoulder extension with resistance - Neutral - 2 x daily - 6 x weekly - 10 reps - 2-3 sets

## 2021-08-22 NOTE — Therapy (Signed)
[facility] [address] [place], [place], [address] Phone: [phone]   Fax:  [phone]  Physical Therapy Treatment/Discharge PHYSICAL THERAPY DISCHARGE SUMMARY  Visits from Start of Care: 5  Current functional level related to goals / functional outcomes: See below   Remaining deficits: See below   Education / Equipment: HEP  Plan: Patient agrees to discharge.  Patient goals were  met. Patient is being discharged due to meeting the stated rehab goals.        Patient Details  Name: Heather Stout MRN: [id number] Date of Birth: [birth year] Referring Provider (PT): Marlou Sa Tonna Corner, MD   Encounter Date: 08/22/2021   PT End of Session - 08/22/21 1643     Visit Number 5    Number of Visits 12    Date for PT Re-Evaluation 08/27/21    Authorization Type [facility]    PT Start Time 1555    PT Stop Time 7628    PT Time Calculation (min) 40 min    Activity Tolerance Patient tolerated treatment well    Behavior During Therapy WFL for tasks assessed/performed             Past Medical History:  Diagnosis Date   Asthma    Essential hypertension, benign    GERD (gastroesophageal reflux disease)    IBS (irritable bowel syndrome)    Left anterior hemiblock    Other and unspecified hyperlipidemia    Sleep apnea    cpap   Type II or unspecified type diabetes mellitus without mention of complication, not stated as uncontrolled     Past Surgical History:  Procedure Laterality Date   FUNCTIONAL ENDOSCOPIC SINUS SURGERY  1990   LUMBAR LAMINECTOMY  2002   LUMBAR LAMINECTOMY  2005   SHOULDER ARTHROSCOPY WITH OPEN ROTATOR CUFF REPAIR Right 05/15/2020   Procedure: right shoulder arthroscopy, debridement, mini open rotator cuff tear repair, biceps tenodesis;  Surgeon: Meredith Pel, MD;  Location: [place];  Service: Orthopedics;  Laterality: Right;   SHOULDER ARTHROSCOPY WITH OPEN ROTATOR CUFF REPAIR Right  05/21/2021   Procedure: RIGHT SHOULDER ARTHROSCOPY WITH OPEN ROTATOR CUFF REPAIR;  Surgeon: Meredith Pel, MD;  Location: [place];  Service: Orthopedics;  Laterality: Right;   TONSILLECTOMY  1980    There were no vitals filed for this visit.   Subjective Assessment - 08/22/21 1638     Subjective relays her shoulder is great denies pain and feels ready to DC    Patient Stated Goals regain use of RUE    Pain Onset More than a month ago                [facility] PT Assessment - 08/22/21 0001       Assessment   Medical Diagnosis M75.121 (ICD-10-CM) - Nontraumatic complete tear of right rotator cuff    Referring Provider (PT) Meredith Pel, MD      ROM / Strength   AROM / PROM / Strength AROM      AROM   Overall AROM Comments Rt shoulder AROM now [facility] and equal to other side      Strength   Strength Assessment Site Shoulder    Right/Left Shoulder Right    Right Shoulder Flexion 4+/5    Right Shoulder ABduction 4+/5    Right Shoulder Internal Rotation 5/5    Right Shoulder External Rotation 5/5  [place] [facility] PT Treatment/Exercise - 08/22/21 0001       Shoulder Exercises: Standing   External Rotation Right;20 reps    Theraband Level (Shoulder External Rotation) Level 3 (Green)    Internal Rotation Right;20 reps    Theraband Level (Shoulder Internal Rotation) Level 3 (Green)    Flexion Both    Shoulder Flexion Weight (lbs) 3    Flexion Limitations 2X10    ABduction Both    Shoulder ABduction Weight (lbs) 2    ABduction Limitations 2X10    Extension Both;20 reps    Theraband Level (Shoulder Extension) Level 4 (Blue)    Row Both;20 reps    Theraband Level (Shoulder Row) Level 4 (Blue)    Other Standing Exercises IR behind her back 2# bar X 15, ER stretch in doorway 10 sec X5    Other Standing Exercises UE ranger with 2.5# X 10 circles, then flexion, then abd      Shoulder Exercises: ROM/Strengthening    UBE (Upper Arm Bike) L2.5 X 2 min fwd, 2 min retro                       PT Short Term Goals - 07/11/21 1408       PT SHORT TERM GOAL #1   Title independent with initial HEP    Time 4    Period Weeks    Status Achieved    Target Date 07/30/21      PT SHORT TERM GOAL #2   Title improve Rt shoulder PROM to WNL for improved function    Time 4    Period Weeks    Status Achieved    Target Date 07/30/21               PT Long Term Goals - 08/22/21 1642       PT LONG TERM GOAL #1   Title Sakiya will have an improved FOTO score to at least 61    Baseline -    Time 8    Period Weeks    Status Achieved    Target Date 08/27/20      PT LONG TERM GOAL #2   Title Improve R shoulder AROM for flexion to 160 degrees; ER to 90; IR to 90 and abduction to at least 160 deg for improved motion    Baseline -    Time 8    Period Weeks    Status Achieved      PT LONG TERM GOAL #3   Title Improve R shoulder strength to 4-/5 MMT    Baseline -    Time 8    Period Weeks    Status Achieved      PT LONG TERM GOAL #4   Title Chassity will be independent with her long-term HEP at DC.    Time 8    Period Weeks    Status Achieved      PT LONG TERM GOAL #5   Title report no pain with activity for improved function    Time 8    Period Weeks    Status Achieved                   Plan - 08/22/21 1644     Clinical Impression Statement She has done excellent with her PT. She now has full ROM and is only missing slight strength in her Rt shoulder. This will continue to improve with HEP and I expect her to  make a full recovery back to full function. She feels ready to discharge to advanced HEP so we will discharge today. She had no further questions or concerns.    Personal Factors and Comorbidities Comorbidity 3+    Comorbidities asthma, HTN, OSA, DM, Rt RTC repair 2021, lumbar laminectomy    Examination-Activity Limitations  Lift;Hygiene/Grooming;Dressing;Carry;Reach Overhead;Bathing;Bed Mobility    Examination-Participation Restrictions Cleaning;Meal Prep;Yard Work;Community Activity;Occupation;Driving;Shop;Laundry    Stability/Clinical Decision Making Evolving/Moderate complexity    Rehab Potential Excellent    PT Frequency 2x / week   1x/wk x 2 wks, then 2x/wk x 6 wks   PT Duration 8 weeks    PT Treatment/Interventions ADLs/Self Care Home Management;Cryotherapy;Electrical Stimulation;Moist Heat;Functional mobility training;Therapeutic activities;Therapeutic exercise;Neuromuscular re-education;Patient/family education;Manual techniques;Vasopneumatic Device;Taping;Dry needling;Passive range of motion    PT Next Visit Plan DC todaay    PT Home Exercise Plan Access Code: [email or web address]    Consulted and Agree with Plan of Care Patient             Patient will benefit from skilled therapeutic intervention in order to improve the following deficits and impairments:  Pain, Postural dysfunction, Decreased range of motion, Decreased strength, Impaired UE functional use  Visit Diagnosis: Muscle weakness (generalized)  Chronic right shoulder pain  Stiffness of right shoulder, not elsewhere classified  Localized edema     Problem List Patient Active Problem List   Diagnosis Date Noted   Traumatic complete tear of right rotator cuff    Protrusion of lumbar intervertebral disc 10/25/2020   Essential hypertension 04/04/2015   Hyperlipemia 04/04/2015   Irritable bowel syndrome 04/04/2015   Diabetes ([place]) 11/29/2014    Debbe Odea, PT,DPT 08/22/2021, 4:46 PM  [facility] [address] [place], [place], [address] Phone: [phone]   Fax:  [phone]  Name: OSA FOGARTY MRN: [id number] Date of Birth: 22-Jan-1956

## 2021-08-28 ENCOUNTER — Other Ambulatory Visit (HOSPITAL_COMMUNITY): Payer: Self-pay

## 2021-09-03 ENCOUNTER — Other Ambulatory Visit: Payer: Self-pay

## 2021-09-03 ENCOUNTER — Other Ambulatory Visit (HOSPITAL_COMMUNITY): Payer: Self-pay

## 2021-09-03 ENCOUNTER — Ambulatory Visit (INDEPENDENT_AMBULATORY_CARE_PROVIDER_SITE_OTHER): Payer: No Typology Code available for payment source | Admitting: Surgical

## 2021-09-03 DIAGNOSIS — M75121 Complete rotator cuff tear or rupture of right shoulder, not specified as traumatic: Secondary | ICD-10-CM

## 2021-09-04 ENCOUNTER — Other Ambulatory Visit (HOSPITAL_COMMUNITY): Payer: Self-pay

## 2021-09-05 ENCOUNTER — Other Ambulatory Visit (HOSPITAL_COMMUNITY): Payer: Self-pay

## 2021-09-05 ENCOUNTER — Other Ambulatory Visit: Payer: Self-pay

## 2021-09-05 MED ORDER — VIBERZI 100 MG PO TABS
100.0000 mg | ORAL_TABLET | ORAL | 5 refills | Status: DC
  Filled 2021-09-05: qty 60, 30d supply, fill #0
  Filled 2021-10-01 – 2021-10-03 (×2): qty 60, 30d supply, fill #1
  Filled 2021-11-06: qty 60, 30d supply, fill #2

## 2021-09-06 ENCOUNTER — Other Ambulatory Visit (HOSPITAL_COMMUNITY): Payer: Self-pay

## 2021-09-09 ENCOUNTER — Encounter: Payer: Self-pay | Admitting: Surgical

## 2021-09-09 NOTE — Progress Notes (Signed)
Post-Op Visit Note   Patient: Heather Stout           Date of Birth: Jan 31, 1956           MRN: [id number] Visit Date: 09/03/2021 PCP: Deland Pretty, MD   Assessment & Plan:  Chief Complaint:  Chief Complaint  Patient presents with   Right Shoulder - Follow-up   Visit Diagnoses:  1. Nontraumatic complete tear of right rotator cuff     Plan: Patient is a 66 year old female who presents s/p right shoulder arthroscopy with open revision rotator cuff tear repair on 05/21/2021.  Reports that she is doing well and has finished physical therapy about a week ago.  She notes occasional soreness after heavy activity such as cleaning that she did over the weekend but overall she feels very functional with the arm.  Not bothering her when she tries to sleep.  She is not doing any significant heavy lifting or overhead lifting on the regular.  On exam she has 60 degrees external rotation, 95 degrees AB duction, 170 degrees forward flexion.  Active range of motion equivalent to passive range of motion.  Incision is well-healed with no evidence of infection or dehiscence.  Axillary nerve intact with deltoid firing.  Excellent rotator cuff strength of supraspinatus, infraspinatus, subscapularis rated 5/5 with no hint of weakness compared to contralateral side.  She has no coarseness or crepitus noted with passive motion of the shoulder at 0 and 90 degrees of abduction today.  Plan is for patient to be released and she will follow-up with the office as needed if she has any concerns.  Did discuss that she should be careful about lifting heavy out and away from her body or any overhead heavy lifting as heavy lifting was responsible for her repeat tear this year.  She understands and will be cautious.  She understands this is not a 100% normal shoulder after 2 rotator cuff repairs.  Follow-up as needed.  Follow-Up Instructions: No follow-ups on file.   Orders:  No orders of the defined types were placed in  this encounter.  No orders of the defined types were placed in this encounter.   Imaging: No results found.  PMFS History: Patient Active Problem List   Diagnosis Date Noted   Traumatic complete tear of right rotator cuff    Protrusion of lumbar intervertebral disc 10/25/2020   Essential hypertension 04/04/2015   Hyperlipemia 04/04/2015   Irritable bowel syndrome 04/04/2015   Diabetes ([place]) 11/29/2014   Past Medical History:  Diagnosis Date   Asthma    Essential hypertension, benign    GERD (gastroesophageal reflux disease)    IBS (irritable bowel syndrome)    Left anterior hemiblock    Other and unspecified hyperlipidemia    Sleep apnea    cpap   Type II or unspecified type diabetes mellitus without mention of complication, not stated as uncontrolled     Family History  Problem Relation Age of Onset   Hypertension Father    Hypertension Brother    Diabetes Brother    Hypertension Brother    Crohn's disease Brother    Hypertension Sister    Diabetes Sister     Past Surgical History:  Procedure Laterality Date   FUNCTIONAL ENDOSCOPIC SINUS SURGERY  1990   LUMBAR LAMINECTOMY  2002   LUMBAR LAMINECTOMY  2005   SHOULDER ARTHROSCOPY WITH OPEN ROTATOR CUFF REPAIR Right 05/15/2020   Procedure: right shoulder arthroscopy, debridement, mini open rotator cuff tear repair,  biceps tenodesis;  Surgeon: Meredith Pel, MD;  Location: [place];  Service: Orthopedics;  Laterality: Right;   SHOULDER ARTHROSCOPY WITH OPEN ROTATOR CUFF REPAIR Right 05/21/2021   Procedure: RIGHT SHOULDER ARTHROSCOPY WITH OPEN ROTATOR CUFF REPAIR;  Surgeon: Meredith Pel, MD;  Location: [place];  Service: Orthopedics;  Laterality: Right;   TONSILLECTOMY  1980   Social History   Occupational History   Occupation: Insurance  Tobacco Use   Smoking status: Never   Smokeless tobacco: Never  Substance and Sexual Activity   Alcohol use: No    Alcohol/week:  0.0 standard drinks   Drug use: No   Sexual activity: Not on file

## 2021-09-10 ENCOUNTER — Other Ambulatory Visit (HOSPITAL_COMMUNITY): Payer: Self-pay

## 2021-09-10 ENCOUNTER — Other Ambulatory Visit: Payer: Self-pay

## 2021-09-12 ENCOUNTER — Other Ambulatory Visit: Payer: Self-pay

## 2021-09-12 ENCOUNTER — Other Ambulatory Visit (HOSPITAL_COMMUNITY): Payer: Self-pay

## 2021-09-13 ENCOUNTER — Other Ambulatory Visit (HOSPITAL_COMMUNITY): Payer: Self-pay

## 2021-09-13 MED ORDER — OZEMPIC (1 MG/DOSE) 4 MG/3ML ~~LOC~~ SOPN
1.0000 mg | PEN_INJECTOR | SUBCUTANEOUS | 3 refills | Status: DC
  Filled 2021-09-13 (×2): qty 9, 84d supply, fill #0

## 2021-10-01 ENCOUNTER — Other Ambulatory Visit (HOSPITAL_COMMUNITY): Payer: Self-pay

## 2021-10-02 ENCOUNTER — Other Ambulatory Visit (HOSPITAL_COMMUNITY): Payer: Self-pay

## 2021-10-02 MED ORDER — AMOXICILLIN-POT CLAVULANATE 875-125 MG PO TABS
1.0000 | ORAL_TABLET | Freq: Two times a day (BID) | ORAL | 0 refills | Status: DC
  Filled 2021-10-02 (×2): qty 14, 7d supply, fill #0

## 2021-10-03 ENCOUNTER — Other Ambulatory Visit (HOSPITAL_COMMUNITY): Payer: Self-pay

## 2021-10-09 ENCOUNTER — Other Ambulatory Visit (HOSPITAL_COMMUNITY): Payer: Self-pay

## 2021-10-23 IMAGING — XA DG FLUORO GUIDE NDL PLC/BX
1 series · 1 of 1 positions shown · non-contrast
Comparison: none

CLINICAL DATA: Right shoulder pain.

[Series 1: ortho standard · 1 of 1 slices shown]
[im 1/1]
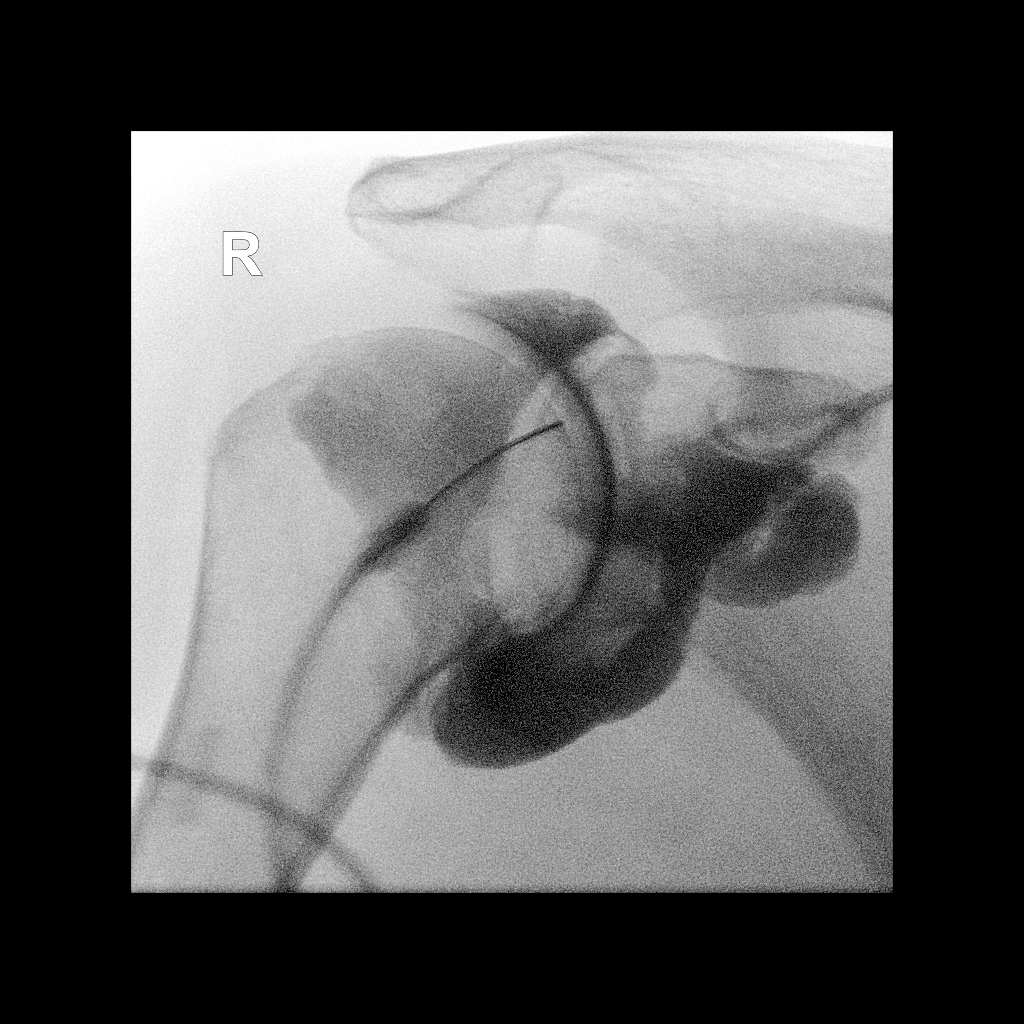

[1 of 1 positions shown; findings below may reference images not displayed]

FLUOROSCOPY TIME:  Radiation Exposure Index (as provided by the
fluoroscopic device): 7.27 uGy*m2

PROCEDURE:
Right SHOULDER INJECTION UNDER FLUOROSCOPY

An appropriate skin entrance site was determined. The site was
marked, prepped with Betadine, draped in the usual sterile fashion,
and infiltrated locally with buffered Lidocaine. 22 gauge spinal
needle was advanced to the superomedial margin of the humeral head
under intermittent fluoroscopy. 1 ml of Lidocaine injected easily. A
mixture of 0.1 ml Multihance and 20 ml of dilute Isovue M 200 was
then used to opacify the right shoulder capsule. No immediate
complication.
IMPRESSION: Technically successful right shoulder injection for MRI.

## 2021-10-24 ENCOUNTER — Other Ambulatory Visit (HOSPITAL_COMMUNITY): Payer: Self-pay

## 2021-11-06 ENCOUNTER — Other Ambulatory Visit (HOSPITAL_COMMUNITY): Payer: Self-pay

## 2021-11-06 ENCOUNTER — Other Ambulatory Visit: Payer: Self-pay

## 2021-11-07 ENCOUNTER — Other Ambulatory Visit (HOSPITAL_COMMUNITY): Payer: Self-pay

## 2021-11-07 MED ORDER — VIBERZI 100 MG PO TABS
100.0000 mg | ORAL_TABLET | Freq: Two times a day (BID) | ORAL | 3 refills | Status: DC
  Filled 2021-11-07: qty 180, 90d supply, fill #0
  Filled 2022-02-11 (×2): qty 180, 90d supply, fill #1

## 2021-11-08 ENCOUNTER — Other Ambulatory Visit (HOSPITAL_COMMUNITY): Payer: Self-pay

## 2021-11-08 MED ORDER — EDARBYCLOR 40-25 MG PO TABS
1.0000 | ORAL_TABLET | Freq: Every day | ORAL | 0 refills | Status: DC
  Filled 2021-11-08: qty 90, 90d supply, fill #0

## 2021-11-08 MED FILL — Omeprazole Cap Delayed Release 40 MG: ORAL | 90 days supply | Qty: 90 | Fill #1 | Status: AC

## 2021-11-09 ENCOUNTER — Other Ambulatory Visit (HOSPITAL_COMMUNITY): Payer: Self-pay

## 2021-11-13 ENCOUNTER — Other Ambulatory Visit (HOSPITAL_COMMUNITY): Payer: Self-pay

## 2021-11-14 ENCOUNTER — Other Ambulatory Visit (HOSPITAL_COMMUNITY): Payer: Self-pay

## 2021-11-14 MED ORDER — XIGDUO XR 10-500 MG PO TB24
1.0000 | ORAL_TABLET | Freq: Every day | ORAL | 11 refills | Status: DC
  Filled 2021-11-14: qty 30, 30d supply, fill #0

## 2021-11-15 ENCOUNTER — Other Ambulatory Visit (HOSPITAL_COMMUNITY): Payer: Self-pay

## 2021-11-15 MED ORDER — XIGDUO XR 10-500 MG PO TB24
1.0000 | ORAL_TABLET | Freq: Every day | ORAL | 1 refills | Status: DC
  Filled 2021-11-15 – 2021-12-24 (×2): qty 90, 90d supply, fill #0
  Filled 2022-03-25: qty 90, 90d supply, fill #1

## 2021-11-15 MED ORDER — OZEMPIC (2 MG/DOSE) 8 MG/3ML ~~LOC~~ SOPN
2.0000 mg | PEN_INJECTOR | SUBCUTANEOUS | 1 refills | Status: DC
  Filled 2021-11-15: qty 9, 84d supply, fill #0
  Filled 2022-01-30: qty 9, 84d supply, fill #1

## 2021-11-27 ENCOUNTER — Other Ambulatory Visit (HOSPITAL_COMMUNITY): Payer: Self-pay

## 2021-11-27 MED ORDER — DEXCOM G7 SENSOR MISC
1.0000 | 6 refills | Status: DC
  Filled 2021-11-27 – 2021-12-03 (×3): qty 3, 30d supply, fill #0

## 2021-11-28 ENCOUNTER — Other Ambulatory Visit (HOSPITAL_COMMUNITY): Payer: Self-pay

## 2021-11-29 ENCOUNTER — Other Ambulatory Visit: Payer: Self-pay

## 2021-11-29 ENCOUNTER — Other Ambulatory Visit (HOSPITAL_COMMUNITY): Payer: Self-pay

## 2021-11-29 DIAGNOSIS — R911 Solitary pulmonary nodule: Secondary | ICD-10-CM

## 2021-11-29 MED ORDER — OMEPRAZOLE 20 MG PO CPDR
20.0000 mg | DELAYED_RELEASE_CAPSULE | Freq: Two times a day (BID) | ORAL | 3 refills | Status: DC
  Filled 2021-11-29: qty 180, 90d supply, fill #0
  Filled 2022-02-19: qty 180, 90d supply, fill #1
  Filled 2022-07-08: qty 180, 90d supply, fill #2
  Filled 2022-10-14: qty 180, 90d supply, fill #3

## 2021-12-03 ENCOUNTER — Other Ambulatory Visit (HOSPITAL_COMMUNITY): Payer: Self-pay

## 2021-12-12 ENCOUNTER — Other Ambulatory Visit (HOSPITAL_COMMUNITY): Payer: Self-pay

## 2021-12-24 ENCOUNTER — Other Ambulatory Visit (HOSPITAL_COMMUNITY): Payer: Self-pay

## 2021-12-27 ENCOUNTER — Ambulatory Visit
Admission: RE | Admit: 2021-12-27 | Discharge: 2021-12-27 | Disposition: A | Discharge status: Home / Self Care | Payer: No Typology Code available for payment source | Source: Ambulatory Visit

## 2021-12-27 DIAGNOSIS — R911 Solitary pulmonary nodule: Secondary | ICD-10-CM

## 2022-01-02 ENCOUNTER — Other Ambulatory Visit (HOSPITAL_COMMUNITY): Payer: Self-pay

## 2022-01-02 MED ORDER — DOXYCYCLINE HYCLATE 100 MG PO TABS
100.0000 mg | ORAL_TABLET | Freq: Two times a day (BID) | ORAL | 0 refills | Status: AC
  Filled 2022-01-02: qty 14, 7d supply, fill #0

## 2022-01-15 ENCOUNTER — Other Ambulatory Visit (HOSPITAL_COMMUNITY): Payer: Self-pay

## 2022-01-16 ENCOUNTER — Other Ambulatory Visit (HOSPITAL_COMMUNITY): Payer: Self-pay

## 2022-01-16 ENCOUNTER — Other Ambulatory Visit: Payer: Self-pay

## 2022-01-16 MED ORDER — FENOFIBRATE 145 MG PO TABS
145.0000 mg | ORAL_TABLET | Freq: Every day | ORAL | 1 refills | Status: DC
  Filled 2022-01-16: qty 90, 90d supply, fill #0
  Filled 2022-04-13: qty 90, 90d supply, fill #1

## 2022-01-22 ENCOUNTER — Other Ambulatory Visit (HOSPITAL_COMMUNITY): Payer: Self-pay

## 2022-01-23 ENCOUNTER — Other Ambulatory Visit (HOSPITAL_COMMUNITY): Payer: Self-pay

## 2022-01-23 MED ORDER — INSULIN LISPRO (1 UNIT DIAL) 100 UNIT/ML (KWIKPEN)
10.0000 [IU] | PEN_INJECTOR | Freq: Three times a day (TID) | SUBCUTANEOUS | 3 refills | Status: DC
  Filled 2022-01-23: qty 18, 30d supply, fill #0
  Filled 2022-03-12: qty 18, 30d supply, fill #1
  Filled 2022-05-07: qty 18, 30d supply, fill #2
  Filled 2022-06-24: qty 18, 30d supply, fill #3

## 2022-01-24 ENCOUNTER — Other Ambulatory Visit (HOSPITAL_COMMUNITY): Payer: Self-pay

## 2022-01-30 ENCOUNTER — Other Ambulatory Visit (HOSPITAL_COMMUNITY): Payer: Self-pay

## 2022-01-31 ENCOUNTER — Other Ambulatory Visit (HOSPITAL_COMMUNITY): Payer: Self-pay

## 2022-01-31 MED ORDER — ALBUTEROL SULFATE HFA 108 (90 BASE) MCG/ACT IN AERS
1.0000 | INHALATION_SPRAY | RESPIRATORY_TRACT | 2 refills | Status: AC | PRN
  Filled 2022-01-31: qty 18, 33d supply, fill #0
  Filled 2022-12-09: qty 6.7, 25d supply, fill #1

## 2022-02-11 ENCOUNTER — Other Ambulatory Visit (HOSPITAL_COMMUNITY): Payer: Self-pay

## 2022-02-11 MED ORDER — TRESIBA FLEXTOUCH 200 UNIT/ML ~~LOC~~ SOPN
24.0000 [IU] | PEN_INJECTOR | Freq: Every day | SUBCUTANEOUS | 5 refills | Status: DC
  Filled 2022-02-11: qty 9, 75d supply, fill #0
  Filled 2022-04-23: qty 9, 75d supply, fill #1
  Filled 2022-07-08: qty 9, 75d supply, fill #2
  Filled 2022-11-04: qty 9, 75d supply, fill #3

## 2022-02-14 ENCOUNTER — Other Ambulatory Visit (HOSPITAL_COMMUNITY): Payer: Self-pay

## 2022-02-14 MED ORDER — FREESTYLE LITE W/DEVICE KIT
1.0000 | PACK | Freq: Three times a day (TID) | 0 refills | Status: DC
  Filled 2022-02-14: qty 1, 1d supply, fill #0

## 2022-02-14 MED ORDER — ACCU-CHEK GUIDE VI STRP
1.0000 | ORAL_STRIP | Freq: Three times a day (TID) | 3 refills | Status: DC
Start: 2022-02-14 — End: 2022-12-23
  Filled 2022-02-14: qty 250, 84d supply, fill #0

## 2022-02-19 ENCOUNTER — Other Ambulatory Visit (HOSPITAL_COMMUNITY): Payer: Self-pay

## 2022-02-19 MED ORDER — FREESTYLE LIBRE 14 DAY SENSOR MISC
3 refills | Status: DC
  Filled 2022-02-19: qty 2, 28d supply, fill #0
  Filled 2022-03-25: qty 2, 28d supply, fill #1
  Filled 2022-04-13 – 2022-04-23 (×2): qty 2, 28d supply, fill #2
  Filled 2022-05-20: qty 2, 28d supply, fill #3

## 2022-02-19 MED ORDER — EDARBYCLOR 40-25 MG PO TABS
1.0000 | ORAL_TABLET | Freq: Every day | ORAL | 2 refills | Status: DC
  Filled 2022-02-19: qty 90, 90d supply, fill #0
  Filled 2022-04-23: qty 90, 90d supply, fill #1
  Filled 2022-08-25: qty 90, 90d supply, fill #2

## 2022-03-12 ENCOUNTER — Other Ambulatory Visit (HOSPITAL_COMMUNITY): Payer: Self-pay

## 2022-03-25 ENCOUNTER — Other Ambulatory Visit (HOSPITAL_COMMUNITY): Payer: Self-pay

## 2022-04-15 ENCOUNTER — Other Ambulatory Visit (HOSPITAL_COMMUNITY): Payer: Self-pay

## 2022-04-23 ENCOUNTER — Other Ambulatory Visit (HOSPITAL_COMMUNITY): Payer: Self-pay

## 2022-04-23 MED ORDER — LOTEMAX SM 0.38 % OP GEL
1.0000 [drp] | Freq: Three times a day (TID) | OPHTHALMIC | 0 refills | Status: DC
  Filled 2022-04-23: qty 5, 33d supply, fill #0
  Filled 2022-04-24: qty 5, 30d supply, fill #0

## 2022-04-23 MED ORDER — OZEMPIC (2 MG/DOSE) 8 MG/3ML ~~LOC~~ SOPN
2.0000 mg | PEN_INJECTOR | SUBCUTANEOUS | 0 refills | Status: DC
  Filled 2022-04-23: qty 9, 84d supply, fill #0

## 2022-04-24 ENCOUNTER — Other Ambulatory Visit (HOSPITAL_COMMUNITY): Payer: Self-pay

## 2022-04-30 ENCOUNTER — Other Ambulatory Visit (HOSPITAL_COMMUNITY): Payer: Self-pay

## 2022-05-01 ENCOUNTER — Other Ambulatory Visit (HOSPITAL_COMMUNITY): Payer: Self-pay

## 2022-05-02 ENCOUNTER — Other Ambulatory Visit (HOSPITAL_COMMUNITY): Payer: Self-pay

## 2022-05-06 ENCOUNTER — Other Ambulatory Visit (HOSPITAL_COMMUNITY): Payer: Self-pay

## 2022-05-06 MED ORDER — VIBERZI 100 MG PO TABS
100.0000 mg | ORAL_TABLET | Freq: Two times a day (BID) | ORAL | 1 refills | Status: DC
  Filled 2022-05-06 – 2022-05-10 (×3): qty 60, 30d supply, fill #0

## 2022-05-07 ENCOUNTER — Other Ambulatory Visit (HOSPITAL_COMMUNITY): Payer: Self-pay

## 2022-05-07 MED ORDER — NITROFURANTOIN MACROCRYSTAL 100 MG PO CAPS
100.0000 mg | ORAL_CAPSULE | Freq: Two times a day (BID) | ORAL | 0 refills | Status: DC
  Filled 2022-05-07 – 2022-05-23 (×2): qty 14, 7d supply, fill #0

## 2022-05-07 MED ORDER — NITROFURANTOIN MONOHYD MACRO 100 MG PO CAPS
100.0000 mg | ORAL_CAPSULE | Freq: Two times a day (BID) | ORAL | 0 refills | Status: DC
  Filled 2022-05-07: qty 14, 7d supply, fill #0

## 2022-05-08 ENCOUNTER — Other Ambulatory Visit (HOSPITAL_COMMUNITY): Payer: Self-pay

## 2022-05-10 ENCOUNTER — Other Ambulatory Visit (HOSPITAL_COMMUNITY): Payer: Self-pay

## 2022-05-13 ENCOUNTER — Other Ambulatory Visit (HOSPITAL_COMMUNITY): Payer: Self-pay

## 2022-05-13 MED ORDER — INSULIN PEN NEEDLE 31G X 5 MM MISC
3 refills | Status: DC
  Filled 2022-05-13: qty 300, 75d supply, fill #0
  Filled 2022-07-22: qty 300, 75d supply, fill #1
  Filled 2022-10-07: qty 300, 75d supply, fill #2
  Filled 2022-12-23 – 2022-12-26 (×2): qty 300, 75d supply, fill #3
  Filled 2023-03-05: qty 300, 75d supply, fill #4

## 2022-05-15 ENCOUNTER — Other Ambulatory Visit (HOSPITAL_COMMUNITY): Payer: Self-pay

## 2022-05-15 MED ORDER — XIIDRA 5 % OP SOLN
1.0000 [drp] | Freq: Two times a day (BID) | OPHTHALMIC | 3 refills | Status: AC
  Filled 2022-05-15: qty 180, 90d supply, fill #0

## 2022-05-16 ENCOUNTER — Other Ambulatory Visit (HOSPITAL_COMMUNITY): Payer: Self-pay

## 2022-05-20 ENCOUNTER — Other Ambulatory Visit (HOSPITAL_COMMUNITY): Payer: Self-pay

## 2022-05-21 ENCOUNTER — Other Ambulatory Visit (HOSPITAL_COMMUNITY): Payer: Self-pay

## 2022-05-21 MED ORDER — MOUNJARO 5 MG/0.5ML ~~LOC~~ SOAJ
5.0000 mg | SUBCUTANEOUS | 1 refills | Status: DC
  Filled 2022-05-21: qty 4, 56d supply, fill #0

## 2022-05-21 MED ORDER — LEVOTHYROXINE SODIUM 25 MCG PO TABS
25.0000 ug | ORAL_TABLET | Freq: Every morning | ORAL | 1 refills | Status: DC
  Filled 2022-05-21: qty 30, 30d supply, fill #0
  Filled 2022-06-12: qty 30, 30d supply, fill #1

## 2022-05-23 ENCOUNTER — Other Ambulatory Visit (HOSPITAL_COMMUNITY): Payer: Self-pay

## 2022-05-27 ENCOUNTER — Other Ambulatory Visit (HOSPITAL_COMMUNITY): Payer: Self-pay

## 2022-06-03 ENCOUNTER — Other Ambulatory Visit (HOSPITAL_COMMUNITY): Payer: Self-pay

## 2022-06-03 MED ORDER — HYOSCYAMINE SULFATE ER 0.375 MG PO TB12
0.3750 mg | ORAL_TABLET | Freq: Two times a day (BID) | ORAL | 5 refills | Status: DC | PRN
  Filled 2022-06-03: qty 60, 30d supply, fill #0
  Filled 2022-08-05: qty 60, 30d supply, fill #1
  Filled 2022-09-30: qty 60, 30d supply, fill #2
  Filled 2022-10-29: qty 60, 30d supply, fill #3

## 2022-06-11 ENCOUNTER — Other Ambulatory Visit (HOSPITAL_COMMUNITY): Payer: Self-pay

## 2022-06-11 MED ORDER — FREESTYLE LIBRE 14 DAY SENSOR MISC
6 refills | Status: DC
  Filled 2022-06-11: qty 2, 28d supply, fill #0
  Filled 2022-07-08: qty 2, 28d supply, fill #1
  Filled 2022-08-05: qty 2, 28d supply, fill #2
  Filled 2022-09-01: qty 2, 28d supply, fill #3
  Filled 2022-10-07: qty 2, 28d supply, fill #4
  Filled 2022-11-04: qty 2, 28d supply, fill #5
  Filled 2022-11-25: qty 2, 28d supply, fill #6

## 2022-06-11 MED ORDER — VIBERZI 100 MG PO TABS
100.0000 mg | ORAL_TABLET | Freq: Two times a day (BID) | ORAL | 3 refills | Status: DC
  Filled 2022-06-11: qty 180, 90d supply, fill #0
  Filled 2022-09-16: qty 180, 90d supply, fill #1

## 2022-06-13 ENCOUNTER — Other Ambulatory Visit (HOSPITAL_COMMUNITY): Payer: Self-pay

## 2022-06-14 ENCOUNTER — Other Ambulatory Visit (HOSPITAL_COMMUNITY): Payer: Self-pay

## 2022-06-24 ENCOUNTER — Other Ambulatory Visit (HOSPITAL_COMMUNITY): Payer: Self-pay

## 2022-06-25 ENCOUNTER — Other Ambulatory Visit (HOSPITAL_COMMUNITY): Payer: Self-pay

## 2022-06-25 MED ORDER — XIGDUO XR 10-500 MG PO TB24
1.0000 | ORAL_TABLET | Freq: Every day | ORAL | 5 refills | Status: DC
  Filled 2022-06-25: qty 30, 30d supply, fill #0
  Filled 2022-07-22: qty 30, 30d supply, fill #1
  Filled 2022-08-25: qty 30, 30d supply, fill #2
  Filled 2022-09-26: qty 30, 30d supply, fill #3
  Filled 2022-10-29: qty 30, 30d supply, fill #4
  Filled 2022-11-25: qty 30, 30d supply, fill #5

## 2022-06-25 MED ORDER — MOUNJARO 7.5 MG/0.5ML ~~LOC~~ SOAJ
7.5000 mg | SUBCUTANEOUS | 6 refills | Status: DC
  Filled 2022-06-25 – 2022-07-08 (×4): qty 2, 28d supply, fill #0
  Filled 2022-08-05: qty 2, 28d supply, fill #1
  Filled 2022-09-01: qty 2, 28d supply, fill #2
  Filled 2022-09-30: qty 2, 28d supply, fill #3
  Filled 2022-10-29: qty 2, 28d supply, fill #4
  Filled 2022-11-25: qty 2, 28d supply, fill #5
  Filled 2022-12-23 – 2022-12-26 (×3): qty 2, 28d supply, fill #6

## 2022-07-02 ENCOUNTER — Other Ambulatory Visit (HOSPITAL_COMMUNITY): Payer: Self-pay

## 2022-07-02 MED ORDER — PREDNISONE 20 MG PO TABS
40.0000 mg | ORAL_TABLET | Freq: Every day | ORAL | 0 refills | Status: DC
  Filled 2022-07-02: qty 10, 5d supply, fill #0

## 2022-07-02 MED ORDER — FLUTICASONE FUROATE-VILANTEROL 100-25 MCG/ACT IN AEPB
1.0000 | INHALATION_SPRAY | Freq: Every day | RESPIRATORY_TRACT | 0 refills | Status: DC
  Filled 2022-07-02: qty 60, 30d supply, fill #0

## 2022-07-08 ENCOUNTER — Other Ambulatory Visit (HOSPITAL_COMMUNITY): Payer: Self-pay

## 2022-07-11 ENCOUNTER — Other Ambulatory Visit (HOSPITAL_COMMUNITY): Payer: Self-pay

## 2022-07-22 ENCOUNTER — Other Ambulatory Visit (HOSPITAL_COMMUNITY): Payer: Self-pay

## 2022-07-22 MED ORDER — FENOFIBRATE 145 MG PO TABS
145.0000 mg | ORAL_TABLET | Freq: Every day | ORAL | 1 refills | Status: DC
  Filled 2022-07-22: qty 90, 90d supply, fill #0
  Filled 2022-10-29: qty 90, 90d supply, fill #1

## 2022-08-05 ENCOUNTER — Other Ambulatory Visit (HOSPITAL_COMMUNITY): Payer: Self-pay

## 2022-08-05 ENCOUNTER — Other Ambulatory Visit: Payer: Self-pay

## 2022-08-06 ENCOUNTER — Other Ambulatory Visit (HOSPITAL_COMMUNITY): Payer: Self-pay

## 2022-08-06 MED ORDER — INSULIN LISPRO (1 UNIT DIAL) 100 UNIT/ML (KWIKPEN)
10.0000 [IU] | PEN_INJECTOR | Freq: Three times a day (TID) | SUBCUTANEOUS | 3 refills | Status: DC
  Filled 2022-08-06: qty 18, 30d supply, fill #0
  Filled 2022-09-01: qty 18, 30d supply, fill #1
  Filled 2022-11-04: qty 18, 30d supply, fill #2
  Filled 2022-12-31: qty 18, 30d supply, fill #3

## 2022-08-07 ENCOUNTER — Other Ambulatory Visit (HOSPITAL_COMMUNITY): Payer: Self-pay

## 2022-08-07 MED ORDER — LEVOTHYROXINE SODIUM 25 MCG PO TABS
25.0000 ug | ORAL_TABLET | Freq: Every morning | ORAL | 1 refills | Status: DC
  Filled 2022-08-07: qty 30, 30d supply, fill #0

## 2022-08-14 ENCOUNTER — Other Ambulatory Visit (HOSPITAL_COMMUNITY): Payer: Self-pay

## 2022-08-27 ENCOUNTER — Other Ambulatory Visit: Payer: Self-pay

## 2022-08-29 DIAGNOSIS — E1165 Type 2 diabetes mellitus with hyperglycemia: Secondary | ICD-10-CM | POA: Diagnosis not present

## 2022-09-02 ENCOUNTER — Other Ambulatory Visit: Payer: Self-pay

## 2022-09-03 DIAGNOSIS — N1832 Chronic kidney disease, stage 3b: Secondary | ICD-10-CM | POA: Diagnosis not present

## 2022-09-03 DIAGNOSIS — E1129 Type 2 diabetes mellitus with other diabetic kidney complication: Secondary | ICD-10-CM | POA: Diagnosis not present

## 2022-09-03 DIAGNOSIS — E039 Hypothyroidism, unspecified: Secondary | ICD-10-CM | POA: Diagnosis not present

## 2022-09-04 ENCOUNTER — Other Ambulatory Visit (HOSPITAL_COMMUNITY): Payer: Self-pay

## 2022-09-16 ENCOUNTER — Other Ambulatory Visit (HOSPITAL_COMMUNITY): Payer: Self-pay

## 2022-09-16 MED ORDER — LEVOTHYROXINE SODIUM 25 MCG PO CAPS
25.0000 ug | ORAL_CAPSULE | Freq: Every morning | ORAL | 1 refills | Status: DC
  Filled 2022-09-16 – 2022-09-18 (×3): qty 30, 30d supply, fill #0

## 2022-09-18 ENCOUNTER — Encounter: Payer: Self-pay | Admitting: Gastroenterology

## 2022-09-18 ENCOUNTER — Other Ambulatory Visit (HOSPITAL_COMMUNITY): Payer: Self-pay

## 2022-09-19 ENCOUNTER — Other Ambulatory Visit (HOSPITAL_COMMUNITY): Payer: Self-pay

## 2022-09-19 MED ORDER — GOLYTELY 236 G PO SOLR
ORAL | 0 refills | Status: DC
  Filled 2022-09-19: qty 4000, 1d supply, fill #0

## 2022-09-19 MED ORDER — VIBERZI 100 MG PO TABS
100.0000 mg | ORAL_TABLET | Freq: Two times a day (BID) | ORAL | 3 refills | Status: DC
  Filled 2022-09-19: qty 180, 90d supply, fill #0

## 2022-09-20 ENCOUNTER — Other Ambulatory Visit (HOSPITAL_COMMUNITY): Payer: Self-pay

## 2022-09-24 ENCOUNTER — Other Ambulatory Visit (HOSPITAL_COMMUNITY): Payer: Self-pay

## 2022-09-24 ENCOUNTER — Ambulatory Visit (AMBULATORY_SURGERY_CENTER): Payer: 59

## 2022-09-24 VITALS — Ht 67.0 in | Wt 174.0 lb

## 2022-09-24 DIAGNOSIS — Z1211 Encounter for screening for malignant neoplasm of colon: Secondary | ICD-10-CM

## 2022-09-24 MED ORDER — NA SULFATE-K SULFATE-MG SULF 17.5-3.13-1.6 GM/177ML PO SOLN
1.0000 | Freq: Once | ORAL | 0 refills | Status: AC
  Filled 2022-09-24: qty 354, 2d supply, fill #0
  Filled 2022-09-26: qty 354, 1d supply, fill #0

## 2022-09-24 NOTE — Progress Notes (Signed)

## 2022-09-26 ENCOUNTER — Other Ambulatory Visit (HOSPITAL_COMMUNITY): Payer: Self-pay

## 2022-09-29 IMAGING — XA DG FLUORO GUIDE NDL PLC/BX
2 series · 2 of 2 positions shown · non-contrast
Comparison: none

CLINICAL DATA: 65-year-old female with chronic right shoulder pain.
History of prior rotator cuff repair with concern for recurrent
injury.

[Series 1: ortho adipose · 1 of 1 slices shown (1 of 2)]
[im 1/1]
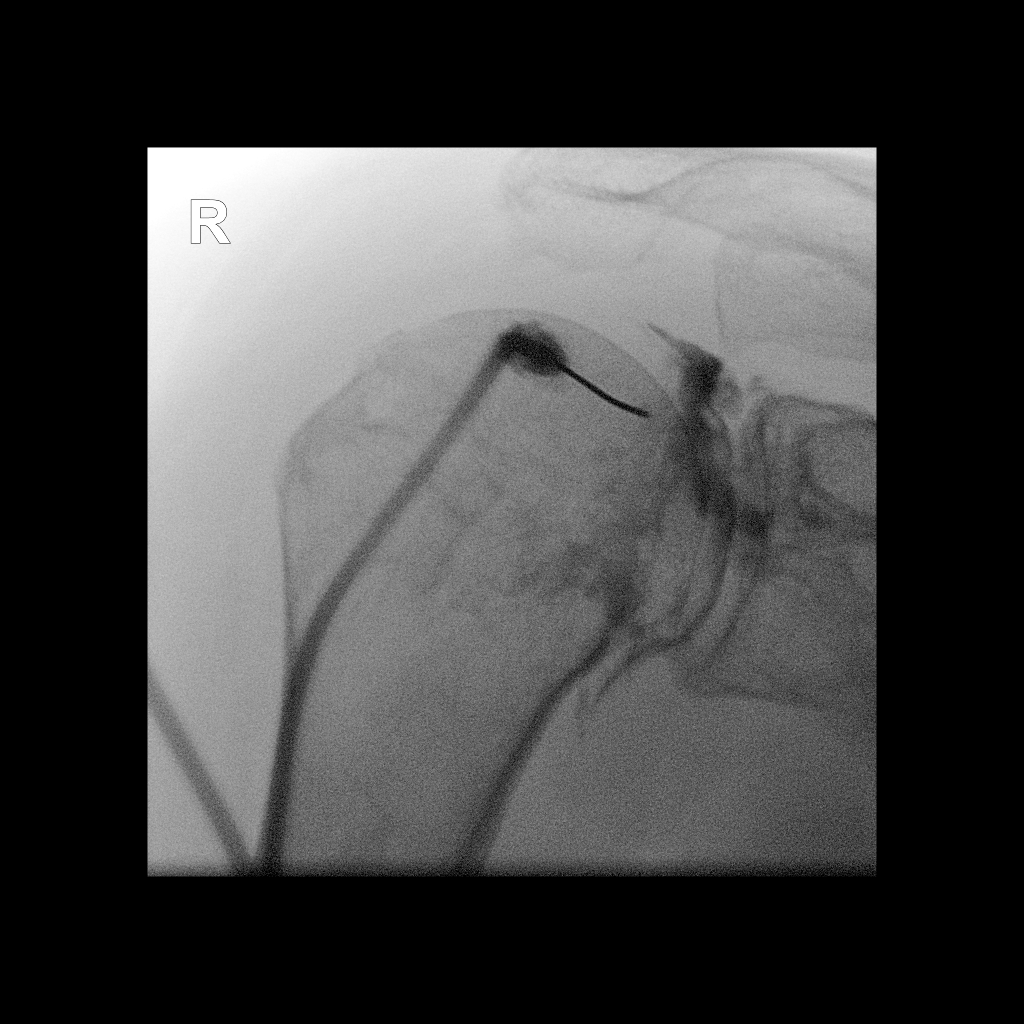

[Series 2: ortho adipose · 1 of 1 slices shown (2 of 2)]
[im 1/1]
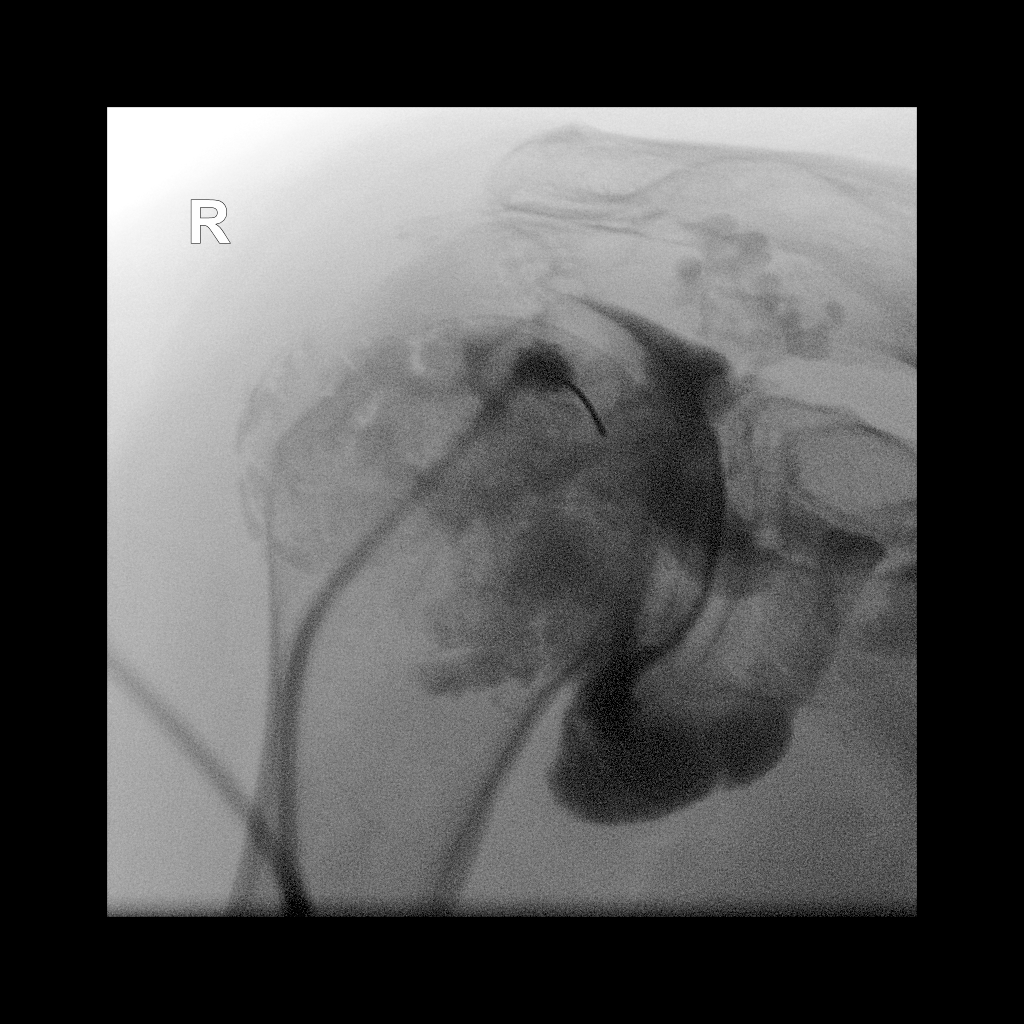

[2 of 2 positions shown; findings below may reference images not displayed]

EXAM:
SHOULDER INJECTION FOR MRI

FLUOROSCOPY TIME:  0 minutes 47 seconds 2.9 mGy

PROCEDURE:
After a thorough discussion of risks and benefits of the procedure,
written and oral informed consent was obtained. The consent
discussion included the risk of bleeding, infection and injury to
nerves and adjacent blood vessels. Extra-articular injection was
also a possible risk discussed.

Preliminary localization was performed over the right shoulder. The
area was marked over superior medial anterior humeral head.

After prep and drape in the usual sterile fashion, the skin and
deeper subcutaneous tissues were anesthetized with 1% Lidocaine
without Epinephrine. Under fluoroscopic guidance, a 22 gauge
inch spinal needle was advanced into the joint over the superior
medial anterior humeral head using an anterior approach.
Intra-articular injection of Lidocaine was performed which flowed
freely and subsequently the joint was distended with 15 ml of a
[DATE] dilution of Multihance contrast. The MR arthrogram solution
was as follows: 15 ml of omnipaque 180 contrast agent, 0.1 mL
Multihance, 5 ml of 1% Lidocaine. An end point was felt as well as
the patient experiencing pressure and the injection was
discontinued, the needle removed, and a sterile dressing applied.
The patient was taken to MRI for subsequent imaging.

The patient tolerated the procedure well and there were no
complications.
IMPRESSION: Successful right shoulder fluoroscopically guided injection.

## 2022-09-30 ENCOUNTER — Other Ambulatory Visit: Payer: Self-pay

## 2022-09-30 ENCOUNTER — Other Ambulatory Visit (HOSPITAL_COMMUNITY): Payer: Self-pay

## 2022-10-01 DIAGNOSIS — N1832 Chronic kidney disease, stage 3b: Secondary | ICD-10-CM | POA: Diagnosis not present

## 2022-10-02 ENCOUNTER — Other Ambulatory Visit (HOSPITAL_COMMUNITY): Payer: Self-pay

## 2022-10-08 ENCOUNTER — Other Ambulatory Visit: Payer: Self-pay

## 2022-10-08 ENCOUNTER — Other Ambulatory Visit (HOSPITAL_COMMUNITY): Payer: Self-pay

## 2022-10-15 ENCOUNTER — Encounter: Payer: 59 | Admitting: Gastroenterology

## 2022-10-16 ENCOUNTER — Encounter: Payer: Self-pay | Admitting: Gastroenterology

## 2022-10-29 ENCOUNTER — Other Ambulatory Visit (HOSPITAL_COMMUNITY): Payer: Self-pay

## 2022-10-30 ENCOUNTER — Other Ambulatory Visit (HOSPITAL_COMMUNITY): Payer: Self-pay

## 2022-10-30 ENCOUNTER — Encounter: Payer: Self-pay | Admitting: Gastroenterology

## 2022-10-30 ENCOUNTER — Ambulatory Visit (AMBULATORY_SURGERY_CENTER): Payer: 59 | Admitting: Gastroenterology

## 2022-10-30 VITALS — BP 106/53 | HR 71 | Temp 97.3°F | Resp 15

## 2022-10-30 DIAGNOSIS — D120 Benign neoplasm of cecum: Secondary | ICD-10-CM

## 2022-10-30 DIAGNOSIS — J45909 Unspecified asthma, uncomplicated: Secondary | ICD-10-CM | POA: Diagnosis not present

## 2022-10-30 DIAGNOSIS — D123 Benign neoplasm of transverse colon: Secondary | ICD-10-CM

## 2022-10-30 DIAGNOSIS — K635 Polyp of colon: Secondary | ICD-10-CM | POA: Diagnosis not present

## 2022-10-30 DIAGNOSIS — Z1211 Encounter for screening for malignant neoplasm of colon: Secondary | ICD-10-CM | POA: Diagnosis not present

## 2022-10-30 DIAGNOSIS — D122 Benign neoplasm of ascending colon: Secondary | ICD-10-CM

## 2022-10-30 DIAGNOSIS — E119 Type 2 diabetes mellitus without complications: Secondary | ICD-10-CM | POA: Diagnosis not present

## 2022-10-30 DIAGNOSIS — K5730 Diverticulosis of large intestine without perforation or abscess without bleeding: Secondary | ICD-10-CM

## 2022-10-30 DIAGNOSIS — G4730 Sleep apnea, unspecified: Secondary | ICD-10-CM | POA: Diagnosis not present

## 2022-10-30 MED ORDER — SODIUM CHLORIDE 0.9 % IV SOLN: 500.0000 mL | Freq: Once | INTRAVENOUS | Status: DC

## 2022-10-30 NOTE — Progress Notes (Unsigned)
Vss nad trans to pacu °

## 2022-10-30 NOTE — Progress Notes (Unsigned)
GASTROENTEROLOGY PROCEDURE H&P NOTE   Primary Care Physician: Deland Pretty, MD    Reason for Procedure:  Colon Cancer screening  Plan:    Colonoscopy  Patient is appropriate for endoscopic procedure(s) in the ambulatory ([place]) setting.  The nature of the procedure, as well as the risks, benefits, and alternatives were carefully and thoroughly reviewed with the patient. Ample time for discussion and questions allowed. The patient understood, was satisfied, and agreed to proceed.     HPI: Heather Stout is a 67 y.o. female who presents for colonoscopy for routine Colon Cancer screening.  No active GI symptoms.  No known family history of colon cancer or related malignancy.  Patient is otherwise without complaints or active issues today.  Past Medical History:  Diagnosis Date   Asthma    Essential hypertension, benign    GERD (gastroesophageal reflux disease)    IBS (irritable bowel syndrome)    Left anterior hemiblock    Other and unspecified hyperlipidemia    Sleep apnea    cpap   Thyroid disease    Type II or unspecified type diabetes mellitus without mention of complication, not stated as uncontrolled     Past Surgical History:  Procedure Laterality Date   FUNCTIONAL ENDOSCOPIC SINUS SURGERY  08/26/1988   KNEE ARTHROSCOPY Left 2013   LUMBAR LAMINECTOMY  08/26/2000   LUMBAR LAMINECTOMY  08/27/2003   SHOULDER ARTHROSCOPY WITH OPEN ROTATOR CUFF REPAIR Right 05/15/2020   Procedure: right shoulder arthroscopy, debridement, mini open rotator cuff tear repair, biceps tenodesis;  Surgeon: Meredith Pel, MD;  Location: [facility];  Service: Orthopedics;  Laterality: Right;   SHOULDER ARTHROSCOPY WITH OPEN ROTATOR CUFF REPAIR Right 05/21/2021   Procedure: RIGHT SHOULDER ARTHROSCOPY WITH OPEN ROTATOR CUFF REPAIR;  Surgeon: Meredith Pel, MD;  Location: [facility];  Service: Orthopedics;  Laterality: Right;   TONSILLECTOMY  08/26/1978     Prior to Admission medications   Medication Sig Start Date End Date Taking? Authorizing Provider  aspirin 81 MG tablet Take 81 mg by mouth daily.   Yes [provider]  Azilsartan-Chlorthalidone (EDARBYCLOR) 40-25 MG TABS Take 1 tablet by mouth daily. 02/19/22  Yes   Blood Glucose Monitoring Suppl (FREESTYLE LITE) w/Device KIT use meter to test blood sugar 3 times daily 02/14/22  Yes   cetirizine (ZYRTEC) 10 MG tablet Take 10 mg by mouth daily.   Yes [provider]  cholecalciferol (VITAMIN D) 1000 UNITS tablet Take 2,000 Units by mouth daily.    Yes [provider]  Continuous Blood Gluc Receiver (DEXCOM G6 RECEIVER) DEVI Use as directed 08/15/21  Yes   Continuous Blood Gluc Sensor (DEXCOM G6 SENSOR) MISC Change every 10 days as directed 08/15/21  Yes   Continuous Blood Gluc Sensor (FREESTYLE LIBRE 14 DAY SENSOR) MISC Use as directed change every 14 days 06/11/22  Yes   Continuous Blood Gluc Transmit (DEXCOM G6 TRANSMITTER) MISC Use as directed every 90 days 08/15/21  Yes   Cyanocobalamin (VITAMIN B 12 PO) Take by mouth.   Yes [provider]  Dapagliflozin Pro-metFORMIN ER (XIGDUO XR) 10-500 MG TB24 Take 1 tablet by mouth daily. 06/25/22  Yes   Dapagliflozin-metFORMIN HCl (XIGDUO XR PO) Take by mouth.   Yes [provider]  Eluxadoline (VIBERZI) 100 MG TABS Viberzi 100 mg tablet   Yes [provider]  fenofibrate (TRICOR) 145 MG tablet Take 1 tablet (145 mg total) by mouth daily. 04/10/21  Yes  glucose blood (ACCU-CHEK GUIDE) test strip Use as directed to test blood sugar 3 times daily 02/14/22  Yes   HUMALOG KWIKPEN 100 UNIT/ML KwikPen SMARTSIG:Unit(s) SUB-Q 01/31/20  Yes [provider]  insulin degludec (TRESIBA FLEXTOUCH) 200 UNIT/ML FlexTouch Pen Inject 24 Units into the skin daily. 02/11/22  Yes   insulin lispro (HUMALOG KWIKPEN) 100 UNIT/ML KwikPen Inject 10-15 Units into the skin 3 (three) times daily before meals plus  correctional. Max up to 60 units daily. 08/06/22  Yes   Lifitegrast (XIIDRA) 5 % SOLN Place 1 drop into both eyes 2 (two) times daily. 05/15/22  Yes   Magnesium 250 MG TABS Take 1 tablet by mouth.   Yes [provider]  omeprazole (PRILOSEC) 20 MG capsule Take 1 capsule (20 mg total) by mouth 2 (two) times daily. 11/29/21  Yes   VIBERZI 100 MG TABS Take 1 tablet (100 mg total) by mouth 2 (two) times daily. 09/18/22  Yes Juanita Craver, MD  albuterol (PROAIR HFA) 108 (90 Base) MCG/ACT inhaler Inhale 1 puff into the lungs every 4 (four) hours as needed. Patient not taking: Reported on 09/24/2022 01/31/22     albuterol (PROVENTIL HFA;VENTOLIN HFA) 108 (90 BASE) MCG/ACT inhaler Inhale into the lungs every 6 (six) hours as needed for wheezing or shortness of breath. Patient not taking: Reported on 09/24/2022    [provider]  amoxicillin-clavulanate (AUGMENTIN) 875-125 MG tablet Take 1 tablet by mouth every 12 (twelve) hours for 7 days. Patient not taking: Reported on 09/24/2022 10/02/21     Azilsartan-Chlorthalidone 40-25 MG TABS TAKE 1 TABLET BY MOUTH ONCE DAILY 11/29/19 11/28/20  Deland Pretty, MD  Calcium Carbonate-Vitamin D 500-125 MG-UNIT TABS Take by mouth. Patient not taking: Reported on 09/24/2022    [provider]  ciclesonide (ALVESCO) 160 MCG/ACT inhaler 1 inhalation 2 times per day with spacer Patient not taking: Reported on 09/24/2022 02/15/20   Jiles Prows, MD  Continuous Blood Gluc Sensor (DEXCOM G7 SENSOR) MISC Use 1 sensor on the skin every 10 days 11/27/21     COVID-19 At Home Antigen Test [facility] COVID-19 HOME TEST) KIT Use as directed Patient not taking: Reported on 09/24/2022 05/08/21   Jefm Bryant, RPH  dapagliflozin propanediol (FARXIGA) 10 MG TABS tablet Take 10 mg by mouth daily. Patient not taking: Reported on 09/24/2022    [provider]  Dapagliflozin-metFORMIN HCl ER (XIGDUO XR) 10-500 MG TB24 Take 1 tablet by mouth daily. 01/29/21      Dapagliflozin-metFORMIN HCl ER (XIGDUO XR) 10-500 MG TB24 Take 1 tablet by mouth daily. 11/14/21     Dapagliflozin-metFORMIN HCl ER (XIGDUO XR) 10-500 MG TB24 Take 1 tablet by mouth daily. 11/15/21     Eluxadoline (VIBERZI) 100 MG TABS Take 1 tablet (100 mg total) by mouth 2 (two) times daily. 04/20/21   Juanita Craver, MD  Eluxadoline (VIBERZI) 100 MG TABS Take 1 tablet (100 mg total) by mouth 2 (two) times daily. 05/06/22   Juanita Craver, MD  Eluxadoline (VIBERZI) 100 MG TABS Take 1 tablet (100 mg total) by mouth 2 (two) times daily. 06/11/22   Juanita Craver, MD  fenofibrate (TRICOR) 145 MG tablet TAKE 1 TABLET BY MOUTH ONCE DAILY. 09/18/20 09/18/21  Deland Pretty, MD  fenofibrate (TRICOR) 145 MG tablet Take 1 tablet (145 mg total) by mouth daily. 07/22/22     fluticasone furoate-vilanterol (BREO ELLIPTA) 100-25 MCG/ACT AEPB Inhale 1 puff into the lungs daily. Patient not taking: Reported on 09/24/2022 07/02/22  hyoscyamine (LEVBID) 0.375 MG 12 hr tablet TAKE 1 TABLET BY MOUTH EVERY 12 HOURS AS NEEDED FOR SYMPTOMS 03/30/20 03/30/21  Juanita Craver, MD  hyoscyamine (LEVBID) 0.375 MG 12 hr tablet Take 1 tablet (0.375 mg total) by mouth every 12 (twelve) hours as needed for symptoms 06/03/22     Insulin Degludec (TRESIBA [place]) Inject into the skin. Patient not taking: Reported on 09/24/2022    [provider]  insulin lispro (HUMALOG KWIKPEN) 100 UNIT/ML KwikPen Inject 10-15 Units into the skin 3 (three) times daily before meals plus correctional. Max up to 60 units daily 01/23/22     insulin lispro (HUMALOG) 100 UNIT/ML KwikPen INJECT 15 UNITS INTO THE SKIN THREE TIMES DAILY BEFORE MEALS PLUS CORRECTIONAL UP TO 60 UNITS PER DAY. 08/30/20 08/30/21  Averneni, Larna Daughters, MD  insulin lispro (HUMALOG) 100 UNIT/ML KwikPen INJECT 15 UNITS UNDER THE SKIN THREE TIMES DAILY BEFORE MEALS PLUS CORRECTIONAL UP TO 60 UNITS PER DAY 01/04/20 01/03/21  Madelin Rear, MD  Insulin Pen Needle 31G X 5 MM MISC Use as directed 3 to 4  times daily with insulin. Patient not taking: Reported on 09/24/2022 04/04/21   Madelin Rear, MD  Insulin Pen Needle 31G X 5 MM MISC Use as directed 3 to 4 times daily with insulin Patient not taking: Reported on 09/24/2022 05/13/22     ipratropium (ATROVENT) 0.06 % nasal spray Place 2 sprays into both nostrils 3 (three) times daily for 14 days. 01/04/21 01/29/21    levothyroxine (SYNTHROID) 25 MCG tablet Take 1 tablet (25 mcg total) by mouth every morning on an empty stomach Patient not taking: Reported on 10/30/2022 08/07/22     Levothyroxine Sodium (TIROSINT) 25 MCG CAPS Take 1 capsule (25 mcg total) by mouth in the morning on an empty stomach. Patient not taking: Reported on 09/24/2022 09/16/22     Loteprednol Etabonate (LOTEMAX SM) 0.38 % GEL Place 1 drop into both eyes 3 (three) times daily. Patient not taking: Reported on 09/24/2022 04/23/22     methocarbamol (ROBAXIN) 500 MG tablet Take 1 tablet (500 mg total) by mouth 4 (four) times daily. Patient not taking: Reported on 09/24/2022 06/26/21   Magnant, Gerrianne Scale, PA-C  nitrofurantoin, macrocrystal-monohydrate, (MACROBID) 100 MG capsule Take 1 capsule (100 mg total) by mouth 2 (two) times daily with food Patient not taking: Reported on 09/24/2022 05/07/22     omeprazole (PRILOSEC) 40 MG capsule Take 1 capsule (40 mg total) by mouth daily. Patient not taking: Reported on 10/30/2022 11/15/20   Deland Pretty, MD  ondansetron (ZOFRAN) 4 MG tablet Take 1 tablet (4 mg total) by mouth every 8 (eight) hours as needed for nausea or vomiting. Patient not taking: Reported on 09/24/2022 05/17/21   Meredith Pel, MD  oxyCODONE (OXY IR/ROXICODONE) 5 MG immediate release tablet Take 1 tablet (5 mg total) by mouth every 4 (four) hours as needed. Patient not taking: Reported on 09/24/2022 05/17/21   Meredith Pel, MD  predniSONE (DELTASONE) 20 MG tablet Take 2 tablets (40 mg total) by mouth daily. Patient not taking: Reported on 09/24/2022 07/02/22     Probiotic  Product (PROBIOTIC PO) Take 1 capsule by mouth. Patient not taking: Reported on 09/24/2022    [provider]  Semaglutide (OZEMPIC, 1 MG/DOSE, [place]) Inject into the skin. Patient not taking: Reported on 10/30/2022    [provider]  Semaglutide, 1 MG/DOSE, (OZEMPIC, 1 MG/DOSE,) 4 MG/3ML SOPN Inject 1 mg into the skin once a week. Patient not taking: Reported  on 09/24/2022 01/30/21     Semaglutide, 1 MG/DOSE, (OZEMPIC, 1 MG/DOSE,) 4 MG/3ML SOPN Inject 1 mg into the skin once a week. Patient not taking: Reported on 09/24/2022 09/13/21     Semaglutide, 2 MG/DOSE, (OZEMPIC, 2 MG/DOSE,) 8 MG/3ML SOPN Inject 2 mg into the skin once a week. Patient not taking: Reported on 09/24/2022 04/23/22     tirzepatide [facility]) 5 MG/0.5ML Pen Inject 5 mg into the skin once a week. Patient not taking: Reported on 10/30/2022 05/21/22     tirzepatide (MOUNJARO) 7.5 MG/0.5ML Pen Inject 7.5 mg into the skin once a week. 06/25/22     Triamcinolone Acetonide (NASACORT ALLERGY 24HR NA) Place into the nose.    [provider]    Current Outpatient Medications  Medication Sig Dispense Refill   aspirin 81 MG tablet Take 81 mg by mouth daily.     Azilsartan-Chlorthalidone (EDARBYCLOR) 40-25 MG TABS Take 1 tablet by mouth daily. 90 tablet 2   Blood Glucose Monitoring Suppl (FREESTYLE LITE) w/Device KIT use meter to test blood sugar 3 times daily 1 kit 0   cetirizine (ZYRTEC) 10 MG tablet Take 10 mg by mouth daily.     cholecalciferol (VITAMIN D) 1000 UNITS tablet Take 2,000 Units by mouth daily.      Continuous Blood Gluc Receiver (DEXCOM G6 RECEIVER) DEVI Use as directed 1 each 0   Continuous Blood Gluc Sensor (DEXCOM G6 SENSOR) MISC Change every 10 days as directed 3 each 11   Continuous Blood Gluc Sensor (FREESTYLE LIBRE 14 DAY SENSOR) MISC Use as directed change every 14 days 2 each 6   Continuous Blood Gluc Transmit (DEXCOM G6 TRANSMITTER) MISC Use as directed every 90 days 1 each 3   Cyanocobalamin  (VITAMIN B 12 PO) Take by mouth.     Dapagliflozin Pro-metFORMIN ER (XIGDUO XR) 10-500 MG TB24 Take 1 tablet by mouth daily. 30 tablet 5   Dapagliflozin-metFORMIN HCl (XIGDUO XR PO) Take by mouth.     Eluxadoline (VIBERZI) 100 MG TABS Viberzi 100 mg tablet     fenofibrate (TRICOR) 145 MG tablet Take 1 tablet (145 mg total) by mouth daily. 90 tablet 3   glucose blood (ACCU-CHEK GUIDE) test strip Use as directed to test blood sugar 3 times daily 300 each 3   HUMALOG KWIKPEN 100 UNIT/ML KwikPen SMARTSIG:Unit(s) SUB-Q     insulin degludec (TRESIBA FLEXTOUCH) 200 UNIT/ML FlexTouch Pen Inject 24 Units into the skin daily. 18 mL 5   insulin lispro (HUMALOG KWIKPEN) 100 UNIT/ML KwikPen Inject 10-15 Units into the skin 3 (three) times daily before meals plus correctional. Max up to 60 units daily. 18 mL 3   Lifitegrast (XIIDRA) 5 % SOLN Place 1 drop into both eyes 2 (two) times daily. 180 each 3   Magnesium 250 MG TABS Take 1 tablet by mouth.     omeprazole (PRILOSEC) 20 MG capsule Take 1 capsule (20 mg total) by mouth 2 (two) times daily. 180 capsule 3   VIBERZI 100 MG TABS Take 1 tablet (100 mg total) by mouth 2 (two) times daily. 180 tablet 3   albuterol (PROAIR HFA) 108 (90 Base) MCG/ACT inhaler Inhale 1 puff into the lungs every 4 (four) hours as needed. (Patient not taking: Reported on 09/24/2022) 18 g 2   albuterol (PROVENTIL HFA;VENTOLIN HFA) 108 (90 BASE) MCG/ACT inhaler Inhale into the lungs every 6 (six) hours as needed for wheezing or shortness of breath. (Patient not taking: Reported on 09/24/2022)  amoxicillin-clavulanate (AUGMENTIN) 875-125 MG tablet Take 1 tablet by mouth every 12 (twelve) hours for 7 days. (Patient not taking: Reported on 09/24/2022) 14 tablet 0   Azilsartan-Chlorthalidone 40-25 MG TABS TAKE 1 TABLET BY MOUTH ONCE DAILY 30 tablet 9   Calcium Carbonate-Vitamin D 500-125 MG-UNIT TABS Take by mouth. (Patient not taking: Reported on 09/24/2022)     ciclesonide (ALVESCO) 160  MCG/ACT inhaler 1 inhalation 2 times per day with spacer (Patient not taking: Reported on 09/24/2022) 6.1 g 5   Continuous Blood Gluc Sensor (DEXCOM G7 SENSOR) MISC Use 1 sensor on the skin every 10 days 3 each 6   COVID-19 At Home Antigen Test ([facility] COVID-19 HOME TEST) KIT Use as directed (Patient not taking: Reported on 09/24/2022) 4 each 0   dapagliflozin propanediol (FARXIGA) 10 MG TABS tablet Take 10 mg by mouth daily. (Patient not taking: Reported on 09/24/2022)     Dapagliflozin-metFORMIN HCl ER (XIGDUO XR) 10-500 MG TB24 Take 1 tablet by mouth daily. 90 tablet 2   Dapagliflozin-metFORMIN HCl ER (XIGDUO XR) 10-500 MG TB24 Take 1 tablet by mouth daily. 30 tablet 11   Dapagliflozin-metFORMIN HCl ER (XIGDUO XR) 10-500 MG TB24 Take 1 tablet by mouth daily. 90 tablet 1   Eluxadoline (VIBERZI) 100 MG TABS Take 1 tablet (100 mg total) by mouth 2 (two) times daily. 60 tablet 5   Eluxadoline (VIBERZI) 100 MG TABS Take 1 tablet (100 mg total) by mouth 2 (two) times daily. 60 tablet 1   Eluxadoline (VIBERZI) 100 MG TABS Take 1 tablet (100 mg total) by mouth 2 (two) times daily. 180 tablet 3   fenofibrate (TRICOR) 145 MG tablet TAKE 1 TABLET BY MOUTH ONCE DAILY. 90 tablet 1   fenofibrate (TRICOR) 145 MG tablet Take 1 tablet (145 mg total) by mouth daily. 90 tablet 1   fluticasone furoate-vilanterol (BREO ELLIPTA) 100-25 MCG/ACT AEPB Inhale 1 puff into the lungs daily. (Patient not taking: Reported on 09/24/2022) 60 each 0   hyoscyamine (LEVBID) 0.375 MG 12 hr tablet TAKE 1 TABLET BY MOUTH EVERY 12 HOURS AS NEEDED FOR SYMPTOMS 60 tablet 12   hyoscyamine (LEVBID) 0.375 MG 12 hr tablet Take 1 tablet (0.375 mg total) by mouth every 12 (twelve) hours as needed for symptoms 60 tablet 5   Insulin Degludec (TRESIBA [place]) Inject into the skin. (Patient not taking: Reported on 09/24/2022)     insulin lispro (HUMALOG KWIKPEN) 100 UNIT/ML KwikPen Inject 10-15 Units into the skin 3 (three) times daily before meals plus  correctional. Max up to 60 units daily 18 mL 3   insulin lispro (HUMALOG) 100 UNIT/ML KwikPen INJECT 15 UNITS INTO THE SKIN THREE TIMES DAILY BEFORE MEALS PLUS CORRECTIONAL UP TO 60 UNITS PER DAY. 18 mL 3   insulin lispro (HUMALOG) 100 UNIT/ML KwikPen INJECT 15 UNITS UNDER THE SKIN THREE TIMES DAILY BEFORE MEALS PLUS CORRECTIONAL UP TO 60 UNITS PER DAY 18 mL 3   Insulin Pen Needle 31G X 5 MM MISC Use as directed 3 to 4 times daily with insulin. (Patient not taking: Reported on 09/24/2022) 400 each 3   Insulin Pen Needle 31G X 5 MM MISC Use as directed 3 to 4 times daily with insulin (Patient not taking: Reported on 09/24/2022) 400 each 3   ipratropium (ATROVENT) 0.06 % nasal spray Place 2 sprays into both nostrils 3 (three) times daily for 14 days. 15 mL 2   levothyroxine (SYNTHROID) 25 MCG tablet Take 1 tablet (25 mcg total) by mouth every  morning on an empty stomach (Patient not taking: Reported on 10/30/2022) 30 tablet 1   Levothyroxine Sodium (TIROSINT) 25 MCG CAPS Take 1 capsule (25 mcg total) by mouth in the morning on an empty stomach. (Patient not taking: Reported on 09/24/2022) 30 capsule 1   Loteprednol Etabonate (LOTEMAX SM) 0.38 % GEL Place 1 drop into both eyes 3 (three) times daily. (Patient not taking: Reported on 09/24/2022) 5 g 0   methocarbamol (ROBAXIN) 500 MG tablet Take 1 tablet (500 mg total) by mouth 4 (four) times daily. (Patient not taking: Reported on 09/24/2022) 30 tablet 0   nitrofurantoin, macrocrystal-monohydrate, (MACROBID) 100 MG capsule Take 1 capsule (100 mg total) by mouth 2 (two) times daily with food (Patient not taking: Reported on 09/24/2022) 14 capsule 0   omeprazole (PRILOSEC) 40 MG capsule Take 1 capsule (40 mg total) by mouth daily. (Patient not taking: Reported on 10/30/2022) 90 capsule 3   ondansetron (ZOFRAN) 4 MG tablet Take 1 tablet (4 mg total) by mouth every 8 (eight) hours as needed for nausea or vomiting. (Patient not taking: Reported on 09/24/2022) 20 tablet 0    oxyCODONE (OXY IR/ROXICODONE) 5 MG immediate release tablet Take 1 tablet (5 mg total) by mouth every 4 (four) hours as needed. (Patient not taking: Reported on 09/24/2022) 30 tablet 0   predniSONE (DELTASONE) 20 MG tablet Take 2 tablets (40 mg total) by mouth daily. (Patient not taking: Reported on 09/24/2022) 10 tablet 0   Probiotic Product (PROBIOTIC PO) Take 1 capsule by mouth. (Patient not taking: Reported on 09/24/2022)     Semaglutide (OZEMPIC, 1 MG/DOSE, [place]) Inject into the skin. (Patient not taking: Reported on 10/30/2022)     Semaglutide, 1 MG/DOSE, (OZEMPIC, 1 MG/DOSE,) 4 MG/3ML SOPN Inject 1 mg into the skin once a week. (Patient not taking: Reported on 09/24/2022) 3 mL 5   Semaglutide, 1 MG/DOSE, (OZEMPIC, 1 MG/DOSE,) 4 MG/3ML SOPN Inject 1 mg into the skin once a week. (Patient not taking: Reported on 09/24/2022) 3 mL 3   Semaglutide, 2 MG/DOSE, (OZEMPIC, 2 MG/DOSE,) 8 MG/3ML SOPN Inject 2 mg into the skin once a week. (Patient not taking: Reported on 09/24/2022) 9 mL 0   tirzepatide (MOUNJARO) 5 MG/0.5ML Pen Inject 5 mg into the skin once a week. (Patient not taking: Reported on 10/30/2022) 2 mL 1   tirzepatide (MOUNJARO) 7.5 MG/0.5ML Pen Inject 7.5 mg into the skin once a week. 2 mL 6   Triamcinolone Acetonide (NASACORT ALLERGY 24HR NA) Place into the nose.     Current Facility-Administered Medications  Medication Dose Route Frequency Provider Last Rate Last Admin   0.9 %  sodium chloride infusion  500 mL Intravenous Once Oneika Simonian V, DO        Allergies as of 10/30/2022   (No Known Allergies)    Family History  Problem Relation Age of Onset   Hypertension Father    Hypertension Sister    Diabetes Sister    Hypertension Brother    Diabetes Brother    Hypertension Brother    Crohn's disease Brother    Colon cancer Neg Hx    Colon polyps Neg Hx    Esophageal cancer Neg Hx    Rectal cancer Neg Hx    Stomach cancer Neg Hx     Social History   Socioeconomic History    Marital status: Divorced    Spouse name: Not on file   Number of children: Not on file   Years of education:  Not on file   Highest education level: Not on file  Occupational History   Occupation: Insurance  Tobacco Use   Smoking status: Never   Smokeless tobacco: Never  Substance and Sexual Activity   Alcohol use: No    Alcohol/week: 0.0 standard drinks of alcohol   Drug use: No   Sexual activity: Not on file  Other Topics Concern   Not on file  Social History Narrative   Not on file   Social Determinants of Health   Financial Resource Strain: Not on file  Food Insecurity: Not on file  Transportation Needs: Not on file  Physical Activity: Not on file  Stress: Not on file  Social Connections: Not on file  Intimate Partner Violence: Not on file    Physical Exam: Vital signs in last 24 hours: '@Temp'$  (!) 97.3 F (36.3 C)  GEN: NAD EYE: Sclerae anicteric ENT: MMM CV: Non-tachycardic Pulm: CTA b/l GI: Soft, NT/ND NEURO:  Alert & Oriented x [place], DO [facility]   10/30/2022 3:00 PM

## 2022-10-30 NOTE — Progress Notes (Signed)
Called to room to assist during endoscopic procedure.  Patient ID and intended procedure confirmed with present staff. Received instructions for my participation in the procedure from the performing physician.  

## 2022-10-30 NOTE — Progress Notes (Signed)
Pt's states no medical or surgical changes since previsit or office visit. 

## 2022-10-30 NOTE — Patient Instructions (Signed)
Handout on polyps given.      YOU HAD AN ENDOSCOPIC PROCEDURE TODAY AT [facility]:   Refer to the procedure report that was given to you for any specific questions about what was found during the examination.  If the procedure report does not answer your questions, please call your gastroenterologist to clarify.  If you requested that your care partner not be given the details of your procedure findings, then the procedure report has been included in a sealed envelope for you to review at your convenience later.  YOU SHOULD EXPECT: Some feelings of bloating in the abdomen. Passage of more gas than usual.  Walking can help get rid of the air that was put into your GI tract during the procedure and reduce the bloating. If you had a lower endoscopy (such as a colonoscopy or flexible sigmoidoscopy) you may notice spotting of blood in your stool or on the toilet paper. If you underwent a bowel prep for your procedure, you may not have a normal bowel movement for a few days.  Please Note:  You might notice some irritation and congestion in your nose or some drainage.  This is from the oxygen used during your procedure.  There is no need for concern and it should clear up in a day or so.  SYMPTOMS TO REPORT IMMEDIATELY:  Following lower endoscopy (colonoscopy or flexible sigmoidoscopy):  Excessive amounts of blood in the stool  Significant tenderness or worsening of abdominal pains  Swelling of the abdomen that is new, acute  Fever of 100F or higher   For urgent or emergent issues, a gastroenterologist can be reached at any hour by calling [phone]. Do not use MyChart messaging for urgent concerns.    DIET:  We do recommend a small meal at first, but then you may proceed to your regular diet.  Drink plenty of fluids but you should avoid alcoholic beverages for 24 hours.  ACTIVITY:  You should plan to take it easy for the rest of today and you should NOT DRIVE or use heavy  machinery until tomorrow (because of the sedation medicines used during the test).    FOLLOW UP: Our staff will call the number listed on your records the next business day following your procedure.  We will call around 7:15- 8:00 am to check on you and address any questions or concerns that you may have regarding the information given to you following your procedure. If we do not reach you, we will leave a message.     If any biopsies were taken you will be contacted by phone or by letter within the next 1-3 weeks.  Please call us at [phone] if you have not heard about the biopsies in 3 weeks.    SIGNATURES/CONFIDENTIALITY: You and/or your care partner have signed paperwork which will be entered into your electronic medical record.  These signatures attest to the fact that that the information above on your After Visit Summary has been reviewed and is understood.  Full responsibility of the confidentiality of this discharge information lies with you and/or your care-partner.

## 2022-10-30 NOTE — Op Note (Signed)
[place] Patient Name: Heather Stout Procedure Date: 10/30/2022 3:02 PM MRN: [id number] Endoscopist: Gerrit Heck , MD, [id number] Age: 67 Referring MD:  Date of Birth: 03-13-1956 Gender: Female Account #: [id number] Procedure:                Colonoscopy Indications:              Screening for colorectal malignant neoplasm (last                            colonoscopy was 10 years ago) Medicines:                Monitored Anesthesia Care Procedure:                Pre-Anesthesia Assessment:                           - Prior to the procedure, a History and Physical                            was performed, and patient medications and                            allergies were reviewed. The patient's tolerance of                            previous anesthesia was also reviewed. The risks                            and benefits of the procedure and the sedation                            options and risks were discussed with the patient.                            All questions were answered, and informed consent                            was obtained. Prior Anticoagulants: The patient has                            taken no anticoagulant or antiplatelet agents. ASA                            Grade Assessment: II - A patient with mild systemic                            disease. After reviewing the risks and benefits,                            the patient was deemed in satisfactory condition to                            undergo the procedure.  After obtaining informed consent, the colonoscope                            was passed under direct vision. Throughout the                            procedure, the patient's blood pressure, pulse, and                            oxygen saturations were monitored continuously. The                            Olympus [id number]) Colonoscope was                            introduced through the anus and  advanced to the the                            cecum, identified by appendiceal orifice and                            ileocecal valve. The colonoscopy was technically                            difficult and complex due to a tortuous colon. The                            patient tolerated the procedure well. The quality                            of the bowel preparation was good. The ileocecal                            valve, appendiceal orifice, and rectum were                            photographed. Scope In: 3:09:27 PM Scope Out: 3:37:16 PM Scope Withdrawal Time: 0 hours 18 minutes 49 seconds  Total Procedure Duration: 0 hours 27 minutes 49 seconds  Findings:                 The perianal and digital rectal examinations were                            normal.                           Four sessile polyps were found in the ascending                            colon (3) and cecum. The polyps were 4 to 6 mm in                            size. These polyps were removed with a cold snare.  Resection and retrieval were complete. Estimated                            blood loss was minimal.                           Two mucous-capped and sessile polyps were found in                            the transverse colon. The polyps were 4 to 8 mm in                            size. Estimated blood loss was minimal.                           A few medium-mouthed and small-mouthed diverticula                            were found in the sigmoid colon.                           The sigmoid colon was significantly tortuous.                            Advancing the scope required using manual pressure.                           Retroflexion in the rectum was not performed due to                            anatomy (narrow, short rectal vault). Complications:            No immediate complications. Estimated Blood Loss:     Estimated blood loss was minimal. Impression:                - Four 4 to 6 mm polyps in the ascending colon and                            in the cecum, removed with a cold snare. Resected                            and retrieved.                           - Two 4 to 8 mm polyps in the transverse colon.                           - Diverticulosis in the sigmoid colon.                           - Tortuous colon. Recommendation:           - Patient has a contact number available for  emergencies. The signs and symptoms of potential                            delayed complications were discussed with the                            patient. Return to normal activities tomorrow.                            Written discharge instructions were provided to the                            patient.                           - Resume previous diet.                           - Continue present medications.                           - Await pathology results.                           - Repeat colonoscopy for surveillance based on                            pathology results.                           - Return to GI office PRN. Gerrit Heck, MD 10/30/2022 3:45:01 PM

## 2022-10-31 ENCOUNTER — Telehealth: Payer: Self-pay | Admitting: *Deleted

## 2022-10-31 ENCOUNTER — Telehealth: Payer: Self-pay

## 2022-10-31 NOTE — Telephone Encounter (Signed)
Called and spoke with patient. Pt has been scheduled for a follow up appt with Vicie Mutters, PA-C on Monday, 12/23/22 at 3 PM. Pt is aware that her appt information will be available in MyChart. Pt verbalized understanding and had no concerns at the end of the call.

## 2022-10-31 NOTE — Telephone Encounter (Signed)
  Follow up Call-     10/30/2022    2:10 PM  Call back number  Post procedure Call Back phone  # [phone]  Permission to leave phone message Yes     Patient questions:  Do you have a fever, pain , or abdominal swelling? No. Pain Score  0 *  Have you tolerated food without any problems? Yes.    Have you been able to return to your normal activities? Yes.    Do you have any questions about your discharge instructions: Diet   No. Medications  No. Follow up visit  No.  Do you have questions or concerns about your Care? Yes.   -pt c/o fever of 101 and 2 episodes of dark diarrhea after procedure yesterday. States she thinks the diarrhea has resolved. This morning she c/o a sore throat. States she was recently exposed to someone with a cold. Encouraged pt to continue to monitor her BMs and if the diarrhea continues or blood is noted in the stool, she should call back. Recommended if fever or cold symptoms persist she should f/u with her PCP. Pt verbalized understanding.   Actions: * If pain score is 4 or above: No action needed, pain <4.

## 2022-10-31 NOTE — Telephone Encounter (Signed)
-----   Message from [place], DO sent at 10/30/2022  5:12 PM EST ----- Patient needs OV to establish care for IBS-D. Colonoscopy was completed today as direct access. Can schedule w/ me or any APP. Thanks.

## 2022-11-01 ENCOUNTER — Other Ambulatory Visit (HOSPITAL_COMMUNITY): Payer: Self-pay

## 2022-11-04 ENCOUNTER — Other Ambulatory Visit (HOSPITAL_COMMUNITY): Payer: Self-pay

## 2022-11-06 ENCOUNTER — Encounter: Payer: Self-pay | Admitting: Gastroenterology

## 2022-11-25 ENCOUNTER — Other Ambulatory Visit (HOSPITAL_COMMUNITY): Payer: Self-pay

## 2022-11-26 ENCOUNTER — Other Ambulatory Visit: Payer: Self-pay

## 2022-11-26 ENCOUNTER — Other Ambulatory Visit (HOSPITAL_COMMUNITY): Payer: Self-pay

## 2022-11-26 MED ORDER — EDARBYCLOR 40-25 MG PO TABS
1.0000 | ORAL_TABLET | Freq: Every day | ORAL | 2 refills | Status: DC
  Filled 2022-11-26: qty 30, 30d supply, fill #0
  Filled 2023-01-13: qty 90, 90d supply, fill #0
  Filled 2023-03-05 – 2023-04-07 (×2): qty 90, 90d supply, fill #1
  Filled 2023-07-06: qty 90, 90d supply, fill #2

## 2022-11-28 ENCOUNTER — Other Ambulatory Visit (HOSPITAL_COMMUNITY): Payer: Self-pay

## 2022-11-29 ENCOUNTER — Other Ambulatory Visit (HOSPITAL_COMMUNITY): Payer: Self-pay

## 2022-11-29 MED ORDER — AZILSARTAN-CHLORTHALIDONE 40-25 MG PO TABS
1.0000 | ORAL_TABLET | Freq: Every day | ORAL | 0 refills | Status: DC
  Filled 2022-11-29: qty 30, 30d supply, fill #0

## 2022-12-04 ENCOUNTER — Other Ambulatory Visit (HOSPITAL_COMMUNITY): Payer: Self-pay

## 2022-12-05 ENCOUNTER — Other Ambulatory Visit (HOSPITAL_COMMUNITY): Payer: Self-pay

## 2022-12-09 ENCOUNTER — Other Ambulatory Visit: Payer: Self-pay

## 2022-12-09 ENCOUNTER — Other Ambulatory Visit (HOSPITAL_COMMUNITY): Payer: Self-pay

## 2022-12-10 ENCOUNTER — Other Ambulatory Visit (HOSPITAL_COMMUNITY): Payer: Self-pay

## 2022-12-11 ENCOUNTER — Other Ambulatory Visit (HOSPITAL_COMMUNITY): Payer: Self-pay

## 2022-12-19 ENCOUNTER — Other Ambulatory Visit (HOSPITAL_COMMUNITY): Payer: Self-pay

## 2022-12-20 NOTE — Progress Notes (Addendum)
12/23/2022 Heather Stout [id number] [birth year]  Referring provider: Merri [name], MD Primary GI doctor: Dr. Barron Alvine  ASSESSMENT AND PLAN:   Irritable bowel syndrome with diarrhea Given FODMAP diet Refilled Viberzi 100 mg twice daily and hyoscyamine Patient's had worsening gas and bloating, will do trial of Xifaxan 2 weeks for IBS-D, may be able to taper down or off of Viberzi pending results. Consider pancreatic elastase/celiac if not previously done due to patient's diabetes and family history of autoimmune  Addendum: Patient has had considerable benefit from the Viberzi 100mg  BID and states it has been life changing with decreased AB pain and more formed stools allowing her to be able to do more. She has had some bloating and gas but the diarrhea and AB pain had improved.   Diverticulosis of colon without hemorrhage Will call if any symptoms. Add on fiber supplement, avoid NSAIDS, information given  History of adenomatous polyp of colon Recall 10/29/2025  Type 2 diabetes mellitus without complication, with long-term current use of insulin (HCC) On insulin, GLP-1 Consider pancreatic elastase    Patient Care Team: Merri [name], MD as PCP - General (Internal Medicine)  HISTORY OF PRESENT ILLNESS: 67 y.o. female with a past medical history of hypertension, OSA, hypothyroidism, type 2 diabetes, GERD, IBS-D and others listed below presents for evaluation of IBS-D.   10/30/2022 colonoscopy [facility] for screening colonoscopy for polyps 4 to 6 mm size 2 polyps 4-8 mm transverse colon, diverticulosis, tortuous colon 1 polyp was hyperplastic but the other polyps were tubular adenomatous and 1 sessile serrated recall 3 years  She states she felt very poor from the prep, felt she ran a fever and then got a cold.  Was not super interested in having another colonoscopy but understands she will need another one 3 years.  May need to come in for an office visit to discuss  prep.  She has IBS-D and has been on viberzi for years, prior to that she had fecal incontinence, explosive diarrhea. First evaluated with Dr. Loreta Ave.  Since the colonoscopy she has had worsening gas, a lot of bloating, looser stool but still only once daily. She states Saturday she had a salad and Sunday morning she had loose stools more than normal.  Her daughter has colitis and pancreatitis, and brother and father with crohn's.  Never did trial of xifaxin per patient.  A1C is now 6.5   She denies blood thinner use.  She denies NSAID use.  She denies ETOH use.   She denies tobacco use.  She denies drug use.    She  reports that she has never smoked. She has never used smokeless tobacco. She reports that she does not drink alcohol and does not use drugs.  RELEVANT LABS AND IMAGING: CBC    Component Value Date/Time   WBC 5.6 05/18/2021 1508   RBC 5.12 (H) 05/18/2021 1508   HGB 13.5 05/18/2021 1508   HGB 14.4 03/21/2020 1706   HCT 43.2 05/18/2021 1508   HCT 45.7 03/21/2020 1706   PLT 203 05/18/2021 1508   MCV 84.4 05/18/2021 1508   MCV 83 03/21/2020 1706   MCH 26.4 05/18/2021 1508   MCHC 31.3 05/18/2021 1508   RDW 13.5 05/18/2021 1508   RDW 13.1 03/21/2020 1706   LYMPHSABS 1.4 03/21/2020 1706   EOSABS 0.2 03/21/2020 1706   BASOSABS 0.1 03/21/2020 1706   No results for input(s): "HGB" in the last 8760 hours.  CMP     Component Value Date/Time  NA 136 05/18/2021 1508   K 5.0 05/18/2021 1508   CL 101 05/18/2021 1508   CO2 26 05/18/2021 1508   GLUCOSE 221 (H) 05/18/2021 1508   BUN 16 05/18/2021 1508   CREATININE 1.11 (H) 05/18/2021 1508   CALCIUM 9.8 05/18/2021 1508   GFRNONAA 55 (L) 05/18/2021 1508   GFRAA >60 05/12/2020 1200       No data to display            Current Medications:   Current Outpatient Medications (Endocrine & Metabolic):    Dapagliflozin-metFORMIN HCl (XIGDUO XR PO), Take by mouth.   Dapagliflozin-metFORMIN HCl ER (XIGDUO XR) 10-500 MG  TB24, Take 1 tablet by mouth daily.   HUMALOG KWIKPEN 100 UNIT/ML KwikPen, SMARTSIG:Unit(s) SUB-Q   insulin degludec (TRESIBA FLEXTOUCH) 200 UNIT/ML FlexTouch Pen, Inject 24 Units into the skin daily.   insulin lispro (HUMALOG KWIKPEN) 100 UNIT/ML KwikPen, Inject 10-15 Units into the skin 3 (three) times daily before meals plus correctional. Max up to 60 units daily.   tirzepatide (MOUNJARO) 7.5 MG/0.5ML Pen, Inject 7.5 mg into the skin once a week.  Current Outpatient Medications (Cardiovascular):    Azilsartan-Chlorthalidone (EDARBYCLOR) 40-25 MG TABS, Take 1 tablet by mouth daily.   Azilsartan-Chlorthalidone 40-25 MG TABS, Take 1 tablet by mouth daily.   fenofibrate (TRICOR) 145 MG tablet, Take 1 tablet (145 mg total) by mouth daily.  Current Outpatient Medications (Respiratory):    albuterol (PROAIR HFA) 108 (90 Base) MCG/ACT inhaler, Inhale 1 puff into the lungs every 4 (four) hours as needed.   cetirizine (ZYRTEC) 10 MG tablet, Take 10 mg by mouth daily.   Triamcinolone Acetonide (NASACORT ALLERGY 24HR NA), Place into the nose.   albuterol (PROVENTIL HFA;VENTOLIN HFA) 108 (90 BASE) MCG/ACT inhaler, Inhale into the lungs every 6 (six) hours as needed for wheezing or shortness of breath. (Patient not taking: Reported on 09/24/2022)  Current Outpatient Medications (Analgesics):    aspirin 81 MG tablet, Take 81 mg by mouth daily.  Current Outpatient Medications (Hematological):    Cyanocobalamin (VITAMIN B 12 PO), Take by mouth.  Current Outpatient Medications (Other):    cholecalciferol (VITAMIN D) 1000 UNITS tablet, Take 2,000 Units by mouth daily.    Continuous Blood Gluc Sensor (DEXCOM G6 SENSOR) MISC, Change every 10 days as directed   Continuous Blood Gluc Sensor (FREESTYLE LIBRE 14 DAY SENSOR) MISC, Use as directed change every 14 days   Lifitegrast (XIIDRA) 5 % SOLN, Place 1 drop into both eyes 2 (two) times daily.   Magnesium 250 MG TABS, Take 1 tablet by mouth.   omeprazole  (PRILOSEC) 20 MG capsule, Take 1 capsule (20 mg total) by mouth 2 (two) times daily.   Probiotic Product (PROBIOTIC PO), Take 1 capsule by mouth.   rifaximin (XIFAXAN) 550 MG TABS tablet, Take 1 tablet (550 mg total) by mouth 3 (three) times daily for 14 days.   Calcium Carbonate-Vitamin D 500-125 MG-UNIT TABS, Take by mouth. (Patient not taking: Reported on 09/24/2022)   hyoscyamine (LEVBID) 0.375 MG 12 hr tablet, Take 1 tablet (0.375 mg total) by mouth every 12 (twelve) hours as needed for symptoms   VIBERZI 100 MG TABS, Take 1 tablet (100 mg total) by mouth 2 (two) times daily.  Medical History:  Past Medical History:  Diagnosis Date   Asthma    Essential hypertension, benign    GERD (gastroesophageal reflux disease)    IBS (irritable bowel syndrome)    Left anterior hemiblock    Other and  unspecified hyperlipidemia    Sleep apnea    cpap   Thyroid disease    Type II or unspecified type diabetes mellitus without mention of complication, not stated as uncontrolled    Allergies: No Known Allergies   Surgical History:  She  has a past surgical history that includes Lumbar laminectomy (08/26/2000); Lumbar laminectomy (08/27/2003); Tonsillectomy (08/26/1978); Functional endoscopic sinus surgery (08/26/1988); Shoulder arthroscopy with open rotator cuff repair (Right, 05/15/2020); Shoulder arthroscopy with open rotator cuff repair (Right, 05/21/2021); and Knee arthroscopy (Left, 2013). Family History:  Her family history includes Crohn's disease in her brother; Diabetes in her brother and sister; Hypertension in her brother, brother, father, and sister.  REVIEW OF SYSTEMS  : All other systems reviewed and negative except where noted in the History of Present Illness.  PHYSICAL EXAM: BP 116/64 (BP Location: Left Arm, Patient Position: Sitting, Cuff Size: Normal)   Pulse 72   Ht 5\' 7"  (1.702 m)   Wt 175 lb (79.4 kg)   SpO2 99%   BMI 27.41 kg/m  General Appearance: Well nourished, in no  apparent distress. Head:   Normocephalic and atraumatic. Eyes:  sclerae anicteric,conjunctive pink  Respiratory: Respiratory effort normal, BS equal bilaterally without rales, rhonchi, wheezing. Cardio: RRR with no MRGs. Peripheral pulses intact.  Abdomen: Soft,  Non-distended ,active bowel sounds. No tenderness . Without guarding and Without rebound. No masses. Rectal: Not evaluated Musculoskeletal: Full ROM, Normal gait. Without edema. Skin:  Dry and intact without significant lesions or rashes Neuro: Alert and  oriented x4;  No focal deficits. Psych:  Cooperative. Normal mood and affect.    Doree Albee, PA-C 4:05 PM

## 2022-12-23 ENCOUNTER — Ambulatory Visit: Payer: 59 | Admitting: Physician Assistant

## 2022-12-23 ENCOUNTER — Other Ambulatory Visit (HOSPITAL_COMMUNITY): Payer: Self-pay

## 2022-12-23 ENCOUNTER — Encounter: Payer: Self-pay | Admitting: Physician Assistant

## 2022-12-23 VITALS — BP 116/64 | HR 72 | Ht 67.0 in | Wt 175.0 lb

## 2022-12-23 DIAGNOSIS — Z794 Long term (current) use of insulin: Secondary | ICD-10-CM | POA: Diagnosis not present

## 2022-12-23 DIAGNOSIS — K5730 Diverticulosis of large intestine without perforation or abscess without bleeding: Secondary | ICD-10-CM

## 2022-12-23 DIAGNOSIS — Z86010 Personal history of colonic polyps: Secondary | ICD-10-CM

## 2022-12-23 DIAGNOSIS — E119 Type 2 diabetes mellitus without complications: Secondary | ICD-10-CM

## 2022-12-23 DIAGNOSIS — K580 Irritable bowel syndrome with diarrhea: Secondary | ICD-10-CM

## 2022-12-23 MED ORDER — VIBERZI 100 MG PO TABS
100.0000 mg | ORAL_TABLET | Freq: Two times a day (BID) | ORAL | 3 refills | Status: DC
Start: 2022-12-23 — End: 2023-07-06
  Filled 2022-12-23: qty 180, 90d supply, fill #0
  Filled 2023-03-24 – 2023-04-04 (×6): qty 180, 90d supply, fill #1

## 2022-12-23 MED ORDER — RIFAXIMIN 550 MG PO TABS
550.0000 mg | ORAL_TABLET | Freq: Three times a day (TID) | ORAL | 0 refills | Status: AC
  Filled 2022-12-23: qty 42, 14d supply, fill #0

## 2022-12-23 MED ORDER — HYOSCYAMINE SULFATE ER 0.375 MG PO TB12
0.3750 mg | ORAL_TABLET | Freq: Two times a day (BID) | ORAL | 5 refills | Status: DC | PRN
Start: 2022-12-23 — End: 2024-01-21
  Filled 2022-12-23: qty 60, 30d supply, fill #0
  Filled 2023-03-24 – 2023-03-27 (×2): qty 60, 30d supply, fill #1
  Filled 2023-06-09: qty 60, 30d supply, fill #2
  Filled 2023-08-03: qty 60, 30d supply, fill #3
  Filled 2023-09-22: qty 60, 30d supply, fill #4
  Filled 2023-11-06: qty 60, 30d supply, fill #5

## 2022-12-23 NOTE — Patient Instructions (Addendum)
Follow up as needed  We may want to evaluate you for small intestinal bacterial overgrowth,  this can cause increase gas, bloating, loose stools or constipation.  There is a test for this we can do or sometimes we will treat a patient with an antibiotic to see if it helps.   Probiotics are microorganisms which may be beneficial to the function of the gastrointestinal tract and are used in several different GI conditions including Irritable Bowel Syndrome and following infections involving the GI tract.  Two commonly used probiotics are:  VSL#3 (Bifidobacterium breve, B. longum, B. infantis, Lactobacillus acidophilus, L. plantarum, L. paracasei, L. bulgaricus, Streptococcus thermophilus) and Align (B. Infantis).  I recommend you take one of these two probiotics once daily for a month     FODMAP stands for fermentable oligo-, di-, mono-saccharides and polyols (1). These are the scientific terms used to classify groups of carbs that are notorious for triggering digestive symptoms like bloating, gas and stomach pain.     It was a pleasure to see you today!  Thank you for trusting me with your gastrointestinal care!

## 2022-12-24 ENCOUNTER — Other Ambulatory Visit (HOSPITAL_COMMUNITY): Payer: Self-pay

## 2022-12-24 DIAGNOSIS — K589 Irritable bowel syndrome without diarrhea: Secondary | ICD-10-CM | POA: Diagnosis not present

## 2022-12-24 DIAGNOSIS — N1831 Chronic kidney disease, stage 3a: Secondary | ICD-10-CM | POA: Diagnosis not present

## 2022-12-24 DIAGNOSIS — E1122 Type 2 diabetes mellitus with diabetic chronic kidney disease: Secondary | ICD-10-CM | POA: Diagnosis not present

## 2022-12-24 DIAGNOSIS — N1832 Chronic kidney disease, stage 3b: Secondary | ICD-10-CM | POA: Diagnosis not present

## 2022-12-24 DIAGNOSIS — I129 Hypertensive chronic kidney disease with stage 1 through stage 4 chronic kidney disease, or unspecified chronic kidney disease: Secondary | ICD-10-CM | POA: Diagnosis not present

## 2022-12-24 MED ORDER — FREESTYLE LIBRE 14 DAY SENSOR MISC
6 refills | Status: DC
  Filled 2022-12-24 (×2): qty 2, 28d supply, fill #0
  Filled 2023-01-27: qty 2, 28d supply, fill #1

## 2022-12-25 ENCOUNTER — Telehealth: Payer: Self-pay | Admitting: Pharmacy Technician

## 2022-12-25 ENCOUNTER — Other Ambulatory Visit (HOSPITAL_COMMUNITY): Payer: Self-pay

## 2022-12-25 NOTE — Telephone Encounter (Signed)
Patient Advocate Encounter  Received notification from [facility] that prior authorization for XIFAXAN 550MG  is required.   PA submitted on 5.1.24 Key [id number] Status is pending

## 2022-12-26 ENCOUNTER — Other Ambulatory Visit (HOSPITAL_COMMUNITY): Payer: Self-pay

## 2022-12-26 ENCOUNTER — Other Ambulatory Visit: Payer: Self-pay

## 2022-12-27 ENCOUNTER — Other Ambulatory Visit (HOSPITAL_COMMUNITY): Payer: Self-pay

## 2022-12-27 MED ORDER — CEFPODOXIME PROXETIL 100 MG PO TABS
100.0000 mg | ORAL_TABLET | Freq: Two times a day (BID) | ORAL | 0 refills | Status: AC
  Filled 2022-12-27: qty 10, 5d supply, fill #0

## 2022-12-27 NOTE — Telephone Encounter (Signed)
Pharmacy Patient Advocate Encounter  Received notification from [facility] that the request for prior authorization for XIFAXAN has been denied due to  the treatment of irritable bowel syndrome with diarrhea (IBS-D: condition of stomach pain with many periods of diarrhea), our guideline for RIFAXIMIN 550mg  (generic for Xifaxan) requires a trial of, or contraindication to (a medical reason not to take), a tricyclic anti-depressant or dicyclomine. This request was denied because you have not tried one of these medications.    Please be advised we currently do not have a Pharmacist to review denials, therefore you will need to process appeals accordingly as needed. Thanks for your support at this time.   You may call [phone] or fax [phone], to appeal.

## 2022-12-31 ENCOUNTER — Other Ambulatory Visit (HOSPITAL_COMMUNITY): Payer: Self-pay

## 2022-12-31 ENCOUNTER — Other Ambulatory Visit: Payer: Self-pay | Admitting: Nephrology

## 2022-12-31 DIAGNOSIS — K589 Irritable bowel syndrome without diarrhea: Secondary | ICD-10-CM

## 2022-12-31 DIAGNOSIS — I129 Hypertensive chronic kidney disease with stage 1 through stage 4 chronic kidney disease, or unspecified chronic kidney disease: Secondary | ICD-10-CM

## 2022-12-31 DIAGNOSIS — N1831 Chronic kidney disease, stage 3a: Secondary | ICD-10-CM

## 2022-12-31 MED ORDER — INSULIN LISPRO (1 UNIT DIAL) 100 UNIT/ML (KWIKPEN)
60.0000 [IU] | PEN_INJECTOR | Freq: Every day | SUBCUTANEOUS | 1 refills | Status: DC
  Filled 2023-02-26: qty 18, 30d supply, fill #0
  Filled 2023-05-19: qty 18, 30d supply, fill #1
  Filled 2023-09-09: qty 18, 30d supply, fill #2

## 2023-01-01 ENCOUNTER — Other Ambulatory Visit (HOSPITAL_COMMUNITY): Payer: Self-pay

## 2023-01-01 MED ORDER — CEPHALEXIN 500 MG PO CAPS
500.0000 mg | ORAL_CAPSULE | Freq: Two times a day (BID) | ORAL | 0 refills | Status: DC
  Filled 2023-01-01: qty 10, 5d supply, fill #0

## 2023-01-02 ENCOUNTER — Other Ambulatory Visit (HOSPITAL_COMMUNITY): Payer: Self-pay

## 2023-01-02 DIAGNOSIS — E1129 Type 2 diabetes mellitus with other diabetic kidney complication: Secondary | ICD-10-CM | POA: Diagnosis not present

## 2023-01-02 DIAGNOSIS — I10 Essential (primary) hypertension: Secondary | ICD-10-CM | POA: Diagnosis not present

## 2023-01-02 DIAGNOSIS — Z0000 Encounter for general adult medical examination without abnormal findings: Secondary | ICD-10-CM | POA: Diagnosis not present

## 2023-01-02 DIAGNOSIS — E039 Hypothyroidism, unspecified: Secondary | ICD-10-CM | POA: Diagnosis not present

## 2023-01-02 DIAGNOSIS — E785 Hyperlipidemia, unspecified: Secondary | ICD-10-CM | POA: Diagnosis not present

## 2023-01-02 MED ORDER — XIGDUO XR 10-500 MG PO TB24
1.0000 | ORAL_TABLET | Freq: Every day | ORAL | 0 refills | Status: DC
  Filled 2023-01-02: qty 30, 30d supply, fill #0

## 2023-01-06 DIAGNOSIS — E785 Hyperlipidemia, unspecified: Secondary | ICD-10-CM | POA: Diagnosis not present

## 2023-01-06 DIAGNOSIS — E1129 Type 2 diabetes mellitus with other diabetic kidney complication: Secondary | ICD-10-CM | POA: Diagnosis not present

## 2023-01-06 DIAGNOSIS — N1832 Chronic kidney disease, stage 3b: Secondary | ICD-10-CM | POA: Diagnosis not present

## 2023-01-06 DIAGNOSIS — E039 Hypothyroidism, unspecified: Secondary | ICD-10-CM | POA: Diagnosis not present

## 2023-01-08 ENCOUNTER — Other Ambulatory Visit (HOSPITAL_COMMUNITY): Payer: Self-pay

## 2023-01-09 ENCOUNTER — Other Ambulatory Visit (HOSPITAL_COMMUNITY): Payer: Self-pay

## 2023-01-09 MED ORDER — OMEPRAZOLE 20 MG PO CPDR
20.0000 mg | DELAYED_RELEASE_CAPSULE | Freq: Two times a day (BID) | ORAL | 3 refills | Status: DC
  Filled 2023-01-09: qty 180, 90d supply, fill #0
  Filled 2023-04-14: qty 180, 90d supply, fill #1
  Filled 2023-07-14: qty 180, 90d supply, fill #2
  Filled 2023-10-06: qty 180, 90d supply, fill #3

## 2023-01-13 ENCOUNTER — Other Ambulatory Visit (HOSPITAL_COMMUNITY): Payer: Self-pay

## 2023-01-15 DIAGNOSIS — N1831 Chronic kidney disease, stage 3a: Secondary | ICD-10-CM | POA: Diagnosis not present

## 2023-01-15 DIAGNOSIS — E1129 Type 2 diabetes mellitus with other diabetic kidney complication: Secondary | ICD-10-CM | POA: Diagnosis not present

## 2023-01-15 DIAGNOSIS — J302 Other seasonal allergic rhinitis: Secondary | ICD-10-CM | POA: Diagnosis not present

## 2023-01-15 DIAGNOSIS — J479 Bronchiectasis, uncomplicated: Secondary | ICD-10-CM | POA: Diagnosis not present

## 2023-01-22 NOTE — Progress Notes (Signed)
Agree with the assessment and plan as outlined by Amanda Collier, PA-C. ? ?Xitlally Mooneyham, DO, FACG ? ?

## 2023-01-23 ENCOUNTER — Other Ambulatory Visit (HOSPITAL_COMMUNITY): Payer: Self-pay

## 2023-01-23 MED ORDER — MOUNJARO 7.5 MG/0.5ML ~~LOC~~ SOAJ
7.5000 mg | SUBCUTANEOUS | 3 refills | Status: DC
  Filled 2023-01-23: qty 2, 28d supply, fill #0
  Filled 2023-02-17 (×2): qty 2, 28d supply, fill #1
  Filled 2023-03-17 – 2023-03-20 (×2): qty 2, 28d supply, fill #2
  Filled 2023-04-14: qty 2, 28d supply, fill #3

## 2023-01-27 ENCOUNTER — Other Ambulatory Visit: Payer: Self-pay

## 2023-01-27 ENCOUNTER — Other Ambulatory Visit (HOSPITAL_COMMUNITY): Payer: Self-pay

## 2023-01-28 ENCOUNTER — Other Ambulatory Visit (HOSPITAL_COMMUNITY): Payer: Self-pay

## 2023-01-28 MED ORDER — FENOFIBRATE 145 MG PO TABS
145.0000 mg | ORAL_TABLET | Freq: Every day | ORAL | 1 refills | Status: DC
  Filled 2023-01-28: qty 90, 90d supply, fill #0
  Filled 2023-05-05: qty 90, 90d supply, fill #1

## 2023-01-30 ENCOUNTER — Ambulatory Visit
Admission: RE | Admit: 2023-01-30 | Discharge: 2023-01-30 | Disposition: A | Discharge status: Home / Self Care | Payer: 59 | Source: Ambulatory Visit | Attending: Nephrology | Admitting: Nephrology

## 2023-01-30 DIAGNOSIS — N189 Chronic kidney disease, unspecified: Secondary | ICD-10-CM | POA: Diagnosis not present

## 2023-01-30 DIAGNOSIS — N1831 Chronic kidney disease, stage 3a: Secondary | ICD-10-CM

## 2023-01-30 DIAGNOSIS — I129 Hypertensive chronic kidney disease with stage 1 through stage 4 chronic kidney disease, or unspecified chronic kidney disease: Secondary | ICD-10-CM

## 2023-01-30 DIAGNOSIS — K589 Irritable bowel syndrome without diarrhea: Secondary | ICD-10-CM

## 2023-01-31 ENCOUNTER — Other Ambulatory Visit (HOSPITAL_COMMUNITY): Payer: Self-pay

## 2023-02-05 ENCOUNTER — Other Ambulatory Visit (HOSPITAL_COMMUNITY): Payer: Self-pay

## 2023-02-05 MED ORDER — XIGDUO XR 10-500 MG PO TB24
1.0000 | ORAL_TABLET | Freq: Every day | ORAL | 1 refills | Status: DC
  Filled 2023-02-05 – 2023-02-10 (×2): qty 30, 30d supply, fill #0
  Filled 2023-03-05 – 2023-03-06 (×3): qty 30, 30d supply, fill #1

## 2023-02-06 ENCOUNTER — Other Ambulatory Visit (HOSPITAL_COMMUNITY): Payer: Self-pay

## 2023-02-07 ENCOUNTER — Other Ambulatory Visit (HOSPITAL_COMMUNITY): Payer: Self-pay

## 2023-02-10 ENCOUNTER — Other Ambulatory Visit (HOSPITAL_COMMUNITY): Payer: Self-pay

## 2023-02-11 ENCOUNTER — Other Ambulatory Visit (HOSPITAL_COMMUNITY): Payer: Self-pay

## 2023-02-13 ENCOUNTER — Other Ambulatory Visit (HOSPITAL_COMMUNITY): Payer: Self-pay

## 2023-02-17 ENCOUNTER — Other Ambulatory Visit (HOSPITAL_COMMUNITY): Payer: Self-pay

## 2023-02-17 DIAGNOSIS — R052 Subacute cough: Secondary | ICD-10-CM | POA: Diagnosis not present

## 2023-02-17 DIAGNOSIS — R5383 Other fatigue: Secondary | ICD-10-CM | POA: Diagnosis not present

## 2023-02-17 MED ORDER — GUAIFENESIN-CODEINE 100-10 MG/5ML PO SOLN
5.0000 mL | Freq: Four times a day (QID) | ORAL | 0 refills | Status: DC
  Filled 2023-02-17: qty 200, 10d supply, fill #0

## 2023-02-17 MED ORDER — AMOXICILLIN-POT CLAVULANATE 875-125 MG PO TABS
1.0000 | ORAL_TABLET | Freq: Two times a day (BID) | ORAL | 0 refills | Status: DC
  Filled 2023-02-17: qty 14, 7d supply, fill #0

## 2023-02-19 ENCOUNTER — Other Ambulatory Visit (HOSPITAL_COMMUNITY): Payer: Self-pay

## 2023-02-19 MED ORDER — FREESTYLE LIBRE 3 SENSOR MISC
5 refills | Status: DC
  Filled 2023-02-19: qty 2, 28d supply, fill #0
  Filled 2023-03-17: qty 2, 28d supply, fill #1
  Filled 2023-04-24: qty 2, 28d supply, fill #2
  Filled 2023-05-19: qty 2, 28d supply, fill #3
  Filled 2023-06-17: qty 2, 28d supply, fill #4
  Filled 2023-07-21: qty 2, 28d supply, fill #5

## 2023-02-20 ENCOUNTER — Other Ambulatory Visit (HOSPITAL_COMMUNITY): Payer: Self-pay

## 2023-02-20 MED ORDER — PREDNISONE 10 MG PO TABS
ORAL_TABLET | ORAL | 0 refills | Status: AC
  Filled 2023-02-20: qty 21, 10d supply, fill #0

## 2023-02-26 ENCOUNTER — Other Ambulatory Visit: Payer: Self-pay

## 2023-02-26 ENCOUNTER — Other Ambulatory Visit (HOSPITAL_COMMUNITY): Payer: Self-pay

## 2023-03-05 ENCOUNTER — Other Ambulatory Visit (HOSPITAL_COMMUNITY): Payer: Self-pay

## 2023-03-05 ENCOUNTER — Other Ambulatory Visit: Payer: Self-pay

## 2023-03-06 ENCOUNTER — Other Ambulatory Visit (HOSPITAL_COMMUNITY): Payer: Self-pay

## 2023-03-17 ENCOUNTER — Other Ambulatory Visit (HOSPITAL_COMMUNITY): Payer: Self-pay

## 2023-03-17 MED ORDER — TRESIBA FLEXTOUCH 200 UNIT/ML ~~LOC~~ SOPN
24.0000 [IU] | PEN_INJECTOR | Freq: Every day | SUBCUTANEOUS | 5 refills | Status: DC
  Filled 2023-03-17: qty 9, 75d supply, fill #0
  Filled 2023-06-17: qty 9, 75d supply, fill #1
  Filled 2023-09-02: qty 9, 75d supply, fill #2
  Filled 2024-01-21: qty 9, 75d supply, fill #3

## 2023-03-19 ENCOUNTER — Other Ambulatory Visit (HOSPITAL_COMMUNITY): Payer: Self-pay

## 2023-03-19 ENCOUNTER — Other Ambulatory Visit: Payer: Self-pay

## 2023-03-20 ENCOUNTER — Other Ambulatory Visit (HOSPITAL_COMMUNITY): Payer: Self-pay

## 2023-03-21 ENCOUNTER — Other Ambulatory Visit: Payer: Self-pay

## 2023-03-27 ENCOUNTER — Other Ambulatory Visit (HOSPITAL_COMMUNITY): Payer: Self-pay

## 2023-03-28 ENCOUNTER — Telehealth: Payer: Self-pay | Admitting: Pharmacy Technician

## 2023-03-28 ENCOUNTER — Other Ambulatory Visit (HOSPITAL_COMMUNITY): Payer: Self-pay

## 2023-03-28 NOTE — Telephone Encounter (Signed)
Pharmacy Patient Advocate Encounter   Received notification from CoverMyMeds that prior authorization for VIBERZI 100MG  is required/requested.   Insurance verification completed.   The patient is insured through [facility] .   Per test claim: PA required; PA submitted to [employer] via CoverMyMeds Key/confirmation #/EOC [id number]).  Status is pending.

## 2023-03-31 ENCOUNTER — Other Ambulatory Visit (HOSPITAL_COMMUNITY): Payer: Self-pay

## 2023-03-31 NOTE — Telephone Encounter (Signed)
Patient calling to check on PA. Patient states she is out. Please advise.

## 2023-04-01 ENCOUNTER — Other Ambulatory Visit: Payer: Self-pay

## 2023-04-01 ENCOUNTER — Other Ambulatory Visit (HOSPITAL_COMMUNITY): Payer: Self-pay

## 2023-04-01 NOTE — Telephone Encounter (Signed)
Inbound call from patient stating she is needing medication as soon as possible due to being out. Advised status is still pending. Please advise, thank you.

## 2023-04-01 NOTE — Telephone Encounter (Signed)
Patient took her last Viberzi medication today and is concerned that she will not get a PA due to how long its taking to be approved. She is curious as to if there are any other medications the could replace it or what can she do. Please advise.

## 2023-04-02 ENCOUNTER — Other Ambulatory Visit (HOSPITAL_COMMUNITY): Payer: Self-pay

## 2023-04-02 NOTE — Telephone Encounter (Signed)
Sent MyChart message offering Viberzi samples, we could also try amitriptyline low-dose.  Discussed side effects and the handout.  She will let us know if she wants to try that versus Viberzi samples and/or both.  She would not take them both at the same time which was clarified.

## 2023-04-02 NOTE — Telephone Encounter (Signed)
Samples have been left up front for pt.

## 2023-04-02 NOTE — Telephone Encounter (Signed)
Inbound call from patient providing phone number for insurance to be contacted to answer a few medical questions in regards to medication.  Please advise.   [phone]

## 2023-04-02 NOTE — Telephone Encounter (Signed)
Inbound call from patient stating that she has been approved for the discount card for Viberzi and does not want to change her medication. Patient wants to see if there is anyway possible that we can get this taken care of today because she is out of the medication and if she has a IBS flare up she will be " home bound". Patient stated that she spoke with insurance last night and that they had sent a fax to [place] and if we aren't receiving it they would like for [place] to call them. Please advise.

## 2023-04-03 ENCOUNTER — Other Ambulatory Visit (HOSPITAL_COMMUNITY): Payer: Self-pay

## 2023-04-03 NOTE — Telephone Encounter (Signed)
Pharmacy Patient Advocate Encounter  Received notification from [facility] that Prior Authorization for Viberzi 100MG  tablets  has been DENIED. Please advise how you'd like to proceed. Full denial letter will be uploaded to the media tab. See denial reason below.    Prior authorization requests requiring clinical review are completed by a licensed pharmacist or physician clinical reviewer. Based upon the information we received from you/your healthcare practitioner, we are unable to authorize VIBERZI 100 MG TABLET at this time.  The reason(s) for this determination is (are): Based on the information sent for review, the requested drug did not meet our guideline rules. To get the request approved, your doctor needs to show that you have met the guideline rules below. If you have questions, please call your doctor. In some cases, the requested drug or alternatives offered may have other guideline rules that need to be met.Your provider requested Viberzi 100mg  tablets to manage your condition of irritable bowel syndrome with diarrhea (IBS-D; an intestinal problem causing pain in the belly, gas, and diarrhea). For the treatment of irritable bowel syndrome with diarrhea, our guideline named ELUXADOLINE (Viberzi) requires the following rules to be met for renewal: 1. You had at least 30% decrease in abdominal pain (stomach pain) on a 0-10 point pain scale 2. You had at least 50% reduction in the number of days per week with a stool consistency of mushy stool (Bristol Stool scale type 6) or entirely liquid stool (Bristol Stool scale type 7).This request was denied because we did not receive information that you meet the requirements listed above. Please work with your doctor to use a different medication or get [place] more information if it will allow [place] to approve this request.

## 2023-04-03 NOTE — Telephone Encounter (Signed)
Please see previous note.

## 2023-04-03 NOTE — Telephone Encounter (Signed)
See note below from Quentin Mulling PA:  Doree Albee, PA-C  Emmaline Kluver D Cc: Chrystie Nose, RN Caller: Unspecified (6 days ago,  3:26 PM) I have done an addendum to the note, please resubmit. That is a very weak reason to not do PA for it on insurance part. Let me know, Marchelle Folks

## 2023-04-04 ENCOUNTER — Other Ambulatory Visit (HOSPITAL_COMMUNITY): Payer: Self-pay

## 2023-04-04 NOTE — Telephone Encounter (Signed)
E-Appeal has been sent to plan via CMM as an expedited request.

## 2023-04-07 ENCOUNTER — Other Ambulatory Visit (HOSPITAL_COMMUNITY): Payer: Self-pay

## 2023-04-08 ENCOUNTER — Other Ambulatory Visit (HOSPITAL_COMMUNITY): Payer: Self-pay

## 2023-04-08 ENCOUNTER — Other Ambulatory Visit: Payer: Self-pay

## 2023-04-08 MED ORDER — XIGDUO XR 10-500 MG PO TB24
1.0000 | ORAL_TABLET | Freq: Every day | ORAL | 1 refills | Status: DC
  Filled 2023-04-08: qty 30, 30d supply, fill #0
  Filled 2023-05-05: qty 30, 30d supply, fill #1

## 2023-04-09 ENCOUNTER — Other Ambulatory Visit (HOSPITAL_COMMUNITY): Payer: Self-pay

## 2023-04-10 ENCOUNTER — Other Ambulatory Visit (HOSPITAL_COMMUNITY): Payer: Self-pay

## 2023-04-10 NOTE — Telephone Encounter (Signed)
Do you have the outcome of the appeal for the Viberzi for this patient?

## 2023-04-10 NOTE — Telephone Encounter (Signed)
Patient aware that appela was approved.  She has picked up her Viberzi

## 2023-04-10 NOTE — Telephone Encounter (Signed)
Pharmacy Patient Advocate Encounter  Received notification from [facility] that Appeal for VIBERZI 100MG  has been APPROVED from 8.9.24 to 8.8.25. Spoke to pharmacy to process.Copay is $30.    PA #/Case ID/Reference #: [id number]

## 2023-04-15 ENCOUNTER — Other Ambulatory Visit: Payer: Self-pay

## 2023-04-15 ENCOUNTER — Other Ambulatory Visit (HOSPITAL_COMMUNITY): Payer: Self-pay

## 2023-04-16 DIAGNOSIS — L821 Other seborrheic keratosis: Secondary | ICD-10-CM | POA: Diagnosis not present

## 2023-04-16 DIAGNOSIS — Z85828 Personal history of other malignant neoplasm of skin: Secondary | ICD-10-CM | POA: Diagnosis not present

## 2023-04-16 DIAGNOSIS — L814 Other melanin hyperpigmentation: Secondary | ICD-10-CM | POA: Diagnosis not present

## 2023-04-16 DIAGNOSIS — L570 Actinic keratosis: Secondary | ICD-10-CM | POA: Diagnosis not present

## 2023-04-16 DIAGNOSIS — C44319 Basal cell carcinoma of skin of other parts of face: Secondary | ICD-10-CM | POA: Diagnosis not present

## 2023-04-29 DIAGNOSIS — E1129 Type 2 diabetes mellitus with other diabetic kidney complication: Secondary | ICD-10-CM | POA: Diagnosis not present

## 2023-04-29 DIAGNOSIS — Z0000 Encounter for general adult medical examination without abnormal findings: Secondary | ICD-10-CM | POA: Diagnosis not present

## 2023-04-29 DIAGNOSIS — I10 Essential (primary) hypertension: Secondary | ICD-10-CM | POA: Diagnosis not present

## 2023-04-29 DIAGNOSIS — E785 Hyperlipidemia, unspecified: Secondary | ICD-10-CM | POA: Diagnosis not present

## 2023-04-29 DIAGNOSIS — E039 Hypothyroidism, unspecified: Secondary | ICD-10-CM | POA: Diagnosis not present

## 2023-05-08 ENCOUNTER — Other Ambulatory Visit (HOSPITAL_COMMUNITY): Payer: Self-pay

## 2023-05-08 DIAGNOSIS — I10 Essential (primary) hypertension: Secondary | ICD-10-CM | POA: Diagnosis not present

## 2023-05-08 DIAGNOSIS — N1832 Chronic kidney disease, stage 3b: Secondary | ICD-10-CM | POA: Diagnosis not present

## 2023-05-08 DIAGNOSIS — E1129 Type 2 diabetes mellitus with other diabetic kidney complication: Secondary | ICD-10-CM | POA: Diagnosis not present

## 2023-05-08 MED ORDER — NITROFURANTOIN MONOHYD MACRO 100 MG PO CAPS
100.0000 mg | ORAL_CAPSULE | Freq: Two times a day (BID) | ORAL | 0 refills | Status: AC
  Filled 2023-05-08: qty 10, 5d supply, fill #0

## 2023-05-08 MED ORDER — MOUNJARO 7.5 MG/0.5ML ~~LOC~~ SOAJ
7.5000 mg | SUBCUTANEOUS | 5 refills | Status: DC
  Filled 2023-05-08: qty 2, 28d supply, fill #0

## 2023-05-19 ENCOUNTER — Other Ambulatory Visit (HOSPITAL_COMMUNITY): Payer: Self-pay

## 2023-05-19 ENCOUNTER — Other Ambulatory Visit: Payer: Self-pay

## 2023-05-19 MED ORDER — INSULIN PEN NEEDLE 31G X 5 MM MISC
1.0000 | Freq: Four times a day (QID) | 3 refills | Status: DC
  Filled 2023-05-19: qty 300, 75d supply, fill #0
  Filled 2023-08-11: qty 300, 75d supply, fill #1
  Filled 2023-10-27: qty 300, 75d supply, fill #2
  Filled 2024-01-25: qty 300, 75d supply, fill #3
  Filled 2024-05-02: qty 300, 75d supply, fill #4

## 2023-05-20 DIAGNOSIS — E119 Type 2 diabetes mellitus without complications: Secondary | ICD-10-CM | POA: Diagnosis not present

## 2023-05-20 DIAGNOSIS — H524 Presbyopia: Secondary | ICD-10-CM | POA: Diagnosis not present

## 2023-05-21 ENCOUNTER — Ambulatory Visit: Payer: 59 | Admitting: Orthopedic Surgery

## 2023-05-21 ENCOUNTER — Other Ambulatory Visit (INDEPENDENT_AMBULATORY_CARE_PROVIDER_SITE_OTHER): Payer: Self-pay

## 2023-05-21 ENCOUNTER — Encounter: Payer: Self-pay | Admitting: Orthopedic Surgery

## 2023-05-21 ENCOUNTER — Other Ambulatory Visit: Payer: Self-pay

## 2023-05-21 ENCOUNTER — Other Ambulatory Visit (INDEPENDENT_AMBULATORY_CARE_PROVIDER_SITE_OTHER): Payer: 59

## 2023-05-21 DIAGNOSIS — M25512 Pain in left shoulder: Secondary | ICD-10-CM | POA: Diagnosis not present

## 2023-05-21 DIAGNOSIS — M7552 Bursitis of left shoulder: Secondary | ICD-10-CM | POA: Diagnosis not present

## 2023-05-21 MED ORDER — METHYLPREDNISOLONE ACETATE 40 MG/ML IJ SUSP
40.0000 mg | INTRAMUSCULAR | Status: AC | PRN
Start: 2023-05-21 — End: 2023-05-21
  Administered 2023-05-21: 40 mg via INTRA_ARTICULAR

## 2023-05-21 MED ORDER — BUPIVACAINE HCL 0.5 % IJ SOLN
9.0000 mL | INTRAMUSCULAR | Status: AC | PRN
Start: 2023-05-21 — End: 2023-05-21
  Administered 2023-05-21: 9 mL via INTRA_ARTICULAR

## 2023-05-21 MED ORDER — LIDOCAINE HCL 1 % IJ SOLN
5.0000 mL | INTRAMUSCULAR | Status: AC | PRN
Start: 2023-05-21 — End: 2023-05-21
  Administered 2023-05-21: 5 mL

## 2023-05-21 NOTE — Progress Notes (Signed)
Office Visit Note   Patient: Heather Stout           Date of Birth: [birth year]           MRN: [id number] Visit Date: 05/21/2023 Requested by: Merri [name], MD [address] [place],  [place] [address] PCP: Merri [name], MD  Subjective: Chief Complaint  Patient presents with   Left Shoulder - Pain    Acute onset since 05/10/23-[facility]    HPI: Heather Stout is a 67 y.o. female who presents to the office reporting left shoulder pain since 914.  Denies any history of injury.  Occasionally the pain will radiate down to the elbow but not below the elbow.  Has a little bit of scapular pain on several occasions but no mechanical symptoms in the shoulder.  Hard for her to lay on the left-hand side.  Does describe 1 episode of radiation of the pain up to the neck.  Also some pain in the axilla on 2 occasions.  Does report stiffness in the shoulder at night.  She has had right shoulder rotator cuff surgery..                ROS: All systems reviewed are negative as they relate to the chief complaint within the history of present illness.  Patient denies fevers or chills.  Assessment & Plan: Visit Diagnoses:  1. Left shoulder pain, unspecified chronicity     Plan: Impression is left shoulder bursitis with possible rotator cuff pathology.  Ultrasound does not really show much in the way of overt full-thickness rotator cuff tear and her examination is not really consistent with that either.  Does not look like adhesive capsulitis but that would be a consideration as well with her history of diabetes.  Her diabetes is well-controlled.  Plan at this time is subacromial injection with consideration of further imaging in terms of MRI scanning if she has not improved.  She will let us know how she is doing with that.  Follow-Up Instructions: No follow-ups on file.   Orders:  Orders Placed This Encounter  Procedures   XR Shoulder Left   XR Cervical Spine 2 or 3 views   No orders of  the defined types were placed in this encounter.     Procedures: Large Joint Inj: L subacromial bursa on 05/21/2023 11:45 AM Indications: diagnostic evaluation and pain Details: 18 G 1.5 in needle, posterior approach  Arthrogram: No  Medications: 9 mL bupivacaine 0.5 %; 40 mg methylPREDNISolone acetate 40 MG/ML; 5 mL lidocaine 1 % Outcome: tolerated well, no immediate complications Procedure, treatment alternatives, risks and benefits explained, specific risks discussed. Consent was given by the patient. Immediately prior to procedure a time out was called to verify the correct patient, procedure, equipment, support staff and site/side marked as required. Patient was prepped and draped in the usual sterile fashion.       Clinical Data: No additional findings.  Objective: Vital Signs: There were no vitals taken for this visit.  Physical Exam:  Constitutional: Patient appears well-developed HEENT:  Head: Normocephalic Eyes:EOM are normal Neck: Normal range of motion Cardiovascular: Normal rate Pulmonary/chest: Effort normal Neurologic: Patient is alert Skin: Skin is warm Psychiatric: Patient has normal mood and affect  Ortho Exam: Ortho exam demonstrates range of motion bilaterally of 60/110/170.  Rotator cuff strength is excellent to external rotation at 5+ out of 5 bilaterally as well as subscap strength testing at 5+ out of 5 bilaterally.  Popeye  deformity present on the left but minimal visual deformity due to chronicity of the biceps tendon rupture.  She has no real coarse grinding or crepitus with internal/external rotation of that left shoulder no discrete AC joint tenderness on the left versus right.  No masses lymphadenopathy or skin changes noted in that shoulder girdle region.  Ultrasound demonstrated intact subscap with some partial-thickness tearing of that supraspinatus.  Does not look like she has a full-thickness tear.  Specialty Comments:  No specialty comments  available.  Imaging: XR Shoulder Left  Result Date: 05/21/2023 AP axillary outlet radiographs left shoulder reviewed.  Shoulder is located.  No acute fracture.  No glenohumeral joint degenerative changes or AC joint degenerative changes noted.  Acromiohumeral distance maintained.  Visualized lung fields intact  XR Cervical Spine 2 or 3 views  Result Date: 05/21/2023 AP lateral cervical spine radiographs reviewed.  Slight loss of lordosis is present.  Moderate degenerative changes noted at C5-6.  Joint spaces otherwise maintained.  Mild facet arthritis in the lower cervical spine region.  No spondylolisthesis or compression fractures.    PMFS History: Patient Active Problem List   Diagnosis Date Noted   Traumatic complete tear of right rotator cuff    Protrusion of lumbar intervertebral disc 10/25/2020   Essential hypertension 04/04/2015   Hyperlipemia 04/04/2015   Irritable bowel syndrome 04/04/2015   Diabetes (HCC) 11/29/2014   Past Medical History:  Diagnosis Date   Asthma    Essential hypertension, benign    GERD (gastroesophageal reflux disease)    IBS (irritable bowel syndrome)    Left anterior hemiblock    Other and unspecified hyperlipidemia    Sleep apnea    cpap   Thyroid disease    Type II or unspecified type diabetes mellitus without mention of complication, not stated as uncontrolled     Family History  Problem Relation Age of Onset   Hypertension Father    Hypertension Sister    Diabetes Sister    Hypertension Brother    Diabetes Brother    Hypertension Brother    Crohn's disease Brother    Colon cancer Neg Hx    Colon polyps Neg Hx    Esophageal cancer Neg Hx    Rectal cancer Neg Hx    Stomach cancer Neg Hx     Past Surgical History:  Procedure Laterality Date   FUNCTIONAL ENDOSCOPIC SINUS SURGERY  08/26/1988   KNEE ARTHROSCOPY Left 2013   LUMBAR LAMINECTOMY  08/26/2000   LUMBAR LAMINECTOMY  08/27/2003   SHOULDER ARTHROSCOPY WITH OPEN ROTATOR CUFF  REPAIR Right 05/15/2020   Procedure: right shoulder arthroscopy, debridement, mini open rotator cuff tear repair, biceps tenodesis;  Surgeon: Cammy Copa, MD;  Location: [facility];  Service: Orthopedics;  Laterality: Right;   SHOULDER ARTHROSCOPY WITH OPEN ROTATOR CUFF REPAIR Right 05/21/2021   Procedure: RIGHT SHOULDER ARTHROSCOPY WITH OPEN ROTATOR CUFF REPAIR;  Surgeon: Cammy Copa, MD;  Location:  SURGERY CENTER;  Service: Orthopedics;  Laterality: Right;   TONSILLECTOMY  08/26/1978   Social History   Occupational History   Occupation: Community education officer  Tobacco Use   Smoking status: Never   Smokeless tobacco: Never  Substance and Sexual Activity   Alcohol use: No    Alcohol/week: 0.0 standard drinks of alcohol   Drug use: No   Sexual activity: Not on file

## 2023-05-28 ENCOUNTER — Other Ambulatory Visit (HOSPITAL_COMMUNITY): Payer: Self-pay

## 2023-05-28 DIAGNOSIS — C44319 Basal cell carcinoma of skin of other parts of face: Secondary | ICD-10-CM | POA: Diagnosis not present

## 2023-05-28 DIAGNOSIS — Z85828 Personal history of other malignant neoplasm of skin: Secondary | ICD-10-CM | POA: Diagnosis not present

## 2023-05-28 MED ORDER — DOXYCYCLINE HYCLATE 100 MG PO CAPS
100.0000 mg | ORAL_CAPSULE | Freq: Two times a day (BID) | ORAL | 0 refills | Status: AC
  Filled 2023-05-28: qty 10, 5d supply, fill #0

## 2023-06-09 ENCOUNTER — Other Ambulatory Visit: Payer: Self-pay

## 2023-06-09 ENCOUNTER — Other Ambulatory Visit (HOSPITAL_COMMUNITY): Payer: Self-pay

## 2023-06-09 MED ORDER — MOUNJARO 7.5 MG/0.5ML ~~LOC~~ SOAJ
7.5000 mg | SUBCUTANEOUS | 1 refills | Status: DC
  Filled 2023-06-09: qty 2, 28d supply, fill #0
  Filled 2023-07-06: qty 2, 28d supply, fill #1
  Filled 2023-08-03: qty 2, 28d supply, fill #2
  Filled 2023-09-02: qty 2, 28d supply, fill #3
  Filled 2023-10-01: qty 2, 28d supply, fill #4
  Filled 2023-10-27: qty 2, 28d supply, fill #5

## 2023-06-10 ENCOUNTER — Other Ambulatory Visit (HOSPITAL_COMMUNITY): Payer: Self-pay

## 2023-06-11 ENCOUNTER — Other Ambulatory Visit (HOSPITAL_COMMUNITY): Payer: Self-pay

## 2023-06-12 ENCOUNTER — Other Ambulatory Visit (HOSPITAL_BASED_OUTPATIENT_CLINIC_OR_DEPARTMENT_OTHER): Payer: Self-pay

## 2023-06-13 ENCOUNTER — Other Ambulatory Visit (HOSPITAL_COMMUNITY): Payer: Self-pay

## 2023-06-13 MED ORDER — XIGDUO XR 10-500 MG PO TB24
1.0000 | ORAL_TABLET | Freq: Every day | ORAL | 3 refills | Status: DC
  Filled 2023-06-13: qty 30, 30d supply, fill #0
  Filled 2023-07-14: qty 30, 30d supply, fill #1
  Filled 2023-08-03 – 2023-08-07 (×2): qty 30, 30d supply, fill #2
  Filled 2023-09-09: qty 30, 30d supply, fill #3

## 2023-06-17 ENCOUNTER — Other Ambulatory Visit (HOSPITAL_COMMUNITY): Payer: Self-pay

## 2023-06-17 ENCOUNTER — Other Ambulatory Visit: Payer: Self-pay

## 2023-07-01 DIAGNOSIS — E1129 Type 2 diabetes mellitus with other diabetic kidney complication: Secondary | ICD-10-CM | POA: Diagnosis not present

## 2023-07-01 DIAGNOSIS — E039 Hypothyroidism, unspecified: Secondary | ICD-10-CM | POA: Diagnosis not present

## 2023-07-06 ENCOUNTER — Other Ambulatory Visit: Payer: Self-pay | Admitting: Physician Assistant

## 2023-07-06 DIAGNOSIS — K580 Irritable bowel syndrome with diarrhea: Secondary | ICD-10-CM

## 2023-07-07 ENCOUNTER — Other Ambulatory Visit: Payer: Self-pay

## 2023-07-07 ENCOUNTER — Other Ambulatory Visit (HOSPITAL_COMMUNITY): Payer: Self-pay

## 2023-07-07 ENCOUNTER — Encounter (HOSPITAL_COMMUNITY): Payer: Self-pay

## 2023-07-07 MED ORDER — VIBERZI 100 MG PO TABS
100.0000 mg | ORAL_TABLET | Freq: Two times a day (BID) | ORAL | 3 refills | Status: DC
  Filled 2023-07-07: qty 180, 90d supply, fill #0
  Filled 2023-10-01: qty 180, 90d supply, fill #1

## 2023-07-08 ENCOUNTER — Other Ambulatory Visit (HOSPITAL_COMMUNITY): Payer: Self-pay

## 2023-08-03 ENCOUNTER — Other Ambulatory Visit (HOSPITAL_COMMUNITY): Payer: Self-pay

## 2023-08-04 ENCOUNTER — Other Ambulatory Visit: Payer: Self-pay

## 2023-08-04 ENCOUNTER — Other Ambulatory Visit (HOSPITAL_COMMUNITY): Payer: Self-pay

## 2023-08-04 MED ORDER — FENOFIBRATE 145 MG PO TABS
145.0000 mg | ORAL_TABLET | Freq: Every day | ORAL | 1 refills | Status: DC
  Filled 2023-08-04: qty 90, 90d supply, fill #0
  Filled 2023-11-06: qty 90, 90d supply, fill #1

## 2023-08-04 MED ORDER — FREESTYLE LIBRE 3 SENSOR MISC
5 refills | Status: AC
  Filled 2023-08-04 – 2023-08-15 (×4): qty 2, 28d supply, fill #0
  Filled 2023-09-11: qty 2, 28d supply, fill #1
  Filled 2023-10-06: qty 2, 28d supply, fill #2
  Filled 2023-11-06: qty 2, 28d supply, fill #3
  Filled 2023-12-01: qty 2, 28d supply, fill #4
  Filled 2024-01-05: qty 2, 28d supply, fill #5

## 2023-08-06 ENCOUNTER — Other Ambulatory Visit (HOSPITAL_COMMUNITY): Payer: Self-pay

## 2023-08-06 ENCOUNTER — Other Ambulatory Visit: Payer: Self-pay

## 2023-08-07 ENCOUNTER — Other Ambulatory Visit: Payer: Self-pay

## 2023-08-07 ENCOUNTER — Other Ambulatory Visit (HOSPITAL_COMMUNITY): Payer: Self-pay

## 2023-08-11 ENCOUNTER — Other Ambulatory Visit (HOSPITAL_COMMUNITY): Payer: Self-pay

## 2023-08-12 ENCOUNTER — Other Ambulatory Visit: Payer: Self-pay

## 2023-08-12 ENCOUNTER — Other Ambulatory Visit (HOSPITAL_COMMUNITY): Payer: Self-pay

## 2023-08-15 ENCOUNTER — Other Ambulatory Visit (HOSPITAL_COMMUNITY): Payer: Self-pay

## 2023-08-28 ENCOUNTER — Other Ambulatory Visit (INDEPENDENT_AMBULATORY_CARE_PROVIDER_SITE_OTHER): Payer: No Typology Code available for payment source

## 2023-08-28 ENCOUNTER — Ambulatory Visit: Payer: No Typology Code available for payment source | Admitting: Surgical

## 2023-08-28 ENCOUNTER — Encounter: Payer: Self-pay | Admitting: Surgical

## 2023-08-28 ENCOUNTER — Other Ambulatory Visit: Payer: Self-pay

## 2023-08-28 DIAGNOSIS — M25531 Pain in right wrist: Secondary | ICD-10-CM | POA: Diagnosis not present

## 2023-08-28 DIAGNOSIS — M19031 Primary osteoarthritis, right wrist: Secondary | ICD-10-CM

## 2023-08-28 MED ORDER — LIDOCAINE HCL 1 % IJ SOLN
5.0000 mL | INTRAMUSCULAR | Status: AC | PRN
  Administered 2023-08-28: 5 mL

## 2023-08-28 MED ORDER — METHYLPREDNISOLONE ACETATE 40 MG/ML IJ SUSP
13.3300 mg | INTRAMUSCULAR | Status: AC | PRN
  Administered 2023-08-28: 13.33 mg via INTRA_ARTICULAR

## 2023-08-28 MED ORDER — BUPIVACAINE HCL 0.25 % IJ SOLN
0.6600 mL | INTRAMUSCULAR | Status: AC | PRN
  Administered 2023-08-28: .66 mL via INTRA_ARTICULAR

## 2023-08-28 NOTE — Progress Notes (Signed)
 Office Visit Note   Patient: Heather Stout           Date of Birth: Apr 02, 1956           MRN: [id number] Visit Date: 08/28/2023 Requested by: Clarice Nottingham, MD [address] [place],  [place] [address] PCP: Clarice Nottingham, MD  Subjective: Chief Complaint  Patient presents with   Right Wrist - Pain    HPI: Heather Stout is a 68 y.o. female who presents to the office reporting right wrist pain.  Patient states that she awoke today with moderate to severe wrist pain.  She has no history of significant wrist pain in the past.  She had no difficulty when she went to sleep.  Has decreased function with her wrist due to pain.  Has difficulty lifting small objects like a coffee cup and difficulty making a full fist.  She has constant pain with no fevers or chills.  Does not really note much swelling.  No redness.  Has not taken any pain medication for it.  She does have history of suspected carpal tunnel syndrome that she uses a wrist splint for with numbness and tingling in the first 3 fingers.  This does not bother her anymore than it has in the past.  She does have family history of gout (her brother).  She has never had any gout or pseudogout attacks.              ROS: All systems reviewed are negative as they relate to the chief complaint within the history of present illness.  Patient denies fevers or chills.  Assessment & Plan: Visit Diagnoses:  1. Arthritis of right wrist   2. Pain in right wrist     Plan: Patient is a 68 year old female who presents with acute right wrist pain.  Based on her history, this is suspicious for possible gout versus pseudogout attack which has happened in her family but has not happened for her personally.  She went to bed without difficulty last night after eating a dinner consisting of bacon/eggs/toast.  Awoke with this morning with wrist pain.  Septic arthritis is also a consideration but there is no significant warmth or evidence of  cellulitis around the wrist and patient denies any fevers or chills.  We discussed options available to patient.  Radiographs demonstrate some mild degenerative changes throughout the right wrist and DRUJ but nothing to account for the severity of her symptoms.  We decided to proceed with aspiration/injection.  Wrist aspiration was attempted under ultrasound guidance with needle confirmation in the right wrist joint but no fluid was able to be aspirated; injection was then administered into the wrist with easy flow of fluid and no complication.  Patient will reach out to me personally if no improvement or if symptoms worsen overnight.  We will also check in tomorrow morning when she returns to work tomorrow.  She does have slight calcification of the TFCC which points more towards pseudogout than septic arthritis especially with substantial improvement of pain during the anesthetic portion of the injection which would not really be expected in the presence of infection.  If there is no improvement in symptoms, another consideration would be acute inflammation of the fourth dorsal compartment.  On ultrasound examination, all of the dorsal compartments appeared unremarkable aside from the fourth compartment which did have relatively small but notable amount of anechoic fluid present.  If symptoms persist, could consider aspiration/injection of this compartment tomorrow.  Follow-Up Instructions: No follow-ups on file.   Orders:  Orders Placed This Encounter  Procedures   XR Wrist Complete Right   US  Guided Needle Placement - No Linked Charges   No orders of the defined types were placed in this encounter.     Procedures: Medium Joint Inj: R radiocarpal on 08/28/2023 5:22 PM Indications: pain and diagnostic evaluation Details: 22 G 1.5 in needle, ultrasound-guided dorsal approach Medications: 5 mL lidocaine  1 %; 13.33 mg methylPREDNISolone  acetate 40 MG/ML; 0.66 mL bupivacaine  0.25 % Outcome:  tolerated well, no immediate complications Procedure, treatment alternatives, risks and benefits explained, specific risks discussed. Consent was given by the patient. Immediately prior to procedure a time out was called to verify the correct patient, procedure, equipment, support staff and site/side marked as required. Patient was prepped and draped in the usual sterile fashion.       Clinical Data: No additional findings.  Objective: Vital Signs: There were no vitals taken for this visit.  Physical Exam:  Constitutional: Patient appears well-developed HEENT:  Head: Normocephalic Eyes:EOM are normal Neck: Normal range of motion Cardiovascular: Normal rate Pulmonary/chest: Effort normal Neurologic: Patient is alert Skin: Skin is warm Psychiatric: Patient has normal mood and affect  Ortho Exam: Ortho exam demonstrates right wrist with mild swelling.  No significant cellulitis or skin changes noted.  Palpable radial pulse of the right upper extremity.  Intact EPL, FPL, finger abduction, grip strength testing.  She does have reproduction of pain primarily with grip strength testing and making a full composite fist.  She is able to make a fist but it is very painful for her.  She has tenderness primarily over the dorsal aspect of the wrist and some pain reproduced with passive motion of the fingers.  She has increased pain with flexion and extension of the wrist as well as radial/ulnar deviation.  No pain with elbow range of motion or pronation/supination in the elbow though this does reproduce some wrist pain.  Specialty Comments:  No specialty comments available.  Imaging: No results found.   PMFS History: Patient Active Problem List   Diagnosis Date Noted   Traumatic complete tear of right rotator cuff    Protrusion of lumbar intervertebral disc 10/25/2020   Essential hypertension 04/04/2015   Hyperlipemia 04/04/2015   Irritable bowel syndrome 04/04/2015   Diabetes (HCC)  11/29/2014   Past Medical History:  Diagnosis Date   Asthma    Essential hypertension, benign    GERD (gastroesophageal reflux disease)    IBS (irritable bowel syndrome)    Left anterior hemiblock    Other and unspecified hyperlipidemia    Sleep apnea    cpap   Thyroid  disease    Type II or unspecified type diabetes mellitus without mention of complication, not stated as uncontrolled     Family History  Problem Relation Age of Onset   Hypertension Father    Hypertension Sister    Diabetes Sister    Hypertension Brother    Diabetes Brother    Hypertension Brother    Crohn's disease Brother    Colon cancer Neg Hx    Colon polyps Neg Hx    Esophageal cancer Neg Hx    Rectal cancer Neg Hx    Stomach cancer Neg Hx     Past Surgical History:  Procedure Laterality Date   FUNCTIONAL ENDOSCOPIC SINUS SURGERY  08/26/1988   KNEE ARTHROSCOPY Left 2013   LUMBAR LAMINECTOMY  08/26/2000   LUMBAR LAMINECTOMY  08/27/2003  SHOULDER ARTHROSCOPY WITH OPEN ROTATOR CUFF REPAIR Right 05/15/2020   Procedure: right shoulder arthroscopy, debridement, mini open rotator cuff tear repair, biceps tenodesis;  Surgeon: Addie Cordella Hamilton, MD;  Location: [facility];  Service: Orthopedics;  Laterality: Right;   SHOULDER ARTHROSCOPY WITH OPEN ROTATOR CUFF REPAIR Right 05/21/2021   Procedure: RIGHT SHOULDER ARTHROSCOPY WITH OPEN ROTATOR CUFF REPAIR;  Surgeon: Addie Cordella Hamilton, MD;  Location: [facility];  Service: Orthopedics;  Laterality: Right;   TONSILLECTOMY  08/26/1978   Social History   Occupational History   Occupation: Community Education Officer  Tobacco Use   Smoking status: Never   Smokeless tobacco: Never  Substance and Sexual Activity   Alcohol use: No    Alcohol/week: 0.0 standard drinks of alcohol   Drug use: No   Sexual activity: Not on file

## 2023-09-02 ENCOUNTER — Other Ambulatory Visit (HOSPITAL_COMMUNITY): Payer: Self-pay

## 2023-09-02 DIAGNOSIS — R0989 Other specified symptoms and signs involving the circulatory and respiratory systems: Secondary | ICD-10-CM | POA: Diagnosis not present

## 2023-09-02 DIAGNOSIS — R051 Acute cough: Secondary | ICD-10-CM | POA: Diagnosis not present

## 2023-09-02 DIAGNOSIS — R350 Frequency of micturition: Secondary | ICD-10-CM | POA: Diagnosis not present

## 2023-09-02 MED ORDER — BENZONATATE 200 MG PO CAPS
200.0000 mg | ORAL_CAPSULE | Freq: Three times a day (TID) | ORAL | 0 refills | Status: AC | PRN
  Filled 2023-09-02: qty 42, 14d supply, fill #0

## 2023-09-02 MED ORDER — DOXYCYCLINE HYCLATE 100 MG PO TABS
100.0000 mg | ORAL_TABLET | Freq: Two times a day (BID) | ORAL | 0 refills | Status: DC
  Filled 2023-09-02: qty 14, 7d supply, fill #0

## 2023-09-18 ENCOUNTER — Other Ambulatory Visit (HOSPITAL_COMMUNITY): Payer: Self-pay

## 2023-09-18 MED ORDER — GEMTESA 75 MG PO TABS
75.0000 mg | ORAL_TABLET | Freq: Every day | ORAL | 5 refills | Status: AC
  Filled 2023-09-18 – 2023-09-22 (×2): qty 30, 30d supply, fill #0

## 2023-09-22 ENCOUNTER — Other Ambulatory Visit (HOSPITAL_COMMUNITY): Payer: Self-pay

## 2023-09-22 ENCOUNTER — Other Ambulatory Visit: Payer: Self-pay

## 2023-09-23 ENCOUNTER — Other Ambulatory Visit (HOSPITAL_COMMUNITY): Payer: Self-pay

## 2023-09-23 MED ORDER — MIRABEGRON ER 25 MG PO TB24
25.0000 mg | ORAL_TABLET | Freq: Every day | ORAL | 0 refills | Status: DC
  Filled 2023-09-23: qty 90, 90d supply, fill #0

## 2023-10-01 ENCOUNTER — Other Ambulatory Visit: Payer: Self-pay

## 2023-10-01 ENCOUNTER — Other Ambulatory Visit (HOSPITAL_COMMUNITY): Payer: Self-pay

## 2023-10-02 ENCOUNTER — Other Ambulatory Visit (HOSPITAL_COMMUNITY): Payer: Self-pay

## 2023-10-06 ENCOUNTER — Other Ambulatory Visit: Payer: Self-pay

## 2023-10-06 ENCOUNTER — Other Ambulatory Visit (HOSPITAL_COMMUNITY): Payer: Self-pay

## 2023-10-08 ENCOUNTER — Other Ambulatory Visit (HOSPITAL_COMMUNITY): Payer: Self-pay

## 2023-10-08 MED ORDER — XIGDUO XR 10-500 MG PO TB24
1.0000 | ORAL_TABLET | Freq: Every day | ORAL | 5 refills | Status: DC
  Filled 2023-10-08: qty 30, 30d supply, fill #0
  Filled 2023-12-01: qty 30, 30d supply, fill #1
  Filled 2023-12-30: qty 30, 30d supply, fill #2
  Filled 2024-02-15: qty 30, 30d supply, fill #3
  Filled 2024-03-18: qty 30, 30d supply, fill #4
  Filled 2024-04-19: qty 30, 30d supply, fill #5

## 2023-10-13 ENCOUNTER — Other Ambulatory Visit (HOSPITAL_COMMUNITY): Payer: Self-pay

## 2023-10-13 MED ORDER — XIGDUO XR 10-500 MG PO TB24
1.0000 | ORAL_TABLET | Freq: Every day | ORAL | 5 refills | Status: DC
  Filled 2023-11-06: qty 30, 30d supply, fill #0

## 2023-10-20 ENCOUNTER — Other Ambulatory Visit (HOSPITAL_COMMUNITY): Payer: Self-pay

## 2023-10-21 ENCOUNTER — Other Ambulatory Visit (HOSPITAL_COMMUNITY): Payer: Self-pay

## 2023-10-21 MED ORDER — EDARBYCLOR 40-25 MG PO TABS
1.0000 | ORAL_TABLET | Freq: Every day | ORAL | 2 refills | Status: DC
  Filled 2023-10-21: qty 90, 90d supply, fill #0
  Filled 2024-01-25: qty 90, 90d supply, fill #1
  Filled 2024-04-19: qty 90, 90d supply, fill #2

## 2023-10-27 ENCOUNTER — Other Ambulatory Visit (HOSPITAL_COMMUNITY): Payer: Self-pay

## 2023-10-27 ENCOUNTER — Other Ambulatory Visit: Payer: Self-pay

## 2023-11-04 DIAGNOSIS — E1129 Type 2 diabetes mellitus with other diabetic kidney complication: Secondary | ICD-10-CM | POA: Diagnosis not present

## 2023-11-04 DIAGNOSIS — E039 Hypothyroidism, unspecified: Secondary | ICD-10-CM | POA: Diagnosis not present

## 2023-11-05 ENCOUNTER — Other Ambulatory Visit (HOSPITAL_COMMUNITY): Payer: Self-pay

## 2023-11-06 ENCOUNTER — Other Ambulatory Visit (HOSPITAL_COMMUNITY): Payer: Self-pay

## 2023-11-12 ENCOUNTER — Other Ambulatory Visit (HOSPITAL_COMMUNITY): Payer: Self-pay

## 2023-11-12 DIAGNOSIS — E039 Hypothyroidism, unspecified: Secondary | ICD-10-CM | POA: Diagnosis not present

## 2023-11-12 DIAGNOSIS — I10 Essential (primary) hypertension: Secondary | ICD-10-CM | POA: Diagnosis not present

## 2023-11-12 DIAGNOSIS — E785 Hyperlipidemia, unspecified: Secondary | ICD-10-CM | POA: Diagnosis not present

## 2023-11-12 DIAGNOSIS — E1129 Type 2 diabetes mellitus with other diabetic kidney complication: Secondary | ICD-10-CM | POA: Diagnosis not present

## 2023-11-12 MED ORDER — FREESTYLE LIBRE 14 DAY SENSOR MISC
1.0000 | 11 refills | Status: AC
  Filled 2023-11-12: qty 2, 28d supply, fill #0

## 2023-11-24 ENCOUNTER — Other Ambulatory Visit (HOSPITAL_COMMUNITY): Payer: Self-pay

## 2023-11-24 MED ORDER — MOUNJARO 7.5 MG/0.5ML ~~LOC~~ SOAJ
7.5000 mg | SUBCUTANEOUS | 1 refills | Status: DC
Start: 2023-11-24 — End: 2024-05-10
  Filled 2023-11-24: qty 6, 84d supply, fill #0
  Filled 2024-02-15: qty 6, 84d supply, fill #1

## 2023-11-24 MED ORDER — SOLIFENACIN SUCCINATE 5 MG PO TABS
5.0000 mg | ORAL_TABLET | Freq: Every day | ORAL | 3 refills | Status: AC
  Filled 2023-11-24: qty 30, 30d supply, fill #0

## 2023-11-25 ENCOUNTER — Ambulatory Visit: Payer: Commercial Managed Care - PPO | Admitting: Gastroenterology

## 2023-11-25 ENCOUNTER — Encounter: Payer: Self-pay | Admitting: Gastroenterology

## 2023-11-25 ENCOUNTER — Other Ambulatory Visit (HOSPITAL_COMMUNITY): Payer: Self-pay

## 2023-11-25 VITALS — BP 126/72 | HR 75 | Ht 66.0 in | Wt 168.0 lb

## 2023-11-25 DIAGNOSIS — K580 Irritable bowel syndrome with diarrhea: Secondary | ICD-10-CM

## 2023-11-25 DIAGNOSIS — Z860101 Personal history of adenomatous and serrated colon polyps: Secondary | ICD-10-CM | POA: Diagnosis not present

## 2023-11-25 DIAGNOSIS — Z860100 Personal history of colon polyps, unspecified: Secondary | ICD-10-CM

## 2023-11-25 MED ORDER — VIBERZI 100 MG PO TABS
100.0000 mg | ORAL_TABLET | Freq: Two times a day (BID) | ORAL | 3 refills | Status: AC
  Filled 2023-12-29: qty 180, 90d supply, fill #0
  Filled 2024-03-29: qty 180, 90d supply, fill #1

## 2023-11-25 NOTE — Progress Notes (Signed)
 Chief Complaint: Med refill Primary GI MD: Dr. Barron Alvine  HPI: 68 year old female past medical history of hypertension, OSA, hypothyroidism, type 2 diabetes, GERD, IBS-D and others listed below presents for medication refill  Last seen April 2024 by Quentin Mulling, PA-C.  She was given Viberzi 100 Mg twice daily with considerable benefit  Patient states she continues to benefit from Viberzi which controls her symptoms.  She occasionally has some breakthrough abdominal cramping but but she states this is a great improvement compared to where she was previously and it is not a problem for her.  Extensive family history of GI issues including multiple family members with inflammatory bowel disease and RA.    PREVIOUS GI WORKUP   10/30/2022 colonoscopy [facility] for screening colonoscopy for polyps 4 to 6 mm size 2 polyps 4-8 mm transverse colon, diverticulosis, tortuous colon 1 polyp was hyperplastic but the other polyps were tubular adenomatous and 1 sessile serrated recall 3 years  Past Medical History:  Diagnosis Date   Asthma    Essential hypertension, benign    GERD (gastroesophageal reflux disease)    IBS (irritable bowel syndrome)    Left anterior hemiblock    Other and unspecified hyperlipidemia    Sleep apnea    cpap   Thyroid disease    Type II or unspecified type diabetes mellitus without mention of complication, not stated as uncontrolled     Past Surgical History:  Procedure Laterality Date   FUNCTIONAL ENDOSCOPIC SINUS SURGERY  08/26/1988   KNEE ARTHROSCOPY Left 2013   LUMBAR LAMINECTOMY  08/26/2000   LUMBAR LAMINECTOMY  08/27/2003   SHOULDER ARTHROSCOPY WITH OPEN ROTATOR CUFF REPAIR Right 05/15/2020   Procedure: right shoulder arthroscopy, debridement, mini open rotator cuff tear repair, biceps tenodesis;  Surgeon: Cammy Copa, MD;  Location: [facility];  Service: Orthopedics;  Laterality: Right;   SHOULDER ARTHROSCOPY WITH OPEN ROTATOR  CUFF REPAIR Right 05/21/2021   Procedure: RIGHT SHOULDER ARTHROSCOPY WITH OPEN ROTATOR CUFF REPAIR;  Surgeon: Cammy Copa, MD;  Location: [facility];  Service: Orthopedics;  Laterality: Right;   TONSILLECTOMY  08/26/1978    Current Outpatient Medications  Medication Sig Dispense Refill   albuterol (PROAIR HFA) 108 (90 Base) MCG/ACT inhaler Inhale 1 puff into the lungs every 4 (four) hours as needed. 18 g 2   aspirin 81 MG tablet Take 81 mg by mouth daily.     Azilsartan-Chlorthalidone (EDARBYCLOR) 40-25 MG TABS Take 1 tablet by mouth daily. 90 tablet 2   Calcium Carbonate-Vitamin D 500-125 MG-UNIT TABS Take by mouth.     cetirizine (ZYRTEC) 10 MG tablet Take 10 mg by mouth daily.     cholecalciferol (VITAMIN D) 1000 UNITS tablet Take 2,000 Units by mouth daily.      Continuous Glucose Sensor (FREESTYLE LIBRE 14 DAY SENSOR) MISC Apply 1 every 14 (fourteen) days. 2 each 11   Continuous Glucose Sensor (FREESTYLE LIBRE 3 SENSOR) MISC Use as directed to monitor blood glucose continuously, changing the sensor every 14 days. 2 each 5   Cyanocobalamin (VITAMIN B 12 PO) Take by mouth.     Dapagliflozin Pro-metFORMIN ER (XIGDUO XR) 10-500 MG TB24 Take 1 tablet by mouth daily. 30 tablet 5   fenofibrate (TRICOR) 145 MG tablet Take 1 tablet (145 mg total) by mouth daily. 90 tablet 1   hyoscyamine (LEVBID) 0.375 MG 12 hr tablet Take 1 tablet (0.375 mg total) by mouth every 12 (twelve) hours as needed for symptoms 60 tablet  5   insulin degludec (TRESIBA FLEXTOUCH) 200 UNIT/ML FlexTouch Pen Inject 24 Units into the skin daily. 18 mL 5   insulin lispro (HUMALOG KWIKPEN) 100 UNIT/ML KwikPen Inject 10-15 Units into the skin 3 (three) times daily before meals plus correctional. Max up to 60 units daily. 39 mL 1   Insulin Pen Needle 31G X 5 MM MISC Use as directed 3 to 4 times daily with insulin 400 each 3   Lifitegrast (XIIDRA) 5 % SOLN Place 1 drop into both eyes 2 (two) times daily. 180  each 3   Magnesium 250 MG TABS Take 1 tablet by mouth.     omeprazole (PRILOSEC) 20 MG capsule Take 1 capsule (20 mg total) by mouth 2 (two) times daily. 180 capsule 3   Probiotic Product (PROBIOTIC PO) Take 1 capsule by mouth.     solifenacin (VESICARE) 5 MG tablet Take 1 tablet (5 mg total) by mouth daily. 30 tablet 3   tirzepatide (MOUNJARO) 7.5 MG/0.5ML Pen Inject 7.5 mg into the skin once a week. 6 mL 1   cefpodoxime (VANTIN) 100 MG tablet Take 1 tablet (100 mg total) by mouth 2 (two) times daily. 10 tablet 0   Vibegron (GEMTESA) 75 MG TABS Take 1 tablet (75 mg total) by mouth daily. 30 tablet 5   VIBERZI 100 MG TABS Take 1 tablet (100 mg total) by mouth 2 (two) times daily. 180 tablet 3   No current facility-administered medications for this visit.    Allergies as of 11/25/2023   (No Known Allergies)    Family History  Problem Relation Age of Onset   Hypertension Father    Hypertension Sister    Diabetes Sister    Hypertension Brother    Diabetes Brother    Hypertension Brother    Crohn's disease Brother    Colon cancer Neg Hx    Colon polyps Neg Hx    Esophageal cancer Neg Hx    Rectal cancer Neg Hx    Stomach cancer Neg Hx     Social History   Socioeconomic History   Marital status: Divorced    Spouse name: Not on file   Number of children: Not on file   Years of education: Not on file   Highest education level: Not on file  Occupational History   Occupation: Insurance  Tobacco Use   Smoking status: Never   Smokeless tobacco: Never  Substance and Sexual Activity   Alcohol use: No    Alcohol/week: 0.0 standard drinks of alcohol   Drug use: No   Sexual activity: Not on file  Other Topics Concern   Not on file  Social History Narrative   Not on file   Social Drivers of Health   Financial Resource Strain: Not on file  Food Insecurity: Not on file  Transportation Needs: Not on file  Physical Activity: Not on file  Stress: Not on file  Social  Connections: Not on file  Intimate Partner Violence: Not on file    Review of Systems:    Constitutional: No weight loss, fever, chills, weakness or fatigue HEENT: Eyes: No change in vision               Ears, Nose, Throat:  No change in hearing or congestion Skin: No rash or itching Cardiovascular: No chest pain, chest pressure or palpitations   Respiratory: No SOB or cough Gastrointestinal: See HPI and otherwise negative Genitourinary: No dysuria or change in urinary frequency Neurological: No headache, dizziness  or syncope Musculoskeletal: No new muscle or joint pain Hematologic: No bleeding or bruising Psychiatric: No history of depression or anxiety    Physical Exam:  Vital signs: BP 126/72   Pulse 75   Ht 5\' 6"  (1.676 m)   Wt 168 lb (76.2 kg)   BMI 27.12 kg/m   Constitutional: NAD, Well developed, Well nourished, alert and cooperative Head:  Normocephalic and atraumatic. Eyes:   PEERL, EOMI. No icterus. Conjunctiva pink. Respiratory: Respirations even and unlabored. Lungs clear to auscultation bilaterally.   No wheezes, crackles, or rhonchi.  Cardiovascular:  Regular rate and rhythm. No peripheral edema, cyanosis or pallor.  Gastrointestinal:  Soft, nondistended, nontender. No rebound or guarding. Normal bowel sounds. No appreciable masses or hepatomegaly. Rectal:  Not performed.  Msk:  Symmetrical without gross deformities. Without edema, no deformity or joint abnormality.  Neurologic:  Alert and  oriented x4;  grossly normal neurologically.  Skin:   Dry and intact without significant lesions or rashes. Psychiatric: Oriented to person, place and time. Demonstrates good judgement and reason without abnormal affect or behaviors.   RELEVANT LABS AND IMAGING: CBC    Component Value Date/Time   WBC 5.6 05/18/2021 1508   RBC 5.12 (H) 05/18/2021 1508   HGB 13.5 05/18/2021 1508   HGB 14.4 03/21/2020 1706   HCT 43.2 05/18/2021 1508   HCT 45.7 03/21/2020 1706   PLT 203  05/18/2021 1508   MCV 84.4 05/18/2021 1508   MCV 83 03/21/2020 1706   MCH 26.4 05/18/2021 1508   MCHC 31.3 05/18/2021 1508   RDW 13.5 05/18/2021 1508   RDW 13.1 03/21/2020 1706   LYMPHSABS 1.4 03/21/2020 1706   EOSABS 0.2 03/21/2020 1706   BASOSABS 0.1 03/21/2020 1706    CMP     Component Value Date/Time   NA 136 05/18/2021 1508   K 5.0 05/18/2021 1508   CL 101 05/18/2021 1508   CO2 26 05/18/2021 1508   GLUCOSE 221 (H) 05/18/2021 1508   BUN 16 05/18/2021 1508   CREATININE 1.11 (H) 05/18/2021 1508   CALCIUM 9.8 05/18/2021 1508   GFRNONAA 55 (L) 05/18/2021 1508   GFRAA >60 05/12/2020 1200     Assessment/Plan:   IBS-D Well-controlled on Viberzi.  Minimal breakthrough symptoms with occasional abdominal cramping. - Refill Viberzi for 1 year - Follow-up 1 year (or sooner if symptoms recur)  History of adenomatous polyp of colon Recall 10/29/2025   Boone Master, PA-C [facility] 11/25/2023, 3:40 PM  Cc: Merri [name], MD

## 2023-11-25 NOTE — Patient Instructions (Signed)
 We have sent the following medications to your pharmacy for you to pick up at your convenience:  Viberzi  Please follow up in one year.  _______________________________________________________  If your blood pressure at your visit was 140/90 or greater, please contact your primary care physician to follow up on this.  _______________________________________________________  If you are age 68 or older, your body mass index should be between 23-30. Your Body mass index is 27.12 kg/m. If this is out of the aforementioned range listed, please consider follow up with your Primary Care Provider.  If you are age 22 or younger, your body mass index should be between 19-25. Your Body mass index is 27.12 kg/m. If this is out of the aformentioned range listed, please consider follow up with your Primary Care Provider.   ________________________________________________________  The [facility] providers would like to encourage you to use [facility] to communicate with providers for non-urgent requests or questions.  Due to long hold times on the telephone, sending your provider a message by [facility] may be a faster and more efficient way to get a response.  Please allow 48 business hours for a response.  Please remember that this is for non-urgent requests.  _______________________________________________________

## 2023-12-02 ENCOUNTER — Ambulatory Visit (INDEPENDENT_AMBULATORY_CARE_PROVIDER_SITE_OTHER): Payer: Self-pay

## 2023-12-02 ENCOUNTER — Ambulatory Visit: Admitting: Orthopaedic Surgery

## 2023-12-02 ENCOUNTER — Other Ambulatory Visit: Payer: Self-pay

## 2023-12-02 ENCOUNTER — Telehealth: Payer: Self-pay | Admitting: Radiology

## 2023-12-02 DIAGNOSIS — M79642 Pain in left hand: Secondary | ICD-10-CM

## 2023-12-02 DIAGNOSIS — S0181XA Laceration without foreign body of other part of head, initial encounter: Secondary | ICD-10-CM

## 2023-12-02 MED ORDER — TRAMADOL HCL 50 MG PO TABS
50.0000 mg | ORAL_TABLET | Freq: Every day | ORAL | 0 refills | Status: AC | PRN
  Filled 2023-12-02: qty 20, 10d supply, fill #0

## 2023-12-02 NOTE — Telephone Encounter (Signed)
 done

## 2023-12-02 NOTE — Telephone Encounter (Signed)
 Patient asks for a prescription of tramadol to be sent to [facility].

## 2023-12-02 NOTE — Progress Notes (Signed)
 Office Visit Note   Patient: Heather Stout           Date of Birth: Feb 20, 1956           MRN: [id number] Visit Date: 12/02/2023              Requested by: Merri [name], MD [address] [place],  [place] [address] PCP: Merri [name], MD   Assessment & Plan: Visit Diagnoses:  1. Chin laceration, initial encounter   2. Pain in left hand     Plan: For the chin laceration recommend Steri-Strip and local wound care.  For the hand recommend Velcro wrist splint and symptomatic management as needed.  Questionable avulsion fracture of the carpus.  Follow-Up Instructions: No follow-ups on file.   Orders:  No orders of the defined types were placed in this encounter.  No orders of the defined types were placed in this encounter.     Procedures: No procedures performed   Clinical Data: No additional findings.   Subjective: Chief Complaint  Patient presents with  . Left Hand - Injury    DOI 12/02/2023    HPI Darica is a 68 year old female who had a mechanical fall in the parking lot this morning.  She struck her chin and face and fell on her left hand. Review of Systems   Objective: Vital Signs: There were no vitals taken for this visit.  Physical Exam  Exam of the left hand shows a superficial abrasion at the base of the palm.  She has slight swelling to the dorsal aspect.  Slight tenderness over the dorsal area.  She has mainly more pain on the ulnar side of the wrist. Exam of the chin shows a less than 1 cm transverse laceration that is hemostatic.  Specialty Comments:  No specialty comments available.  Imaging: XR Hand Complete Left Result Date: 12/02/2023 X-rays of the left hand show chondrocalcinosis within the wrist joint.  Questionable avulsion fracture from the carpus.    PMFS History: Patient Active Problem List   Diagnosis Date Noted  . Traumatic complete tear of right rotator cuff   . Protrusion of lumbar intervertebral disc  10/25/2020  . Essential hypertension 04/04/2015  . Hyperlipemia 04/04/2015  . Irritable bowel syndrome 04/04/2015  . Diabetes (HCC) 11/29/2014   Past Medical History:  Diagnosis Date  . Asthma   . Essential hypertension, benign   . GERD (gastroesophageal reflux disease)   . IBS (irritable bowel syndrome)   . Left anterior hemiblock   . Other and unspecified hyperlipidemia   . Sleep apnea    cpap  . Thyroid disease   . Type II or unspecified type diabetes mellitus without mention of complication, not stated as uncontrolled     Family History  Problem Relation Age of Onset  . Hypertension Father   . Hypertension Sister   . Diabetes Sister   . Hypertension Brother   . Diabetes Brother   . Hypertension Brother   . Crohn's disease Brother   . Colon cancer Neg Hx   . Colon polyps Neg Hx   . Esophageal cancer Neg Hx   . Rectal cancer Neg Hx   . Stomach cancer Neg Hx     Past Surgical History:  Procedure Laterality Date  . FUNCTIONAL ENDOSCOPIC SINUS SURGERY  08/26/1988  . KNEE ARTHROSCOPY Left 2013  . LUMBAR LAMINECTOMY  08/26/2000  . LUMBAR LAMINECTOMY  08/27/2003  . SHOULDER ARTHROSCOPY WITH OPEN ROTATOR CUFF REPAIR Right 05/15/2020  Procedure: right shoulder arthroscopy, debridement, mini open rotator cuff tear repair, biceps tenodesis;  Surgeon: Cammy Copa, MD;  Location: [facility];  Service: Orthopedics;  Laterality: Right;  . SHOULDER ARTHROSCOPY WITH OPEN ROTATOR CUFF REPAIR Right 05/21/2021   Procedure: RIGHT SHOULDER ARTHROSCOPY WITH OPEN ROTATOR CUFF REPAIR;  Surgeon: Cammy Copa, MD;  Location: [facility];  Service: Orthopedics;  Laterality: Right;  . TONSILLECTOMY  08/26/1978   Social History   Occupational History  . Occupation: Community education officer  Tobacco Use  . Smoking status: Never  . Smokeless tobacco: Never  Substance and Sexual Activity  . Alcohol use: No    Alcohol/week: 0.0 standard drinks of alcohol  . Drug  use: No  . Sexual activity: Not on file

## 2023-12-03 ENCOUNTER — Other Ambulatory Visit: Payer: Self-pay

## 2023-12-03 ENCOUNTER — Emergency Department (HOSPITAL_COMMUNITY)

## 2023-12-03 ENCOUNTER — Other Ambulatory Visit (HOSPITAL_COMMUNITY): Payer: Self-pay

## 2023-12-03 ENCOUNTER — Encounter (HOSPITAL_COMMUNITY): Payer: Self-pay

## 2023-12-03 ENCOUNTER — Emergency Department (HOSPITAL_COMMUNITY)
Admission: EM | Admit: 2023-12-03 | Discharge: 2023-12-03 | Disposition: A | Discharge status: Home / Self Care | Attending: Emergency Medicine | Admitting: Emergency Medicine

## 2023-12-03 DIAGNOSIS — M79642 Pain in left hand: Secondary | ICD-10-CM | POA: Diagnosis not present

## 2023-12-03 DIAGNOSIS — Z7982 Long term (current) use of aspirin: Secondary | ICD-10-CM | POA: Diagnosis not present

## 2023-12-03 DIAGNOSIS — Z794 Long term (current) use of insulin: Secondary | ICD-10-CM | POA: Insufficient documentation

## 2023-12-03 DIAGNOSIS — W01198A Fall on same level from slipping, tripping and stumbling with subsequent striking against other object, initial encounter: Secondary | ICD-10-CM | POA: Diagnosis not present

## 2023-12-03 DIAGNOSIS — S0083XA Contusion of other part of head, initial encounter: Secondary | ICD-10-CM | POA: Insufficient documentation

## 2023-12-03 DIAGNOSIS — M25512 Pain in left shoulder: Secondary | ICD-10-CM | POA: Insufficient documentation

## 2023-12-03 DIAGNOSIS — M25562 Pain in left knee: Secondary | ICD-10-CM | POA: Insufficient documentation

## 2023-12-03 DIAGNOSIS — Y990 Civilian activity done for income or pay: Secondary | ICD-10-CM | POA: Insufficient documentation

## 2023-12-03 DIAGNOSIS — S0990XA Unspecified injury of head, initial encounter: Secondary | ICD-10-CM | POA: Diagnosis present

## 2023-12-03 LAB — URINALYSIS, ROUTINE W REFLEX MICROSCOPIC
Bilirubin Urine: NEGATIVE
Glucose, UA: >=500 mg/dL — AB
Ketones, ur: NEGATIVE mg/dL
Nitrite: NEGATIVE
Protein, ur: NEGATIVE mg/dL
Specific Gravity, Urine: 1.022 (ref 1.005–1.030)
pH: 5 (ref 5.0–8.0)

## 2023-12-03 LAB — BASIC METABOLIC PANEL WITH GFR
Anion gap: 9 (ref 5–15)
BUN: 29 mg/dL — ABNORMAL HIGH (ref 8–23)
CO2: 27 mmol/L (ref 22–32)
Calcium: 9.9 mg/dL (ref 8.9–10.3)
Chloride: 100 mmol/L (ref 98–111)
Creatinine, Ser: 1.21 mg/dL — ABNORMAL HIGH (ref 0.44–1.00)
GFR, Estimated: 49 mL/min — ABNORMAL LOW (ref >60)
Glucose, Bld: 161 mg/dL — ABNORMAL HIGH (ref 70–99)
Potassium: 3.8 mmol/L (ref 3.5–5.1)
Sodium: 136 mmol/L (ref 135–145)

## 2023-12-03 LAB — CBC
HCT: 42.9 % (ref 36.0–46.0)
Hemoglobin: 13.2 g/dL (ref 12.0–15.0)
MCH: 26.8 pg (ref 26.0–34.0)
MCHC: 30.8 g/dL (ref 30.0–36.0)
MCV: 87.2 fL (ref 80.0–100.0)
Platelets: 227 10*3/uL (ref 150–400)
RBC: 4.92 MIL/uL (ref 3.87–5.11)
RDW: 13.3 % (ref 11.5–15.5)
WBC: 6.2 10*3/uL (ref 4.0–10.5)
nRBC: 0 % (ref 0.0–0.2)

## 2023-12-03 NOTE — Discharge Instructions (Addendum)
 Return if any problems.

## 2023-12-03 NOTE — ED Triage Notes (Signed)
 Pt arrived reporting fall yesterday, fell on left side. States fall was outside of work. No blood thinners. States she has been feeling dizziness intermittently for a few days. Patient  also endorses left side knee pain, hand pain from fall.

## 2023-12-04 NOTE — ED Provider Notes (Signed)
 [place] EMERGENCY DEPARTMENT AT [facility] Provider Note   CSN: [id number] Arrival date & time: 12/03/23  1021     History  Chief Complaint  Patient presents with   Fall   Knee Pain   Hand Pain        Shoulder Pain   Dizziness   Nausea    Heather Stout is a 68 y.o. female.  Patient reports that she was outside of her job and slipped and fell yesterday.  Patient works for an [employer], research (life sciences).  Patient saw Dr. Christiane Cowing who evaluated her wrist and did an x-ray.  He placed her in a splint and told her that she would need follow-up for her wrist.  Patient reports that she also hit her head.  Patient reports that she has been experiencing a headache today and dizziness.  Patient reports she has had increasing pain in her left shoulder.  Patient is unsure if she lost consciousness.  Patient thinks that she may have have hit her head hard.  Patient denies any nausea or vomiting.  The history is provided by the patient. No language interpreter was used.  Fall  Knee Pain Hand Pain  Shoulder Pain Dizziness      Home Medications Prior to Admission medications   Medication Sig Start Date End Date Taking? Authorizing Provider  albuterol (PROAIR HFA) 108 (90 Base) MCG/ACT inhaler Inhale 1 puff into the lungs every 4 (four) hours as needed. 01/31/22     aspirin 81 MG tablet Take 81 mg by mouth daily.    [provider]  Azilsartan-Chlorthalidone (EDARBYCLOR) 40-25 MG TABS Take 1 tablet by mouth daily. 10/21/23     Calcium Carbonate-Vitamin D 500-125 MG-UNIT TABS Take by mouth.    [provider]  cefpodoxime (VANTIN) 100 MG tablet Take 1 tablet (100 mg total) by mouth 2 (two) times daily. 12/27/22     cetirizine (ZYRTEC) 10 MG tablet Take 10 mg by mouth daily.    [provider]  cholecalciferol (VITAMIN D) 1000 UNITS tablet Take 2,000 Units by mouth daily.     [provider]  Continuous Glucose Sensor (FREESTYLE LIBRE 14 DAY SENSOR) MISC Apply 1  every 14 (fourteen) days. 11/12/23   Alma Jackson, NP  Continuous Glucose Sensor (FREESTYLE LIBRE 3 SENSOR) MISC Use as directed to monitor blood glucose continuously, changing the sensor every 14 days. 08/04/23   Alma Jackson, NP  Cyanocobalamin (VITAMIN B 12 PO) Take by mouth.    [provider]  Dapagliflozin Pro-metFORMIN ER (XIGDUO XR) 10-500 MG TB24 Take 1 tablet by mouth daily. 10/08/23     fenofibrate (TRICOR) 145 MG tablet Take 1 tablet (145 mg total) by mouth daily. 08/04/23     hyoscyamine (LEVBID) 0.375 MG 12 hr tablet Take 1 tablet (0.375 mg total) by mouth every 12 (twelve) hours as needed for symptoms 12/23/22   Edmonia Gottron, PA-C  insulin degludec (TRESIBA FLEXTOUCH) 200 UNIT/ML FlexTouch Pen Inject 24 Units into the skin daily. 03/17/23     insulin lispro (HUMALOG KWIKPEN) 100 UNIT/ML KwikPen Inject 10-15 Units into the skin 3 (three) times daily before meals plus correctional. Max up to 60 units daily. 12/31/22     Insulin Pen Needle 31G X 5 MM MISC Use as directed 3 to 4 times daily with insulin 05/19/23     Lifitegrast (XIIDRA) 5 % SOLN Place 1 drop into both eyes 2 (two) times daily. 05/15/22     Magnesium 250 MG  TABS Take 1 tablet by mouth.    [provider]  omeprazole (PRILOSEC) 20 MG capsule Take 1 capsule (20 mg total) by mouth 2 (two) times daily. 01/09/23     Probiotic Product (PROBIOTIC PO) Take 1 capsule by mouth.    [provider]  solifenacin (VESICARE) 5 MG tablet Take 1 tablet (5 mg total) by mouth daily. 11/24/23     tirzepatide (MOUNJARO) 7.5 MG/0.5ML Pen Inject 7.5 mg into the skin once a week. 11/24/23     traMADol (ULTRAM) 50 MG tablet Take 1-2 tablets (50-100 mg total) by mouth daily as needed. 12/02/23   Wes Hamman, MD  Vibegron (GEMTESA) 75 MG TABS Take 1 tablet (75 mg total) by mouth daily. 09/18/23     VIBERZI 100 MG TABS Take 1 tablet (100 mg total) by mouth 2 (two) times daily. 11/25/23   Garr Kalata, PA-C       Allergies    Patient has no known allergies.    Review of Systems   Review of Systems  Neurological:  Positive for dizziness.  All other systems reviewed and are negative.   Physical Exam Updated Vital Signs BP 119/68 (BP Location: Right Arm)   Pulse 68   Temp 97.9 F (36.6 C) (Oral)   Resp 16   Ht 5\' 6"  (1.676 m)   Wt 74.8 kg   SpO2 100%   BMI 26.63 kg/m  Physical Exam Vitals and nursing note reviewed.  Constitutional:      Appearance: She is well-developed.  HENT:     Head: Normocephalic.     Right Ear: External ear normal.     Left Ear: External ear normal.     Nose: Nose normal.     Mouth/Throat:     Mouth: Mucous membranes are moist.  Cardiovascular:     Rate and Rhythm: Normal rate.  Pulmonary:     Effort: Pulmonary effort is normal.  Abdominal:     General: There is no distension.  Musculoskeletal:        General: Tenderness present. Normal range of motion.     Cervical back: Normal range of motion.     Comments: Tender left shoulder to palpation pain with range of motion.  Skin:    General: Skin is warm.  Neurological:     General: No focal deficit present.     Mental Status: She is alert and oriented to person, place, and time.  Psychiatric:        Mood and Affect: Mood normal.     ED Results / Procedures / Treatments   Labs (all labs ordered are listed, but only abnormal results are displayed) Labs Reviewed  BASIC METABOLIC PANEL WITH GFR - Abnormal; Notable for the following components:      Result Value   Glucose, Bld 161 (*)    BUN 29 (*)    Creatinine, Ser 1.21 (*)    GFR, Estimated 49 (*)    All other components within normal limits  URINALYSIS, ROUTINE W REFLEX MICROSCOPIC - Abnormal; Notable for the following components:   APPearance HAZY (*)    Glucose, UA >=500 (*)    Hgb urine dipstick SMALL (*)    Leukocytes,Ua MODERATE (*)    Bacteria, UA MANY (*)    All other components within normal limits  CBC    EKG EKG  Interpretation Date/Time:  Wednesday December 03 2023 11:10:04 EDT Ventricular Rate:  75 PR Interval:  110 QRS Duration:  101  QT Interval:  545 QTC Calculation: 609 R Axis:   -44  Text Interpretation: Sinus rhythm Borderline short PR interval Left anterior fascicular block Low voltage, precordial leads Consider anterior infarct Prolonged QT interval Confirmed by Hiawatha Lout ([id number]) on 12/04/2023 1:51:56 PM  Radiology CT Head Wo Contrast Result Date: 12/03/2023 CLINICAL DATA:  Head trauma, fell yesterday onto left side, dizziness intermittently. EXAM: CT HEAD WITHOUT CONTRAST TECHNIQUE: Contiguous axial images were obtained from the base of the skull through the vertex without intravenous contrast. RADIATION DOSE REDUCTION: This exam was performed according to the departmental dose-optimization program which includes automated exposure control, adjustment of the mA and/or kV according to patient size and/or use of iterative reconstruction technique. COMPARISON:  None Available. FINDINGS: Brain: No acute intracranial hemorrhage. No CT evidence of acute infarct. No edema, mass effect, or midline shift. The basilar cisterns are patent. Ventricles: The ventricles are normal. Vascular: Atherosclerotic calcifications of the carotid siphons. No hyperdense vessel. Skull: No acute or aggressive finding. Orbits: Orbits are symmetric. Sinuses: Mucosal thickening in the ethmoid and maxillary sinuses. Air-fluid level in the right maxillary sinus. Hypoplastic appearance of the left maxillary sinus. Other: Mastoid air cells are clear. IMPRESSION: No CT evidence of acute intracranial abnormality. Paranasal sinus disease as above. Air-fluid level in the right maxillary sinus, recommend correlation for acute sinusitis. Electronically Signed   By: Denny Flack M.D.   On: 12/03/2023 16:11   DG Shoulder Left Result Date: 12/03/2023 CLINICAL DATA:  Left shoulder pain after fall. EXAM: LEFT SHOULDER - 2+ VIEW COMPARISON:   May 21, 2023. FINDINGS: There is no evidence of fracture or dislocation. There is no evidence of arthropathy or other focal bone abnormality. Soft tissues are unremarkable. IMPRESSION: Negative. Electronically Signed   By: Rosalene Colon M.D.   On: 12/03/2023 15:31    Procedures Procedures    Medications Ordered in ED Medications - No data to display  ED Course/ Medical Decision Making/ A&P                                 Medical Decision Making Patient reports she fell yesterday and hit her head.  Patient complains of a headache and dizziness today.  Patient is in a wrist splint she had an x-ray of her wrist yesterday by Dr. Christiane Cowing  Patient may have an avulsion fracture she is scheduled for follow-up with Dr. Christiane Cowing for this injury  Amount and/or Complexity of Data Reviewed Labs: ordered. Radiology: ordered and independent interpretation performed. Decision-making details documented in ED Course.    Details: CT head ordered reviewed and interpreted CT head shows no acute findings. X-ray left shoulder shows no fracture  Risk Risk Details: Patient is counseled on possible mild concussion.  Patient is advised to follow-up with her physician for recheck.  She is advised to return to the emergency department if any problems           Final Clinical Impression(s) / ED Diagnoses Final diagnoses:  Contusion of face, initial encounter  Acute pain of left shoulder    Rx / DC Orders ED Discharge Orders     None     An After Visit Summary was printed and given to the patient.     Sandi Crosby, PA-C 12/04/23 2241    Kingsley, Victoria K, DO 12/06/23 [phone]

## 2023-12-07 NOTE — Progress Notes (Signed)
 Agree with the assessment and plan as outlined by Boone Master, PA-C.  Nimrat Woolworth, DO, [facility]

## 2023-12-24 DIAGNOSIS — E1129 Type 2 diabetes mellitus with other diabetic kidney complication: Secondary | ICD-10-CM | POA: Diagnosis not present

## 2023-12-29 ENCOUNTER — Other Ambulatory Visit (HOSPITAL_COMMUNITY): Payer: Self-pay

## 2023-12-29 ENCOUNTER — Other Ambulatory Visit: Payer: Self-pay

## 2024-01-05 ENCOUNTER — Other Ambulatory Visit: Payer: Self-pay

## 2024-01-05 ENCOUNTER — Other Ambulatory Visit (HOSPITAL_COMMUNITY): Payer: Self-pay

## 2024-01-06 ENCOUNTER — Other Ambulatory Visit: Payer: Self-pay

## 2024-01-06 ENCOUNTER — Other Ambulatory Visit (HOSPITAL_COMMUNITY): Payer: Self-pay

## 2024-01-06 MED ORDER — OMEPRAZOLE 20 MG PO CPDR
20.0000 mg | DELAYED_RELEASE_CAPSULE | Freq: Two times a day (BID) | ORAL | 0 refills | Status: DC
  Filled 2024-01-06: qty 180, 90d supply, fill #0

## 2024-01-21 ENCOUNTER — Other Ambulatory Visit (HOSPITAL_COMMUNITY): Payer: Self-pay

## 2024-01-21 ENCOUNTER — Other Ambulatory Visit: Payer: Self-pay | Admitting: Physician Assistant

## 2024-01-21 ENCOUNTER — Other Ambulatory Visit: Payer: Self-pay

## 2024-01-21 DIAGNOSIS — K580 Irritable bowel syndrome with diarrhea: Secondary | ICD-10-CM

## 2024-01-21 MED ORDER — INSULIN LISPRO (1 UNIT DIAL) 100 UNIT/ML (KWIKPEN)
7.0000 [IU] | PEN_INJECTOR | Freq: Three times a day (TID) | SUBCUTANEOUS | 5 refills | Status: AC
  Filled 2024-01-21: qty 9, 15d supply, fill #0
  Filled 2024-02-16: qty 9, 15d supply, fill #1
  Filled 2024-03-18: qty 9, 15d supply, fill #2
  Filled 2024-06-08 – 2024-06-09 (×2): qty 9, 15d supply, fill #3
  Filled 2024-08-02: qty 9, 15d supply, fill #4

## 2024-01-21 MED ORDER — HYOSCYAMINE SULFATE ER 0.375 MG PO TB12
0.3750 mg | ORAL_TABLET | Freq: Two times a day (BID) | ORAL | 5 refills | Status: DC | PRN
  Filled 2024-01-21: qty 60, 30d supply, fill #0
  Filled 2024-03-18: qty 60, 30d supply, fill #1
  Filled 2024-05-24: qty 60, 30d supply, fill #2
  Filled 2024-06-28: qty 60, 30d supply, fill #3
  Filled 2024-07-26: qty 60, 30d supply, fill #4

## 2024-01-25 ENCOUNTER — Other Ambulatory Visit (HOSPITAL_COMMUNITY): Payer: Self-pay

## 2024-01-26 ENCOUNTER — Other Ambulatory Visit: Payer: Self-pay

## 2024-01-26 ENCOUNTER — Other Ambulatory Visit (HOSPITAL_COMMUNITY): Payer: Self-pay

## 2024-01-26 MED ORDER — FREESTYLE LIBRE 3 PLUS SENSOR MISC
1.0000 | 11 refills | Status: AC
  Filled 2024-01-26 – 2024-09-06 (×2): qty 2, 30d supply, fill #0

## 2024-02-02 ENCOUNTER — Other Ambulatory Visit (HOSPITAL_COMMUNITY): Payer: Self-pay

## 2024-02-03 ENCOUNTER — Other Ambulatory Visit (HOSPITAL_COMMUNITY): Payer: Self-pay

## 2024-02-03 MED ORDER — FREESTYLE LIBRE 3 PLUS SENSOR MISC
11 refills | Status: AC
  Filled 2024-02-03: qty 2, 30d supply, fill #0
  Filled 2024-03-07: qty 2, 30d supply, fill #1
  Filled 2024-04-05: qty 2, 30d supply, fill #2
  Filled 2024-05-02: qty 2, 30d supply, fill #3
  Filled 2024-06-08 – 2024-06-09 (×2): qty 2, 30d supply, fill #4
  Filled 2024-07-06: qty 2, 30d supply, fill #5
  Filled 2024-07-26 – 2024-08-02 (×2): qty 2, 30d supply, fill #6

## 2024-02-03 MED ORDER — FENOFIBRATE 145 MG PO TABS
145.0000 mg | ORAL_TABLET | Freq: Every day | ORAL | 1 refills | Status: DC
  Filled 2024-02-03: qty 90, 90d supply, fill #0
  Filled 2024-05-02: qty 90, 90d supply, fill #1

## 2024-02-04 ENCOUNTER — Other Ambulatory Visit (HOSPITAL_COMMUNITY): Payer: Self-pay

## 2024-02-10 ENCOUNTER — Other Ambulatory Visit (HOSPITAL_COMMUNITY): Payer: Self-pay

## 2024-02-10 MED ORDER — AMOXICILLIN 500 MG PO CAPS
500.0000 mg | ORAL_CAPSULE | Freq: Three times a day (TID) | ORAL | 1 refills | Status: AC
  Filled 2024-02-10: qty 30, 10d supply, fill #0
  Filled 2024-02-25: qty 30, 10d supply, fill #1

## 2024-02-16 ENCOUNTER — Other Ambulatory Visit (HOSPITAL_COMMUNITY): Payer: Self-pay

## 2024-03-18 ENCOUNTER — Other Ambulatory Visit (HOSPITAL_COMMUNITY): Payer: Self-pay

## 2024-03-18 DIAGNOSIS — R350 Frequency of micturition: Secondary | ICD-10-CM | POA: Diagnosis not present

## 2024-03-18 DIAGNOSIS — R109 Unspecified abdominal pain: Secondary | ICD-10-CM | POA: Diagnosis not present

## 2024-03-18 MED ORDER — NITROFURANTOIN MONOHYD MACRO 100 MG PO CAPS
100.0000 mg | ORAL_CAPSULE | Freq: Two times a day (BID) | ORAL | 0 refills | Status: DC
  Filled 2024-03-18: qty 6, 3d supply, fill #0

## 2024-03-29 ENCOUNTER — Other Ambulatory Visit: Payer: Self-pay

## 2024-03-29 ENCOUNTER — Other Ambulatory Visit (HOSPITAL_COMMUNITY): Payer: Self-pay

## 2024-03-30 ENCOUNTER — Other Ambulatory Visit: Payer: Self-pay

## 2024-03-30 ENCOUNTER — Other Ambulatory Visit (HOSPITAL_COMMUNITY): Payer: Self-pay

## 2024-04-05 ENCOUNTER — Other Ambulatory Visit: Payer: Self-pay

## 2024-04-05 ENCOUNTER — Other Ambulatory Visit (HOSPITAL_COMMUNITY): Payer: Self-pay

## 2024-04-05 DIAGNOSIS — Z0000 Encounter for general adult medical examination without abnormal findings: Secondary | ICD-10-CM | POA: Diagnosis not present

## 2024-04-05 DIAGNOSIS — E119 Type 2 diabetes mellitus without complications: Secondary | ICD-10-CM | POA: Diagnosis not present

## 2024-04-05 DIAGNOSIS — E785 Hyperlipidemia, unspecified: Secondary | ICD-10-CM | POA: Diagnosis not present

## 2024-04-05 DIAGNOSIS — E039 Hypothyroidism, unspecified: Secondary | ICD-10-CM | POA: Diagnosis not present

## 2024-04-05 DIAGNOSIS — I10 Essential (primary) hypertension: Secondary | ICD-10-CM | POA: Diagnosis not present

## 2024-04-05 MED ORDER — OMEPRAZOLE 20 MG PO CPDR
20.0000 mg | DELAYED_RELEASE_CAPSULE | Freq: Two times a day (BID) | ORAL | 2 refills | Status: AC
  Filled 2024-04-05: qty 180, 90d supply, fill #0
  Filled 2024-07-06: qty 180, 90d supply, fill #1

## 2024-04-08 ENCOUNTER — Other Ambulatory Visit (HOSPITAL_COMMUNITY): Payer: Self-pay

## 2024-04-08 DIAGNOSIS — J479 Bronchiectasis, uncomplicated: Secondary | ICD-10-CM | POA: Diagnosis not present

## 2024-04-08 DIAGNOSIS — N1831 Chronic kidney disease, stage 3a: Secondary | ICD-10-CM | POA: Diagnosis not present

## 2024-04-08 DIAGNOSIS — E1129 Type 2 diabetes mellitus with other diabetic kidney complication: Secondary | ICD-10-CM | POA: Diagnosis not present

## 2024-04-08 DIAGNOSIS — Z0000 Encounter for general adult medical examination without abnormal findings: Secondary | ICD-10-CM | POA: Diagnosis not present

## 2024-04-19 ENCOUNTER — Other Ambulatory Visit (HOSPITAL_COMMUNITY): Payer: Self-pay

## 2024-04-19 ENCOUNTER — Other Ambulatory Visit: Payer: Self-pay

## 2024-04-22 ENCOUNTER — Other Ambulatory Visit (HOSPITAL_COMMUNITY): Payer: Self-pay

## 2024-05-02 ENCOUNTER — Other Ambulatory Visit (HOSPITAL_COMMUNITY): Payer: Self-pay

## 2024-05-03 ENCOUNTER — Other Ambulatory Visit: Payer: Self-pay

## 2024-05-03 ENCOUNTER — Other Ambulatory Visit (HOSPITAL_COMMUNITY): Payer: Self-pay

## 2024-05-03 MED ORDER — TRESIBA FLEXTOUCH 200 UNIT/ML ~~LOC~~ SOPN
24.0000 [IU] | PEN_INJECTOR | Freq: Every day | SUBCUTANEOUS | 5 refills | Status: AC
  Filled 2024-05-03: qty 9, 75d supply, fill #0
  Filled 2024-08-09: qty 9, 75d supply, fill #1

## 2024-05-04 DIAGNOSIS — Z1231 Encounter for screening mammogram for malignant neoplasm of breast: Secondary | ICD-10-CM | POA: Diagnosis not present

## 2024-05-04 DIAGNOSIS — Z01419 Encounter for gynecological examination (general) (routine) without abnormal findings: Secondary | ICD-10-CM | POA: Diagnosis not present

## 2024-05-04 DIAGNOSIS — Z124 Encounter for screening for malignant neoplasm of cervix: Secondary | ICD-10-CM | POA: Diagnosis not present

## 2024-05-04 DIAGNOSIS — R3915 Urgency of urination: Secondary | ICD-10-CM | POA: Diagnosis not present

## 2024-05-09 ENCOUNTER — Other Ambulatory Visit (HOSPITAL_COMMUNITY): Payer: Self-pay

## 2024-05-10 ENCOUNTER — Other Ambulatory Visit (HOSPITAL_COMMUNITY): Payer: Self-pay

## 2024-05-10 MED ORDER — MOUNJARO 7.5 MG/0.5ML ~~LOC~~ SOAJ
7.5000 mg | SUBCUTANEOUS | 1 refills | Status: AC
  Filled 2024-05-10: qty 6, 84d supply, fill #0
  Filled 2024-07-26: qty 6, 84d supply, fill #1
  Filled 2024-08-09: qty 2, 28d supply, fill #1
  Filled 2024-09-06: qty 2, 28d supply, fill #2

## 2024-05-10 MED ORDER — NITROFURANTOIN MONOHYD MACRO 100 MG PO CAPS
100.0000 mg | ORAL_CAPSULE | Freq: Two times a day (BID) | ORAL | 0 refills | Status: AC
  Filled 2024-05-10: qty 10, 5d supply, fill #0

## 2024-05-12 DIAGNOSIS — Z8049 Family history of malignant neoplasm of other genital organs: Secondary | ICD-10-CM | POA: Diagnosis not present

## 2024-05-18 DIAGNOSIS — E039 Hypothyroidism, unspecified: Secondary | ICD-10-CM | POA: Diagnosis not present

## 2024-05-18 DIAGNOSIS — E1129 Type 2 diabetes mellitus with other diabetic kidney complication: Secondary | ICD-10-CM | POA: Diagnosis not present

## 2024-05-24 ENCOUNTER — Other Ambulatory Visit: Payer: Self-pay

## 2024-05-24 ENCOUNTER — Telehealth: Payer: Self-pay | Admitting: Gastroenterology

## 2024-05-24 ENCOUNTER — Other Ambulatory Visit (HOSPITAL_COMMUNITY): Payer: Self-pay

## 2024-05-24 MED ORDER — XIGDUO XR 10-500 MG PO TB24
1.0000 | ORAL_TABLET | Freq: Every day | ORAL | 5 refills | Status: AC
  Filled 2024-05-24: qty 30, 30d supply, fill #0

## 2024-05-24 NOTE — Telephone Encounter (Signed)
 Received a call from patient stating she is running low on her Viberzi  100 mg prescriptions. Please review and advise  Thank you

## 2024-05-24 NOTE — Telephone Encounter (Signed)
 Pt stated that she wanted to see when her last office visit was. Chart reviewed and noted that it was on 11/2023   Pt made aware.  Pt notified that she does have refills remaining on her Viberzi .  Pt verbalized understanding with all questions answered.

## 2024-05-25 DIAGNOSIS — N1831 Chronic kidney disease, stage 3a: Secondary | ICD-10-CM | POA: Diagnosis not present

## 2024-05-25 DIAGNOSIS — E039 Hypothyroidism, unspecified: Secondary | ICD-10-CM | POA: Diagnosis not present

## 2024-05-25 DIAGNOSIS — E1129 Type 2 diabetes mellitus with other diabetic kidney complication: Secondary | ICD-10-CM | POA: Diagnosis not present

## 2024-05-25 DIAGNOSIS — I10 Essential (primary) hypertension: Secondary | ICD-10-CM | POA: Diagnosis not present

## 2024-06-03 ENCOUNTER — Other Ambulatory Visit (HOSPITAL_COMMUNITY): Payer: Self-pay

## 2024-06-03 MED ORDER — DAPAGLIFLOZIN PROPANEDIOL 10 MG PO TABS
10.0000 mg | ORAL_TABLET | Freq: Every day | ORAL | 5 refills | Status: AC
  Filled 2024-06-03: qty 30, 30d supply, fill #0
  Filled 2024-07-06: qty 30, 30d supply, fill #1
  Filled 2024-07-07: qty 30, 30d supply, fill #0
  Filled 2024-08-02 (×2): qty 30, 30d supply, fill #1
  Filled 2024-09-06: qty 30, 30d supply, fill #2
  Filled 2024-09-06: qty 30, 30d supply, fill #0

## 2024-06-07 DIAGNOSIS — G5603 Carpal tunnel syndrome, bilateral upper limbs: Secondary | ICD-10-CM | POA: Diagnosis not present

## 2024-06-07 DIAGNOSIS — M79642 Pain in left hand: Secondary | ICD-10-CM | POA: Diagnosis not present

## 2024-06-08 ENCOUNTER — Other Ambulatory Visit (HOSPITAL_COMMUNITY): Payer: Self-pay

## 2024-06-14 ENCOUNTER — Other Ambulatory Visit: Payer: Self-pay | Admitting: Orthopedic Surgery

## 2024-06-14 DIAGNOSIS — G5603 Carpal tunnel syndrome, bilateral upper limbs: Secondary | ICD-10-CM

## 2024-06-25 ENCOUNTER — Ambulatory Visit (INDEPENDENT_AMBULATORY_CARE_PROVIDER_SITE_OTHER): Admitting: Physical Medicine and Rehabilitation

## 2024-06-25 DIAGNOSIS — R202 Paresthesia of skin: Secondary | ICD-10-CM | POA: Diagnosis not present

## 2024-06-25 NOTE — Progress Notes (Signed)
 DEKLYN Stout - 68 y.o. female MRN [id number]  Date of birth: [birth year]  Office Visit Note: Visit Date: 06/25/2024 PCP: Clarice Nottingham, MD Referred by: Arlinda Buster, MD  Subjective: Chief Complaint  Patient presents with   Left Hand - Pain, Numbness, Weakness   Right Hand - Pain, Numbness, Weakness   HPI:  [name] is a 68 y.o. female who comes in today at the request of Dr. Anshul Agarwala for evaluation and management of chronic, worsening and severe pain, numbness and tingling in the Bilateral upper extremities.  Patient is Right hand dominant.  She reports years of chronic numbness and tingling and pain in the hands right more than left.  She has noticed some weakness at times.  Clearly nocturnal complaints worse with using her hands.  Will but over the years she has used splints religiously and it did seem to help for a while but not helping now.  She has tried medications without much relief.  No actually saw Dr. Romona but not following with Dr. Arlinda.  No prior electrodiagnostic study.  No frank radicular symptoms.    ROS Otherwise per HPI.  Assessment & Plan: Visit Diagnoses:    ICD-10-CM   1. Paresthesia of skin  R20.2 NCV with EMG (electromyography)      Plan: Impression: The above electrodiagnostic study is ABNORMAL and reveals evidence of:  a moderate right median nerve entrapment at the wrist (carpal tunnel syndrome) affecting sensory and motor components.   a mild left median nerve entrapment at the wrist (carpal tunnel syndrome) affecting sensory components.   There is no significant electrodiagnostic evidence of any other focal nerve entrapment, brachial plexopathy or cervical radiculopathy.   Recommendations: 1.  Follow-up with referring physician. 2.  Continue current management of symptoms. 3.  Continue use of resting splint at night-time and as needed during the day. 4.  Suggest surgical evaluation.   Meds & Orders: No orders of the  defined types were placed in this encounter.   Orders Placed This Encounter  Procedures   NCV with EMG (electromyography)    Follow-up: Return for Anshul Agarwala, MD.   Procedures: No procedures performed  EMG & NCV Findings: Evaluation of the right median motor nerve showed decreased conduction velocity (Elbow-Wrist, 45 m/s).  The left median (across palm) sensory and the right median (across palm) sensory nerves showed prolonged distal peak latency (Wrist, L3.9, R3.9 ms).  All remaining nerves (as indicated in the following tables) were within normal limits.  All left vs. right side differences were within normal limits.    All examined muscles (as indicated in the following table) showed no evidence of electrical instability.    Impression: The above electrodiagnostic study is ABNORMAL and reveals evidence of:  a moderate right median nerve entrapment at the wrist (carpal tunnel syndrome) affecting sensory and motor components.   a mild left median nerve entrapment at the wrist (carpal tunnel syndrome) affecting sensory components.   There is no significant electrodiagnostic evidence of any other focal nerve entrapment, brachial plexopathy or cervical radiculopathy.   Recommendations: 1.  Follow-up with referring physician. 2.  Continue current management of symptoms. 3.  Continue use of resting splint at night-time and as needed during the day. 4.  Suggest surgical evaluation.  ___________________________ Prentice Eldonna BETTERS Board Certified, American Board of Physical Medicine and Rehabilitation    Nerve Conduction Studies Anti Sensory Summary Table   Stim Site NR Peak (ms) Norm Peak (ms) P-T Amp (V)  Norm P-T Amp Site1 Site2 Delta-P (ms) Dist (cm) Vel (m/s) Norm Vel (m/s)  Left Median Acr Palm Anti Sensory (2nd Digit)  31.5C  Wrist    *3.9 <3.6 24.2 >10 Wrist Palm 1.9 0.0    Palm    2.0 <2.0 24.0         Right Median Acr Palm Anti Sensory (2nd Digit)  31.8C  Wrist     *3.9 <3.6 22.1 >10 Wrist Palm 1.9 0.0    Palm    2.0 <2.0 22.9         Left Radial Anti Sensory (Base 1st Digit)  30.8C  Wrist    2.2 <3.1 31.5  Wrist Base 1st Digit 2.2 0.0    Right Radial Anti Sensory (Base 1st Digit)  31.5C  Wrist    2.2 <3.1 29.5  Wrist Base 1st Digit 2.2 0.0    Left Ulnar Anti Sensory (5th Digit)  31.6C  Wrist    3.2 <3.7 27.8 >15.0 Wrist 5th Digit 3.2 14.0 44 >38  Right Ulnar Anti Sensory (5th Digit)  32.1C  Wrist    3.0 <3.7 26.1 >15.0 Wrist 5th Digit 3.0 14.0 47 >38   Motor Summary Table   Stim Site NR Onset (ms) Norm Onset (ms) O-P Amp (mV) Norm O-P Amp Site1 Site2 Delta-0 (ms) Dist (cm) Vel (m/s) Norm Vel (m/s)  Left Median Motor (Abd Poll Brev)  31.3C  Wrist    4.0 <4.2 7.6 >5 Elbow Wrist 3.7 19.5 53 >50  Elbow    7.7  6.9         Right Median Motor (Abd Poll Brev)  31.6C  Wrist    4.1 <4.2 8.2 >5 Elbow Wrist 4.3 19.5 *45 >50  Elbow    8.4  3.5         Left Ulnar Motor (Abd Dig Min)  31.4C  Wrist    2.8 <4.2 6.6 >3 B Elbow Wrist 3.2 18.5 58 >53  B Elbow    6.0  6.4  A Elbow B Elbow 1.4 10.0 71 >53  A Elbow    7.4  7.3         Right Ulnar Motor (Abd Dig Min)  31.8C  Wrist    2.4 <4.2 7.7 >3 B Elbow Wrist 3.2 19.0 59 >53  B Elbow    5.6  7.7  A Elbow B Elbow 1.4 10.0 71 >53  A Elbow    7.0  7.1          EMG   Side Muscle Nerve Root Ins Act Fibs Psw Amp Dur Poly Recrt Int Bruna Comment  Right Abd Poll Brev Median C8-T1 Nml Nml Nml Nml Nml 0 Nml Nml   Right 1stDorInt Ulnar C8-T1 Nml Nml Nml Nml Nml 0 Nml Nml   Right PronatorTeres Median C6-7 Nml Nml Nml Nml Nml 0 Nml Nml   Right Biceps Musculocut C5-6 Nml Nml Nml Nml Nml 0 Nml Nml   Right Deltoid Axillary C5-6 Nml Nml Nml Nml Nml 0 Nml Nml     Nerve Conduction Studies Anti Sensory Left/Right Comparison   Stim Site L Lat (ms) R Lat (ms) L-R Lat (ms) L Amp (V) R Amp (V) L-R Amp (%) Site1 Site2 L Vel (m/s) R Vel (m/s) L-R Vel (m/s)  Median Acr Palm Anti Sensory (2nd Digit)  31.5C  Wrist *3.9  *3.9 0.0 24.2 22.1 8.7 Wrist Palm     Palm 2.0 2.0 0.0 24.0 22.9 4.6  Radial Anti Sensory (Base 1st Digit)  30.8C  Wrist 2.2 2.2 0.0 31.5 29.5 6.3 Wrist Base 1st Digit     Ulnar Anti Sensory (5th Digit)  31.6C  Wrist 3.2 3.0 0.2 27.8 26.1 6.1 Wrist 5th Digit 44 47 3   Motor Left/Right Comparison   Stim Site L Lat (ms) R Lat (ms) L-R Lat (ms) L Amp (mV) R Amp (mV) L-R Amp (%) Site1 Site2 L Vel (m/s) R Vel (m/s) L-R Vel (m/s)  Median Motor (Abd Poll Brev)  31.3C  Wrist 4.0 4.1 0.1 7.6 8.2 7.3 Elbow Wrist 53 *45 8  Elbow 7.7 8.4 0.7 6.9 3.5 49.3       Ulnar Motor (Abd Dig Min)  31.4C  Wrist 2.8 2.4 0.4 6.6 7.7 14.3 B Elbow Wrist 58 59 1  B Elbow 6.0 5.6 0.4 6.4 7.7 16.9 A Elbow B Elbow 71 71 0  A Elbow 7.4 7.0 0.4 7.3 7.1 2.7          Waveforms:                    Clinical History: No specialty comments available.     Objective:  VS:  HT:    WT:   BMI:     BP:   HR: bpm  TEMP: ( )  RESP:  Physical Exam Musculoskeletal:        General: Tenderness present. No swelling or deformity.     Comments: Inspection reveals no atrophy of the bilateral APB or FDI or hand intrinsics. There is no swelling, color changes, allodynia or dystrophic changes. There is 5 out of 5 strength in the bilateral wrist extension, finger abduction and long finger flexion. There is intact sensation to light touch in all dermatomal and peripheral nerve distributions. There is a positive Phalen's test bilaterally. There is a negative Hoffmann's test bilaterally.  Skin:    General: Skin is warm and dry.     Findings: No erythema or rash.  Neurological:     General: No focal deficit present.     Mental Status: She is alert and oriented to person, place, and time.     Cranial Nerves: No cranial nerve deficit.     Sensory: Sensory deficit present.     Motor: No weakness or abnormal muscle tone.     Coordination: Coordination normal.     Gait: Gait normal.  Psychiatric:        Mood and  Affect: Mood normal.        Behavior: Behavior normal.      Imaging: No results found.

## 2024-06-25 NOTE — Progress Notes (Signed)
 Pain Scale   Average Pain 0 Patient advising she has pain occ in bilateral hands however her complaint is that she has numbness, tingling and weakness in both hands. Patient is right hand dominate        +Driver, -BT, -Dye Allergies.

## 2024-06-25 NOTE — Procedures (Signed)
 EMG & NCV Findings: Evaluation of the right median motor nerve showed decreased conduction velocity (Elbow-Wrist, 45 m/s).  The left median (across palm) sensory and the right median (across palm) sensory nerves showed prolonged distal peak latency (Wrist, L3.9, R3.9 ms).  All remaining nerves (as indicated in the following tables) were within normal limits.  All left vs. right side differences were within normal limits.    All examined muscles (as indicated in the following table) showed no evidence of electrical instability.    Impression: The above electrodiagnostic study is ABNORMAL and reveals evidence of:  a moderate right median nerve entrapment at the wrist (carpal tunnel syndrome) affecting sensory and motor components.   a mild left median nerve entrapment at the wrist (carpal tunnel syndrome) affecting sensory components.   There is no significant electrodiagnostic evidence of any other focal nerve entrapment, brachial plexopathy or cervical radiculopathy.   Recommendations: 1.  Follow-up with referring physician. 2.  Continue current management of symptoms. 3.  Continue use of resting splint at night-time and as needed during the day. 4.  Suggest surgical evaluation.  ___________________________ Prentice Masters FAAPMR Board Certified, American Board of Physical Medicine and Rehabilitation    Nerve Conduction Studies Anti Sensory Summary Table   Stim Site NR Peak (ms) Norm Peak (ms) P-T Amp (V) Norm P-T Amp Site1 Site2 Delta-P (ms) Dist (cm) Vel (m/s) Norm Vel (m/s)  Left Median Acr Palm Anti Sensory (2nd Digit)  31.5C  Wrist    *3.9 <3.6 24.2 >10 Wrist Palm 1.9 0.0    Palm    2.0 <2.0 24.0         Right Median Acr Palm Anti Sensory (2nd Digit)  31.8C  Wrist    *3.9 <3.6 22.1 >10 Wrist Palm 1.9 0.0    Palm    2.0 <2.0 22.9         Left Radial Anti Sensory (Base 1st Digit)  30.8C  Wrist    2.2 <3.1 31.5  Wrist Base 1st Digit 2.2 0.0    Right Radial Anti Sensory (Base  1st Digit)  31.5C  Wrist    2.2 <3.1 29.5  Wrist Base 1st Digit 2.2 0.0    Left Ulnar Anti Sensory (5th Digit)  31.6C  Wrist    3.2 <3.7 27.8 >15.0 Wrist 5th Digit 3.2 14.0 44 >38  Right Ulnar Anti Sensory (5th Digit)  32.1C  Wrist    3.0 <3.7 26.1 >15.0 Wrist 5th Digit 3.0 14.0 47 >38   Motor Summary Table   Stim Site NR Onset (ms) Norm Onset (ms) O-P Amp (mV) Norm O-P Amp Site1 Site2 Delta-0 (ms) Dist (cm) Vel (m/s) Norm Vel (m/s)  Left Median Motor (Abd Poll Brev)  31.3C  Wrist    4.0 <4.2 7.6 >5 Elbow Wrist 3.7 19.5 53 >50  Elbow    7.7  6.9         Right Median Motor (Abd Poll Brev)  31.6C  Wrist    4.1 <4.2 8.2 >5 Elbow Wrist 4.3 19.5 *45 >50  Elbow    8.4  3.5         Left Ulnar Motor (Abd Dig Min)  31.4C  Wrist    2.8 <4.2 6.6 >3 B Elbow Wrist 3.2 18.5 58 >53  B Elbow    6.0  6.4  A Elbow B Elbow 1.4 10.0 71 >53  A Elbow    7.4  7.3         Right Ulnar Motor (  Abd Dig Min)  31.8C  Wrist    2.4 <4.2 7.7 >3 B Elbow Wrist 3.2 19.0 59 >53  B Elbow    5.6  7.7  A Elbow B Elbow 1.4 10.0 71 >53  A Elbow    7.0  7.1          EMG   Side Muscle Nerve Root Ins Act Fibs Psw Amp Dur Poly Recrt Int Bruna Comment  Right Abd Poll Brev Median C8-T1 Nml Nml Nml Nml Nml 0 Nml Nml   Right 1stDorInt Ulnar C8-T1 Nml Nml Nml Nml Nml 0 Nml Nml   Right PronatorTeres Median C6-7 Nml Nml Nml Nml Nml 0 Nml Nml   Right Biceps Musculocut C5-6 Nml Nml Nml Nml Nml 0 Nml Nml   Right Deltoid Axillary C5-6 Nml Nml Nml Nml Nml 0 Nml Nml     Nerve Conduction Studies Anti Sensory Left/Right Comparison   Stim Site L Lat (ms) R Lat (ms) L-R Lat (ms) L Amp (V) R Amp (V) L-R Amp (%) Site1 Site2 L Vel (m/s) R Vel (m/s) L-R Vel (m/s)  Median Acr Palm Anti Sensory (2nd Digit)  31.5C  Wrist *3.9 *3.9 0.0 24.2 22.1 8.7 Wrist Palm     Palm 2.0 2.0 0.0 24.0 22.9 4.6       Radial Anti Sensory (Base 1st Digit)  30.8C  Wrist 2.2 2.2 0.0 31.5 29.5 6.3 Wrist Base 1st Digit     Ulnar Anti Sensory (5th Digit)   31.6C  Wrist 3.2 3.0 0.2 27.8 26.1 6.1 Wrist 5th Digit 44 47 3   Motor Left/Right Comparison   Stim Site L Lat (ms) R Lat (ms) L-R Lat (ms) L Amp (mV) R Amp (mV) L-R Amp (%) Site1 Site2 L Vel (m/s) R Vel (m/s) L-R Vel (m/s)  Median Motor (Abd Poll Brev)  31.3C  Wrist 4.0 4.1 0.1 7.6 8.2 7.3 Elbow Wrist 53 *45 8  Elbow 7.7 8.4 0.7 6.9 3.5 49.3       Ulnar Motor (Abd Dig Min)  31.4C  Wrist 2.8 2.4 0.4 6.6 7.7 14.3 B Elbow Wrist 58 59 1  B Elbow 6.0 5.6 0.4 6.4 7.7 16.9 A Elbow B Elbow 71 71 0  A Elbow 7.4 7.0 0.4 7.3 7.1 2.7          Waveforms:

## 2024-06-28 ENCOUNTER — Encounter: Payer: Self-pay | Admitting: Radiology

## 2024-06-29 ENCOUNTER — Ambulatory Visit: Admitting: Orthopedic Surgery

## 2024-06-29 DIAGNOSIS — G5603 Carpal tunnel syndrome, bilateral upper limbs: Secondary | ICD-10-CM

## 2024-06-29 NOTE — Progress Notes (Signed)
 Heather Stout - 68 y.o. female MRN [id number]  Date of birth: 04/26/1956  Office Visit Note: Visit Date: 06/29/2024 PCP: Clarice Nottingham, MD Referred by: Clarice Nottingham, MD  Subjective: No chief complaint on file.  HPI: Heather Stout is a pleasant 68 y.o. female who presents today for evaluation of bilateral hand numbness and tingling of the radial sided digits, right greater than left.  Symptoms are present now for multiple years, worsening in nature.  She has severe nocturnal symptoms that are no longer responding to night bracing.  Is also done extensive activity modification without lasting relief.  She has undergone prior electrodiagnostic testing of the bilateral upper extremities which did show bilateral carpal tunnel syndrome, right greater than left.  Pertinent ROS were reviewed with the patient and found to be negative unless otherwise specified above in HPI.    Assessment & Plan: Visit Diagnoses:  1. Bilateral carpal tunnel syndrome     Plan: Extensive discussion was had with the patient today about her ongoing bilateral carpal tunnel syndrome that is refractory to conservative care.  Patient has both clinical and electrodiagnostic evidence to confirm this diagnosis.  Her symptoms are worse on the right side and she would like to begin with this side from a surgical standpoint.    At this juncture, she is indicated for right open versus endoscopic carpal tunnel release.  Risks and benefits of both operations were discussed in detail today.  Understanding all risks and benefits, patient would like to have surgery done in the form of right open carpal tunnel release under local anesthesia in the office setting.  Risks include but not limited to infection, bleeding, scarring, stiffness, nerve injury or vascular, tendon injury, risk of recurrence and need for subsequent operation were all discussed in detail.  Patient consented understanding the above.  Will move forward surgical  scheduling.   Follow-up: No follow-ups on file.   Meds & Orders: No orders of the defined types were placed in this encounter.  No orders of the defined types were placed in this encounter.    Procedures: No procedures performed      Clinical History: No specialty comments available.  She reports that she has never smoked. She has never used smokeless tobacco. No results for input(s): HGBA1C, LABURIC in the last 8760 hours.  Objective:   Vital Signs: There were no vitals taken for this visit.  Physical Exam  Gen: Well-appearing, in no acute distress; non-toxic CV: Regular Rate. Well-perfused. Warm.  Resp: Breathing unlabored on room air; no wheezing. Psych: Fluid speech in conversation; appropriate affect; normal thought process  Ortho Exam PHYSICAL EXAM:  General: Patient is well appearing and in no distress.   Skin and Muscle: No severe skin changes are apparent to upper extremities.   Range of Motion and Palpation Tests: Mobility is full about the elbows with flexion and extension. Forearm supination and pronation are 85/85 bilaterally.  Wrist flexion/extension is 75/65 bilaterally.  Digital flexion and extension are full.  Thumb opposition is full to the base of the small fingers bilaterally.    No cords or nodules are palpated.  No triggering is observed.    Neurologic, Vascular, Motor: Sensation is diminished to light touch in the bilateral median nerve distribution.    Thenar atrophy: Negative bilateral Tinel sign: Positive bilateral carpal Carpal tunnel compression: Positive bilateral Phalen test: Positive bilateral  Motor bilateral hand APB: 5/5 without significant thenar atrophy  Fingers pink and well perfused.  Capillary refill  is brisk.     No results found for: HGBA1C    Imaging: No results found.  Past Medical/Family/Surgical/Social History: Medications & Allergies reviewed per EMR, new medications updated. Patient Active Problem List    Diagnosis Date Noted   Traumatic complete tear of right rotator cuff    Protrusion of lumbar intervertebral disc 10/25/2020   Essential hypertension 04/04/2015   Hyperlipemia 04/04/2015   Irritable bowel syndrome 04/04/2015   Diabetes (HCC) 11/29/2014   Past Medical History:  Diagnosis Date   Asthma    Essential hypertension, benign    GERD (gastroesophageal reflux disease)    IBS (irritable bowel syndrome)    Left anterior hemiblock    Other and unspecified hyperlipidemia    Sleep apnea    cpap   Thyroid  disease    Type II or unspecified type diabetes mellitus without mention of complication, not stated as uncontrolled    Family History  Problem Relation Age of Onset   Hypertension Father    Hypertension Sister    Diabetes Sister    Hypertension Brother    Diabetes Brother    Hypertension Brother    Crohn's disease Brother    Colon cancer Neg Hx    Colon polyps Neg Hx    Esophageal cancer Neg Hx    Rectal cancer Neg Hx    Stomach cancer Neg Hx    Past Surgical History:  Procedure Laterality Date   FUNCTIONAL ENDOSCOPIC SINUS SURGERY  08/26/1988   KNEE ARTHROSCOPY Left 2013   LUMBAR LAMINECTOMY  08/26/2000   LUMBAR LAMINECTOMY  08/27/2003   SHOULDER ARTHROSCOPY WITH OPEN ROTATOR CUFF REPAIR Right 05/15/2020   Procedure: right shoulder arthroscopy, debridement, mini open rotator cuff tear repair, biceps tenodesis;  Surgeon: Addie Cordella Hamilton, MD;  Location: [facility];  Service: Orthopedics;  Laterality: Right;   SHOULDER ARTHROSCOPY WITH OPEN ROTATOR CUFF REPAIR Right 05/21/2021   Procedure: RIGHT SHOULDER ARTHROSCOPY WITH OPEN ROTATOR CUFF REPAIR;  Surgeon: Addie Cordella Hamilton, MD;  Location: [facility];  Service: Orthopedics;  Laterality: Right;   TONSILLECTOMY  08/26/1978   Social History   Occupational History   Occupation: Community Education Officer  Tobacco Use   Smoking status: Never   Smokeless tobacco: Never  Substance and Sexual  Activity   Alcohol use: No    Alcohol/week: 0.0 standard drinks of alcohol   Drug use: No   Sexual activity: Not on file    Marquan Vokes Estela) Arlinda, M.D. [facility], Hand Surgery

## 2024-06-30 ENCOUNTER — Other Ambulatory Visit: Payer: Self-pay | Admitting: Gastroenterology

## 2024-06-30 ENCOUNTER — Other Ambulatory Visit (HOSPITAL_COMMUNITY): Payer: Self-pay

## 2024-06-30 DIAGNOSIS — K580 Irritable bowel syndrome with diarrhea: Secondary | ICD-10-CM

## 2024-07-01 ENCOUNTER — Encounter (HOSPITAL_COMMUNITY): Payer: Self-pay

## 2024-07-01 ENCOUNTER — Other Ambulatory Visit (HOSPITAL_COMMUNITY): Payer: Self-pay

## 2024-07-06 ENCOUNTER — Other Ambulatory Visit: Payer: Self-pay | Admitting: Gastroenterology

## 2024-07-06 ENCOUNTER — Telehealth: Payer: Self-pay | Admitting: Gastroenterology

## 2024-07-06 ENCOUNTER — Other Ambulatory Visit (HOSPITAL_COMMUNITY): Payer: Self-pay

## 2024-07-06 DIAGNOSIS — K580 Irritable bowel syndrome with diarrhea: Secondary | ICD-10-CM

## 2024-07-06 NOTE — Telephone Encounter (Signed)
 Inbound call from patient requesting to know what is going on with her medication due to her requesting for a refill on her Viberzi  100 MG tabs. Patient stated that she is very upset because this is not the first time she has had an issue getting her medication refilled. Patient is requesting a call back. Please advise.

## 2024-07-07 ENCOUNTER — Other Ambulatory Visit: Payer: Self-pay

## 2024-07-07 ENCOUNTER — Encounter (HOSPITAL_COMMUNITY): Payer: Self-pay

## 2024-07-07 ENCOUNTER — Other Ambulatory Visit (HOSPITAL_COMMUNITY): Payer: Self-pay

## 2024-07-07 DIAGNOSIS — R053 Chronic cough: Secondary | ICD-10-CM | POA: Diagnosis not present

## 2024-07-07 DIAGNOSIS — J0100 Acute maxillary sinusitis, unspecified: Secondary | ICD-10-CM | POA: Diagnosis not present

## 2024-07-07 MED ORDER — PREDNISONE 20 MG PO TABS
20.0000 mg | ORAL_TABLET | Freq: Every day | ORAL | 0 refills | Status: AC
  Filled 2024-07-07: qty 3, 3d supply, fill #0

## 2024-07-07 MED ORDER — AMOXICILLIN-POT CLAVULANATE 875-125 MG PO TABS
1.0000 | ORAL_TABLET | Freq: Two times a day (BID) | ORAL | 0 refills | Status: AC
  Filled 2024-07-07: qty 14, 7d supply, fill #0

## 2024-07-07 MED ORDER — VIBERZI 100 MG PO TABS
100.0000 mg | ORAL_TABLET | Freq: Two times a day (BID) | ORAL | 3 refills | Status: AC
  Filled 2024-07-07: qty 180, 90d supply, fill #0

## 2024-07-08 ENCOUNTER — Other Ambulatory Visit: Payer: Self-pay

## 2024-07-08 ENCOUNTER — Other Ambulatory Visit (HOSPITAL_COMMUNITY): Payer: Self-pay

## 2024-07-09 ENCOUNTER — Other Ambulatory Visit: Payer: Self-pay

## 2024-07-12 ENCOUNTER — Other Ambulatory Visit: Payer: Self-pay

## 2024-07-12 DIAGNOSIS — G5603 Carpal tunnel syndrome, bilateral upper limbs: Secondary | ICD-10-CM

## 2024-07-20 ENCOUNTER — Ambulatory Visit: Admitting: Orthopedic Surgery

## 2024-07-20 DIAGNOSIS — G5601 Carpal tunnel syndrome, right upper limb: Secondary | ICD-10-CM

## 2024-07-20 NOTE — Progress Notes (Addendum)
 Procedure Note  Patient: Heather Stout             Date of Birth: 01-29-1956           MRN: [id number]             Visit Date: 07/20/2024  Procedures: Visit Diagnoses:  1. Carpal tunnel syndrome, right upper limb     NAME: ABELINA KETRON MEDICAL RECORD NO: [id number] DATE OF BIRTH: 05/01/1956 FACILITY: [facility] LOCATION: [facility] PHYSICIAN: GILDARDO ALDERTON, MD   OPERATIVE REPORT   DATE OF PROCEDURE: 07/20/24    PREOPERATIVE DIAGNOSIS: Right carpal tunnel syndrome   POSTOPERATIVE DIAGNOSIS: Right carpal tunnel syndrome   PROCEDURE: Right open carpal tunnel release   SURGEON:  Gildardo Alderton, M.D.   ASSISTANT: Joesph Dinsmore, OPA   ANESTHESIA:  Local   INTRAVENOUS FLUIDS:  Per anesthesia flow sheet.   ESTIMATED BLOOD LOSS:  Minimal.   COMPLICATIONS:  None.   SPECIMENS:  none   TOURNIQUET TIME: 3 minutes  DISPOSITION:  Stable   INDICATIONS: 68 year old female with clinical and electrodiagnostic evidence of right-sided carpal tunnel syndrome refractory to conservative care.  Patient was indicated for right open carpal tunnel release.  Patient elected to have surgery done in the form of right open carpal tunnel release under local anesthesia.  Risks and benefits of surgery were discussed including the risks of infection, bleeding, scarring, stiffness, nerve injury, vascular injury, tendon injury, need for subsequent operation, persistent symptoms, recurrence.  She voiced understanding of these risks and elected to proceed.  OPERATIVE COURSE: Patient was seen and identified in the preprocedure area and marked appropriately.  Surgical consent had been signed.  Patient was transferred to the procedure room and placed in supine position with the right upper extremity on an arm board.  10 cc of 1% lidocaine  with epinephrine  was utilized around the planned incisional site.  Right upper extremity was prepped and draped in normal sterile orthopedic  fashion.  A surgical pause was performed between the surgeon and staff, all were in agreement as to the patient, procedure, and site of procedure.  Tourniquet was placed and padded appropriately to the right upper arm.  The arm was exsanguinated and the tourniquet was inflated to 250 mmHg.  Longitudinal incision was designed in the thenar crease in line with the radial border of the ring finger, from intersection of Kaplan's cardinal line down to level of the distal wrist crease. Incision was carried down utilizing 15 blade. Blunt dissection was performed, palmar fascia was identified and incised sharply utilizing a Beaver blade. Careful dissection was performed down, thenar musculature was bluntly elevated and the transcarpal ligament was identified.  A Beaver blade was then utilized to divide the transcarpal ligament in a distal to proximal fashion.  Fat surrounding the palmar arch was encountered to confirm appropriate distal release.  At the level of the wrist crease, skin flaps were elevated to allow for release of the proximal portion of transverse carpal ligament as well as the antebrachial fascia into the forearm. Appropriate decompression was noted of the median nerve, care was taken to protect the nerve in its entirety throughout. Once we were satisfied with our proximal and distal release, tourniquet was deflated and bipolar electrocautery was utilized for hemostasis.  Tourniquet time was 3 minutes.  Copious irrigation was performed followed by closure utilizing 4-0 nylon in standard fashion for the skin surface. Sterile dressings were applied followed by a loosefitting soft hand dressing. Patient was subsequently taken  to the recovery area in stable condition.  Post-operative plan: The patient will recover and then be discharged home.  The patient will be non weight bearing on the right upper extremity in a soft dressing.   I will see the patient back in the office in 2 weeks for postoperative  followup.  Discharge instructions were provided for appropriate wound care, dressing maintenance and pain control.   Shraddha Lebron, MD Electronically signed, 07/20/24

## 2024-07-26 ENCOUNTER — Encounter (HOSPITAL_COMMUNITY): Payer: Self-pay

## 2024-07-26 ENCOUNTER — Other Ambulatory Visit (HOSPITAL_COMMUNITY): Payer: Self-pay

## 2024-07-26 ENCOUNTER — Other Ambulatory Visit: Payer: Self-pay

## 2024-07-26 MED ORDER — FENOFIBRATE 145 MG PO TABS
145.0000 mg | ORAL_TABLET | Freq: Every day | ORAL | 1 refills | Status: AC
  Filled 2024-07-26: qty 90, 90d supply, fill #0

## 2024-07-27 ENCOUNTER — Other Ambulatory Visit (HOSPITAL_COMMUNITY): Payer: Self-pay

## 2024-07-27 MED ORDER — EDARBYCLOR 40-25 MG PO TABS
1.0000 | ORAL_TABLET | Freq: Every day | ORAL | 2 refills | Status: AC
  Filled 2024-07-27: qty 90, 90d supply, fill #0

## 2024-07-29 ENCOUNTER — Other Ambulatory Visit (HOSPITAL_COMMUNITY): Payer: Self-pay

## 2024-08-02 ENCOUNTER — Ambulatory Visit: Admitting: Orthopedic Surgery

## 2024-08-02 ENCOUNTER — Other Ambulatory Visit (HOSPITAL_COMMUNITY): Payer: Self-pay

## 2024-08-02 ENCOUNTER — Other Ambulatory Visit: Payer: Self-pay

## 2024-08-02 DIAGNOSIS — Z9889 Other specified postprocedural states: Secondary | ICD-10-CM

## 2024-08-02 MED ORDER — INSUPEN PEN NEEDLES 31G X 5 MM MISC
Freq: Four times a day (QID) | 3 refills | Status: AC
  Filled 2024-08-02: qty 300, 75d supply, fill #0

## 2024-08-02 NOTE — Progress Notes (Signed)
   Heather Stout - 68 y.o. female MRN [id number]  Date of birth: 08-18-1956  Office Visit Note: Visit Date: 08/02/2024 PCP: Clarice Nottingham, MD Referred by: Clarice Nottingham, MD  Subjective:  HPI: Heather Stout is a 68 y.o. female who presents today for follow up 2 weeks status post right wrist open carpal tunnel release.  She is doing very well postoperatively, incision is well-healing, slight hypersensitivity in this region.  Numbness and tingling continues to improve.  Pertinent ROS were reviewed with the patient and found to be negative unless otherwise specified above in HPI.   Assessment & Plan: Visit Diagnoses: No diagnosis found.  Plan: Sutures removed today.  Wound is well-healing.  Does have some ongoing hypersensitivity at the incisional site which is quite normal.  Will allow for ongoing nerve regeneration and healing over the upcoming weeks to months.  She is welcome to return to me as needed moving forward for any issues.  Follow-up: No follow-ups on file.   Meds & Orders: No orders of the defined types were placed in this encounter.  No orders of the defined types were placed in this encounter.    Procedures: No procedures performed       Objective:   Vital Signs: There were no vitals taken for this visit.  Ortho Exam Right hand: - Well-healing palmar incision, sutures removed, skin edges well-approximated without erythema or drainage - Composite fist without restriction - Sensation intact to light touch in median nerve distribution - 5/5 APB minimal thenar atrophy    Imaging: No results found.   Heather Stout, M.D. [facility], Hand Surgery

## 2024-08-03 ENCOUNTER — Other Ambulatory Visit: Payer: Self-pay

## 2024-08-09 ENCOUNTER — Other Ambulatory Visit (HOSPITAL_COMMUNITY): Payer: Self-pay

## 2024-08-16 ENCOUNTER — Other Ambulatory Visit: Payer: Self-pay

## 2024-09-06 ENCOUNTER — Other Ambulatory Visit (HOSPITAL_COMMUNITY): Payer: Self-pay

## 2024-09-14 ENCOUNTER — Other Ambulatory Visit (HOSPITAL_COMMUNITY): Payer: Self-pay

## 2024-09-14 MED ORDER — DAPAGLIFLOZIN PROPANEDIOL 10 MG PO TABS: 10.0000 mg | ORAL_TABLET | Freq: Every day | ORAL | 1 refills | Status: AC

## 2024-09-22 ENCOUNTER — Telehealth: Payer: Self-pay | Admitting: Gastroenterology

## 2024-09-22 ENCOUNTER — Other Ambulatory Visit (HOSPITAL_COMMUNITY): Payer: Self-pay

## 2024-09-22 DIAGNOSIS — K580 Irritable bowel syndrome with diarrhea: Secondary | ICD-10-CM

## 2024-09-22 MED ORDER — HYOSCYAMINE SULFATE ER 0.375 MG PO TB12
0.3750 mg | ORAL_TABLET | Freq: Two times a day (BID) | ORAL | 5 refills | Status: AC | PRN
  Filled 2024-09-22: qty 180, 90d supply, fill #0

## 2024-09-22 MED ORDER — FREESTYLE LIBRE 3 PLUS SENSOR MISC
1 refills | Status: AC
  Filled 2024-09-22: qty 6, 90d supply, fill #0

## 2024-09-22 MED ORDER — MOUNJARO 7.5 MG/0.5ML ~~LOC~~ SOAJ
7.5000 mg | SUBCUTANEOUS | 1 refills | Status: AC
  Filled 2024-09-22: qty 6, 84d supply, fill #0

## 2024-09-22 NOTE — Telephone Encounter (Signed)
 Rx for hyoscyamine  sent to [facility] as requested.

## 2024-09-22 NOTE — Telephone Encounter (Signed)
 Please advise if OK to send 90 supply of hyoscyamine  to pharmacy.  Patient is scheduled for 1 year follow up on 10-22-24.  Thank you

## 2024-09-22 NOTE — Telephone Encounter (Signed)
 Inbound call from patient stating [facility] has requested her to have a 90 supply of hyoscyamine  and patient states she would need new prescription sent to pharmacy  Please advise  Thank you

## 2024-09-23 ENCOUNTER — Other Ambulatory Visit: Payer: Self-pay | Admitting: Orthopedic Surgery

## 2024-09-23 ENCOUNTER — Other Ambulatory Visit (HOSPITAL_COMMUNITY): Payer: Self-pay

## 2024-09-23 DIAGNOSIS — Z9889 Other specified postprocedural states: Secondary | ICD-10-CM

## 2024-09-29 ENCOUNTER — Encounter: Admitting: Rehabilitative and Restorative Service Providers"

## 2024-09-30 ENCOUNTER — Encounter (HOSPITAL_COMMUNITY): Payer: Self-pay

## 2024-09-30 ENCOUNTER — Other Ambulatory Visit (HOSPITAL_COMMUNITY): Payer: Self-pay

## 2024-09-30 MED ORDER — INSULIN LISPRO (1 UNIT DIAL) 100 UNIT/ML (KWIKPEN)
20.0000 [IU] | PEN_INJECTOR | Freq: Three times a day (TID) | SUBCUTANEOUS | 1 refills | Status: AC
  Filled 2024-09-30: qty 27, 45d supply, fill #0

## 2024-09-30 MED ORDER — TRESIBA FLEXTOUCH 200 UNIT/ML ~~LOC~~ SOPN
24.0000 [IU] | PEN_INJECTOR | Freq: Every day | SUBCUTANEOUS | 1 refills | Status: AC
  Filled 2024-09-30: qty 9, 75d supply, fill #0

## 2024-10-01 ENCOUNTER — Other Ambulatory Visit: Payer: Self-pay

## 2024-10-05 ENCOUNTER — Encounter: Admitting: Rehabilitative and Restorative Service Providers"

## 2024-10-22 ENCOUNTER — Ambulatory Visit: Admitting: Gastroenterology
